# Patient Record
Sex: Female | Born: 1935 | Race: White | Hispanic: No | Smoking: Never smoker
Health system: Southern US, Community
[De-identification: ages and names within clinical notes are randomized; demographics above are authoritative.]

## PROBLEM LIST (undated history)

## (undated) DIAGNOSIS — Z9889 Other specified postprocedural states: Secondary | ICD-10-CM

## (undated) DIAGNOSIS — I447 Left bundle-branch block, unspecified: Secondary | ICD-10-CM

## (undated) DIAGNOSIS — R0789 Other chest pain: Secondary | ICD-10-CM

## (undated) DIAGNOSIS — N390 Urinary tract infection, site not specified: Secondary | ICD-10-CM

## (undated) DIAGNOSIS — K5730 Diverticulosis of large intestine without perforation or abscess without bleeding: Secondary | ICD-10-CM

## (undated) DIAGNOSIS — I10 Essential (primary) hypertension: Secondary | ICD-10-CM

## (undated) DIAGNOSIS — R112 Nausea with vomiting, unspecified: Secondary | ICD-10-CM

## (undated) DIAGNOSIS — I739 Peripheral vascular disease, unspecified: Secondary | ICD-10-CM

## (undated) DIAGNOSIS — IMO0002 Reserved for concepts with insufficient information to code with codable children: Secondary | ICD-10-CM

## (undated) DIAGNOSIS — I472 Ventricular tachycardia: Secondary | ICD-10-CM

## (undated) DIAGNOSIS — T8859XA Other complications of anesthesia, initial encounter: Secondary | ICD-10-CM

## (undated) DIAGNOSIS — E785 Hyperlipidemia, unspecified: Secondary | ICD-10-CM

## (undated) DIAGNOSIS — M1990 Unspecified osteoarthritis, unspecified site: Secondary | ICD-10-CM

## (undated) DIAGNOSIS — I2699 Other pulmonary embolism without acute cor pulmonale: Secondary | ICD-10-CM

## (undated) DIAGNOSIS — K529 Noninfective gastroenteritis and colitis, unspecified: Secondary | ICD-10-CM

## (undated) DIAGNOSIS — R000 Tachycardia, unspecified: Secondary | ICD-10-CM

## (undated) DIAGNOSIS — K219 Gastro-esophageal reflux disease without esophagitis: Secondary | ICD-10-CM

## (undated) DIAGNOSIS — E876 Hypokalemia: Secondary | ICD-10-CM

## (undated) DIAGNOSIS — T4145XA Adverse effect of unspecified anesthetic, initial encounter: Secondary | ICD-10-CM

## (undated) DIAGNOSIS — R9430 Abnormal result of cardiovascular function study, unspecified: Secondary | ICD-10-CM

## (undated) DIAGNOSIS — R0602 Shortness of breath: Secondary | ICD-10-CM

## (undated) DIAGNOSIS — M353 Polymyalgia rheumatica: Secondary | ICD-10-CM

## (undated) HISTORY — DX: Ventricular tachycardia: I47.2

## (undated) HISTORY — DX: Tachycardia, unspecified: R00.0

## (undated) HISTORY — PX: BACK SURGERY: SHX140

## (undated) HISTORY — PX: TOTAL ABDOMINAL HYSTERECTOMY: SHX209

## (undated) HISTORY — PX: TONSILLECTOMY: SUR1361

## (undated) HISTORY — DX: Diverticulosis of large intestine without perforation or abscess without bleeding: K57.30

## (undated) HISTORY — DX: Left bundle-branch block, unspecified: I44.7

## (undated) HISTORY — DX: Gastro-esophageal reflux disease without esophagitis: K21.9

## (undated) HISTORY — DX: Essential (primary) hypertension: I10

## (undated) HISTORY — DX: Hypokalemia: E87.6

## (undated) HISTORY — PX: VEIN LIGATION AND STRIPPING: SHX2653

## (undated) HISTORY — PX: CHOLECYSTECTOMY: SHX55

## (undated) HISTORY — PX: OTHER SURGICAL HISTORY: SHX169

## (undated) HISTORY — DX: Other chest pain: R07.89

## (undated) HISTORY — DX: Unspecified osteoarthritis, unspecified site: M19.90

## (undated) HISTORY — DX: Urinary tract infection, site not specified: N39.0

## (undated) HISTORY — DX: Polymyalgia rheumatica: M35.3

## (undated) HISTORY — DX: Reserved for concepts with insufficient information to code with codable children: IMO0002

## (undated) HISTORY — DX: Abnormal result of cardiovascular function study, unspecified: R94.30

## (undated) HISTORY — DX: Hyperlipidemia, unspecified: E78.5

## (undated) HISTORY — DX: Peripheral vascular disease, unspecified: I73.9

---

## 1991-12-20 HISTORY — PX: APPENDECTOMY: SHX54

## 1991-12-20 HISTORY — PX: OTHER SURGICAL HISTORY: SHX169

## 1991-12-20 HISTORY — PX: COLECTOMY: SHX59

## 1998-09-21 ENCOUNTER — Encounter: Payer: Self-pay | Admitting: Emergency Medicine

## 1998-09-21 ENCOUNTER — Inpatient Hospital Stay (HOSPITAL_COMMUNITY): Admission: EM | Admit: 1998-09-21 | Discharge: 1998-09-25 | Payer: Self-pay | Admitting: Emergency Medicine

## 1999-01-04 ENCOUNTER — Emergency Department (HOSPITAL_COMMUNITY): Admission: EM | Admit: 1999-01-04 | Discharge: 1999-01-05 | Payer: Self-pay | Admitting: Emergency Medicine

## 1999-01-05 ENCOUNTER — Encounter: Payer: Self-pay | Admitting: Emergency Medicine

## 1999-05-05 ENCOUNTER — Ambulatory Visit (HOSPITAL_COMMUNITY): Admission: RE | Admit: 1999-05-05 | Discharge: 1999-05-05 | Payer: Self-pay | Admitting: Gastroenterology

## 2000-05-19 LAB — FECAL OCCULT BLOOD, GUAIAC: Fecal Occult Blood: NEGATIVE

## 2000-11-22 ENCOUNTER — Ambulatory Visit (HOSPITAL_COMMUNITY): Admission: RE | Admit: 2000-11-22 | Discharge: 2000-11-22 | Payer: Self-pay | Admitting: Gastroenterology

## 2000-11-22 ENCOUNTER — Encounter (INDEPENDENT_AMBULATORY_CARE_PROVIDER_SITE_OTHER): Payer: Self-pay | Admitting: *Deleted

## 2002-09-26 ENCOUNTER — Encounter: Admission: RE | Admit: 2002-09-26 | Discharge: 2002-09-26 | Payer: Self-pay | Admitting: Gastroenterology

## 2002-09-26 ENCOUNTER — Encounter: Payer: Self-pay | Admitting: Gastroenterology

## 2002-12-04 ENCOUNTER — Ambulatory Visit (HOSPITAL_COMMUNITY): Admission: RE | Admit: 2002-12-04 | Discharge: 2002-12-04 | Payer: Self-pay | Admitting: Gastroenterology

## 2002-12-04 ENCOUNTER — Encounter (INDEPENDENT_AMBULATORY_CARE_PROVIDER_SITE_OTHER): Payer: Self-pay | Admitting: *Deleted

## 2003-12-20 HISTORY — PX: CARDIAC CATHETERIZATION: SHX172

## 2004-05-07 ENCOUNTER — Encounter: Admission: RE | Admit: 2004-05-07 | Discharge: 2004-05-07 | Payer: Self-pay | Admitting: Gastroenterology

## 2004-05-19 HISTORY — PX: ESOPHAGOGASTRODUODENOSCOPY: SHX1529

## 2004-07-06 ENCOUNTER — Ambulatory Visit (HOSPITAL_COMMUNITY): Admission: RE | Admit: 2004-07-06 | Discharge: 2004-07-06 | Payer: Self-pay | Admitting: Gastroenterology

## 2004-07-13 ENCOUNTER — Ambulatory Visit (HOSPITAL_COMMUNITY): Admission: RE | Admit: 2004-07-13 | Discharge: 2004-07-13 | Payer: Self-pay | Admitting: Gastroenterology

## 2004-09-23 ENCOUNTER — Observation Stay (HOSPITAL_COMMUNITY): Admission: EM | Admit: 2004-09-23 | Discharge: 2004-09-24 | Payer: Self-pay | Admitting: Emergency Medicine

## 2004-09-24 ENCOUNTER — Encounter: Payer: Self-pay | Admitting: Cardiology

## 2004-10-08 ENCOUNTER — Ambulatory Visit (HOSPITAL_COMMUNITY): Admission: RE | Admit: 2004-10-08 | Discharge: 2004-10-08 | Payer: Self-pay | Admitting: Cardiology

## 2004-10-18 ENCOUNTER — Encounter: Admission: RE | Admit: 2004-10-18 | Discharge: 2004-10-18 | Payer: Self-pay | Admitting: Surgery

## 2004-10-18 ENCOUNTER — Ambulatory Visit: Admission: RE | Admit: 2004-10-18 | Discharge: 2004-10-18 | Payer: Self-pay | Admitting: Cardiology

## 2004-10-18 LAB — HM MAMMOGRAPHY: HM Mammogram: NORMAL

## 2004-10-28 ENCOUNTER — Ambulatory Visit: Payer: Self-pay | Admitting: Cardiology

## 2004-11-23 ENCOUNTER — Ambulatory Visit: Payer: Self-pay | Admitting: Family Medicine

## 2004-12-27 ENCOUNTER — Ambulatory Visit: Payer: Self-pay | Admitting: Family Medicine

## 2004-12-29 ENCOUNTER — Ambulatory Visit: Payer: Self-pay | Admitting: Family Medicine

## 2005-01-17 ENCOUNTER — Ambulatory Visit (HOSPITAL_COMMUNITY): Admission: RE | Admit: 2005-01-17 | Discharge: 2005-01-17 | Payer: Self-pay | Admitting: Oncology

## 2005-01-25 ENCOUNTER — Ambulatory Visit: Payer: Self-pay | Admitting: Family Medicine

## 2005-04-18 ENCOUNTER — Ambulatory Visit: Payer: Self-pay | Admitting: Oncology

## 2005-04-19 ENCOUNTER — Ambulatory Visit (HOSPITAL_COMMUNITY): Admission: RE | Admit: 2005-04-19 | Discharge: 2005-04-19 | Payer: Self-pay | Admitting: Oncology

## 2005-06-14 ENCOUNTER — Ambulatory Visit: Payer: Self-pay | Admitting: Family Medicine

## 2005-06-16 ENCOUNTER — Ambulatory Visit: Payer: Self-pay | Admitting: Family Medicine

## 2005-07-21 ENCOUNTER — Ambulatory Visit: Payer: Self-pay | Admitting: Oncology

## 2005-08-04 ENCOUNTER — Ambulatory Visit (HOSPITAL_COMMUNITY): Admission: RE | Admit: 2005-08-04 | Discharge: 2005-08-04 | Payer: Self-pay | Admitting: Oncology

## 2005-10-05 ENCOUNTER — Ambulatory Visit: Payer: Self-pay | Admitting: Family Medicine

## 2005-12-26 ENCOUNTER — Ambulatory Visit: Payer: Self-pay | Admitting: Family Medicine

## 2005-12-29 ENCOUNTER — Ambulatory Visit: Payer: Self-pay | Admitting: Family Medicine

## 2006-03-09 ENCOUNTER — Ambulatory Visit: Payer: Self-pay | Admitting: Family Medicine

## 2006-04-04 ENCOUNTER — Ambulatory Visit: Payer: Self-pay | Admitting: Family Medicine

## 2006-10-03 ENCOUNTER — Ambulatory Visit: Payer: Self-pay | Admitting: Family Medicine

## 2006-10-10 ENCOUNTER — Ambulatory Visit: Payer: Self-pay | Admitting: Family Medicine

## 2007-01-08 ENCOUNTER — Ambulatory Visit: Payer: Self-pay | Admitting: Family Medicine

## 2007-01-08 LAB — CONVERTED CEMR LAB
Hgb A1c MFr Bld: 6.4 %
Hgb A1c MFr Bld: 6.4 % — ABNORMAL HIGH (ref 4.6–6.0)
Total CK: 44 units/L (ref 7–177)

## 2007-01-30 ENCOUNTER — Ambulatory Visit: Payer: Self-pay | Admitting: Family Medicine

## 2007-01-30 LAB — CONVERTED CEMR LAB
Anti Nuclear Antibody(ANA): NEGATIVE
Basophils Absolute: 0.1 10*3/uL (ref 0.0–0.1)
Basophils Relative: 1.3 % — ABNORMAL HIGH (ref 0.0–1.0)
Eosinophils Absolute: 0.2 10*3/uL (ref 0.0–0.6)
Eosinophils Relative: 1.6 % (ref 0.0–5.0)
HCT: 32.3 % — ABNORMAL LOW (ref 36.0–46.0)
Hemoglobin: 11 g/dL — ABNORMAL LOW (ref 12.0–15.0)
Lymphocytes Relative: 11.4 % — ABNORMAL LOW (ref 12.0–46.0)
MCHC: 34 g/dL (ref 30.0–36.0)
MCV: 87.2 fL (ref 78.0–100.0)
Monocytes Absolute: 0.4 10*3/uL (ref 0.2–0.7)
Monocytes Relative: 3.7 % (ref 3.0–11.0)
Neutro Abs: 7.7 10*3/uL (ref 1.4–7.7)
Neutrophils Relative %: 82 % — ABNORMAL HIGH (ref 43.0–77.0)
Platelets: 581 10*3/uL — ABNORMAL HIGH (ref 150–400)
RBC: 3.71 M/uL — ABNORMAL LOW (ref 3.87–5.11)
RDW: 12.9 % (ref 11.5–14.6)
Rheumatoid fact SerPl-aCnc: <20 intl units/mL — ABNORMAL LOW (ref 0.0–20.0)
Sed Rate: 114 mm/hr — ABNORMAL HIGH (ref 0–25)
WBC: 9.5 10*3/uL (ref 4.5–10.5)

## 2007-02-07 ENCOUNTER — Ambulatory Visit: Payer: Self-pay | Admitting: Family Medicine

## 2007-02-23 ENCOUNTER — Ambulatory Visit: Payer: Self-pay | Admitting: Family Medicine

## 2007-03-06 ENCOUNTER — Ambulatory Visit: Payer: Self-pay | Admitting: Family Medicine

## 2007-04-24 ENCOUNTER — Encounter: Payer: Self-pay | Admitting: Family Medicine

## 2007-04-25 ENCOUNTER — Encounter: Payer: Self-pay | Admitting: Family Medicine

## 2007-04-25 DIAGNOSIS — I10 Essential (primary) hypertension: Secondary | ICD-10-CM | POA: Insufficient documentation

## 2007-04-25 DIAGNOSIS — I739 Peripheral vascular disease, unspecified: Secondary | ICD-10-CM

## 2007-04-25 DIAGNOSIS — M353 Polymyalgia rheumatica: Secondary | ICD-10-CM | POA: Insufficient documentation

## 2007-04-25 DIAGNOSIS — M1990 Unspecified osteoarthritis, unspecified site: Secondary | ICD-10-CM

## 2007-04-25 DIAGNOSIS — K5730 Diverticulosis of large intestine without perforation or abscess without bleeding: Secondary | ICD-10-CM | POA: Insufficient documentation

## 2007-04-25 DIAGNOSIS — K589 Irritable bowel syndrome without diarrhea: Secondary | ICD-10-CM

## 2007-04-25 DIAGNOSIS — D126 Benign neoplasm of colon, unspecified: Secondary | ICD-10-CM

## 2007-04-25 DIAGNOSIS — E785 Hyperlipidemia, unspecified: Secondary | ICD-10-CM

## 2007-05-07 ENCOUNTER — Ambulatory Visit: Payer: Self-pay | Admitting: Family Medicine

## 2007-05-08 LAB — CONVERTED CEMR LAB
Basophils Absolute: 0.1 10*3/uL (ref 0.0–0.1)
Basophils Relative: 0.5 % (ref 0.0–1.0)
Eosinophils Absolute: 0.1 10*3/uL (ref 0.0–0.6)
Eosinophils Relative: 0.4 % (ref 0.0–5.0)
HCT: 37 % (ref 36.0–46.0)
Hemoglobin: 12.6 g/dL (ref 12.0–15.0)
Hgb A1c MFr Bld: 6.4 % — ABNORMAL HIGH (ref 4.6–6.0)
Lymphocytes Relative: 7.1 % — ABNORMAL LOW (ref 12.0–46.0)
MCHC: 34.1 g/dL (ref 30.0–36.0)
MCV: 87.3 fL (ref 78.0–100.0)
Monocytes Absolute: 0.4 10*3/uL (ref 0.2–0.7)
Monocytes Relative: 2 % — ABNORMAL LOW (ref 3.0–11.0)
Neutro Abs: 15.9 10*3/uL — ABNORMAL HIGH (ref 1.4–7.7)
Neutrophils Relative %: 90 % — ABNORMAL HIGH (ref 43.0–77.0)
Platelets: 384 10*3/uL (ref 150–400)
RBC: 4.24 M/uL (ref 3.87–5.11)
RDW: 15.6 % — ABNORMAL HIGH (ref 11.5–14.6)
WBC: 17.8 10*3/uL — ABNORMAL HIGH (ref 4.5–10.5)

## 2007-05-16 ENCOUNTER — Encounter: Admission: RE | Admit: 2007-05-16 | Discharge: 2007-05-16 | Payer: Self-pay | Admitting: Rheumatology

## 2007-05-16 ENCOUNTER — Encounter: Payer: Self-pay | Admitting: Family Medicine

## 2007-06-05 ENCOUNTER — Ambulatory Visit: Payer: Self-pay | Admitting: Family Medicine

## 2007-06-05 LAB — CONVERTED CEMR LAB
Casts: 0 /lpf
Yeast, UA: 0

## 2007-06-06 ENCOUNTER — Encounter: Payer: Self-pay | Admitting: Family Medicine

## 2007-06-12 ENCOUNTER — Encounter: Payer: Self-pay | Admitting: Family Medicine

## 2007-06-18 ENCOUNTER — Encounter: Payer: Self-pay | Admitting: Family Medicine

## 2007-07-03 ENCOUNTER — Encounter: Payer: Self-pay | Admitting: Family Medicine

## 2007-07-05 ENCOUNTER — Encounter: Payer: Self-pay | Admitting: Family Medicine

## 2007-07-06 ENCOUNTER — Encounter: Admission: RE | Admit: 2007-07-06 | Discharge: 2007-07-06 | Payer: Self-pay | Admitting: Physician Assistant

## 2007-07-25 ENCOUNTER — Ambulatory Visit: Payer: Self-pay | Admitting: Family Medicine

## 2007-07-25 LAB — CONVERTED CEMR LAB
Bilirubin Urine: NEGATIVE
Blood in Urine, dipstick: NEGATIVE
Glucose, Urine, Semiquant: NEGATIVE
Ketones, urine, test strip: NEGATIVE
Nitrite: NEGATIVE
Protein, U semiquant: NEGATIVE
Specific Gravity, Urine: 1.01
Urobilinogen, UA: 0.2
WBC Urine, dipstick: NEGATIVE
pH: 6

## 2007-08-02 ENCOUNTER — Encounter: Payer: Self-pay | Admitting: Family Medicine

## 2007-08-16 ENCOUNTER — Encounter: Payer: Self-pay | Admitting: Family Medicine

## 2007-08-22 ENCOUNTER — Ambulatory Visit: Payer: Self-pay | Admitting: Family Medicine

## 2007-08-22 ENCOUNTER — Telehealth: Payer: Self-pay | Admitting: Family Medicine

## 2007-08-22 LAB — CONVERTED CEMR LAB
Bilirubin Urine: NEGATIVE
Glucose, Urine, Semiquant: NEGATIVE
Ketones, urine, test strip: NEGATIVE
Nitrite: NEGATIVE
Protein, U semiquant: 100
Specific Gravity, Urine: >=1.03
Urobilinogen, UA: 0.2
pH: 6

## 2007-08-23 ENCOUNTER — Encounter: Payer: Self-pay | Admitting: Family Medicine

## 2007-09-18 ENCOUNTER — Encounter: Payer: Self-pay | Admitting: Family Medicine

## 2007-09-21 ENCOUNTER — Ambulatory Visit: Payer: Self-pay | Admitting: Family Medicine

## 2007-10-25 ENCOUNTER — Encounter: Payer: Self-pay | Admitting: Family Medicine

## 2007-12-24 ENCOUNTER — Ambulatory Visit: Payer: Self-pay | Admitting: Family Medicine

## 2008-01-04 ENCOUNTER — Encounter: Payer: Self-pay | Admitting: Family Medicine

## 2008-01-07 ENCOUNTER — Encounter: Payer: Self-pay | Admitting: Family Medicine

## 2008-02-21 ENCOUNTER — Encounter: Payer: Self-pay | Admitting: Family Medicine

## 2008-04-10 ENCOUNTER — Encounter: Payer: Self-pay | Admitting: Family Medicine

## 2008-06-23 ENCOUNTER — Ambulatory Visit: Payer: Self-pay | Admitting: Family Medicine

## 2008-06-23 DIAGNOSIS — R5381 Other malaise: Secondary | ICD-10-CM

## 2008-06-23 DIAGNOSIS — R5383 Other fatigue: Secondary | ICD-10-CM

## 2008-06-24 LAB — CONVERTED CEMR LAB
ALT: 15 units/L (ref 0–35)
AST: 17 units/L (ref 0–37)
Albumin: 3.6 g/dL (ref 3.5–5.2)
Alkaline Phosphatase: 55 units/L (ref 39–117)
BUN: 18 mg/dL (ref 6–23)
Basophils Absolute: 0 10*3/uL (ref 0.0–0.1)
Basophils Relative: 0 % (ref 0.0–1.0)
Bilirubin, Direct: 0.1 mg/dL (ref 0.0–0.3)
CO2: 29 meq/L (ref 19–32)
Calcium: 9 mg/dL (ref 8.4–10.5)
Chloride: 104 meq/L (ref 96–112)
Cholesterol: 209 mg/dL (ref 0–200)
Creatinine, Ser: 0.9 mg/dL (ref 0.4–1.2)
Direct LDL: 127.1 mg/dL
Eosinophils Absolute: 0.1 10*3/uL (ref 0.0–0.7)
Eosinophils Relative: 1.3 % (ref 0.0–5.0)
Folate: 13 ng/mL
GFR calc Af Amer: 77 mL/min
GFR calc non Af Amer: 63 mL/min
Glucose, Bld: 89 mg/dL (ref 70–99)
HCT: 35 % — ABNORMAL LOW (ref 36.0–46.0)
HDL: 60.1 mg/dL (ref >39.0)
Hemoglobin: 12 g/dL (ref 12.0–15.0)
Hgb A1c MFr Bld: 6.2 % — ABNORMAL HIGH (ref 4.6–6.0)
Lymphocytes Relative: 18.5 % (ref 12.0–46.0)
MCHC: 34.3 g/dL (ref 30.0–36.0)
MCV: 93.8 fL (ref 78.0–100.0)
Monocytes Absolute: 0.6 10*3/uL (ref 0.1–1.0)
Monocytes Relative: 7.8 % (ref 3.0–12.0)
Neutro Abs: 5.9 10*3/uL (ref 1.4–7.7)
Neutrophils Relative %: 72.4 % (ref 43.0–77.0)
Phosphorus: 3.5 mg/dL (ref 2.3–4.6)
Platelets: 310 10*3/uL (ref 150–400)
Potassium: 3.9 meq/L (ref 3.5–5.1)
RBC: 3.73 M/uL — ABNORMAL LOW (ref 3.87–5.11)
RDW: 13.6 % (ref 11.5–14.6)
Sed Rate: 47 mm/hr — ABNORMAL HIGH (ref 0–22)
Sodium: 142 meq/L (ref 135–145)
TSH: 0.92 microintl units/mL (ref 0.35–5.50)
Total Bilirubin: 0.7 mg/dL (ref 0.3–1.2)
Total CHOL/HDL Ratio: 3.5
Total Protein: 6.7 g/dL (ref 6.0–8.3)
Triglycerides: 96 mg/dL (ref 0–149)
VLDL: 19 mg/dL (ref 0–40)
Vitamin B-12: 383 pg/mL (ref 211–911)
WBC: 8.1 10*3/uL (ref 4.5–10.5)

## 2008-06-25 ENCOUNTER — Encounter: Payer: Self-pay | Admitting: Family Medicine

## 2008-06-25 LAB — CONVERTED CEMR LAB: Vit D, 1,25-Dihydroxy: 23 — ABNORMAL LOW (ref 30–89)

## 2008-07-03 ENCOUNTER — Ambulatory Visit: Payer: Self-pay | Admitting: Family Medicine

## 2008-07-03 DIAGNOSIS — B009 Herpesviral infection, unspecified: Secondary | ICD-10-CM | POA: Insufficient documentation

## 2008-07-03 LAB — CONVERTED CEMR LAB
Bilirubin Urine: NEGATIVE
Glucose, Urine, Semiquant: NEGATIVE
Ketones, urine, test strip: NEGATIVE
Nitrite: POSITIVE
Protein, U semiquant: 30
Specific Gravity, Urine: 1.02
Urobilinogen, UA: 0.2
pH: 5

## 2008-07-14 ENCOUNTER — Ambulatory Visit: Payer: Self-pay | Admitting: Family Medicine

## 2008-07-14 ENCOUNTER — Encounter (INDEPENDENT_AMBULATORY_CARE_PROVIDER_SITE_OTHER): Payer: Self-pay | Admitting: Internal Medicine

## 2008-09-10 ENCOUNTER — Ambulatory Visit: Payer: Self-pay | Admitting: Family Medicine

## 2008-09-10 DIAGNOSIS — E559 Vitamin D deficiency, unspecified: Secondary | ICD-10-CM

## 2008-09-11 ENCOUNTER — Encounter: Payer: Self-pay | Admitting: Family Medicine

## 2008-09-12 LAB — CONVERTED CEMR LAB
Albumin: 3.7 g/dL (ref 3.5–5.2)
BUN: 22 mg/dL (ref 6–23)
CO2: 27 meq/L (ref 19–32)
Calcium: 9.3 mg/dL (ref 8.4–10.5)
Chloride: 110 meq/L (ref 96–112)
Creatinine, Ser: 0.8 mg/dL (ref 0.4–1.2)
GFR calc Af Amer: 88 mL/min
GFR calc non Af Amer: 72 mL/min
Glucose, Bld: 97 mg/dL (ref 70–99)
Phosphorus: 4 mg/dL (ref 2.3–4.6)
Potassium: 4.1 meq/L (ref 3.5–5.1)
Sodium: 142 meq/L (ref 135–145)
Vit D, 1,25-Dihydroxy: 31 (ref 30–89)

## 2008-09-22 ENCOUNTER — Ambulatory Visit: Payer: Self-pay | Admitting: Family Medicine

## 2008-09-22 DIAGNOSIS — N390 Urinary tract infection, site not specified: Secondary | ICD-10-CM

## 2008-09-22 LAB — CONVERTED CEMR LAB
Bilirubin Urine: NEGATIVE
Blood in Urine, dipstick: NEGATIVE
Casts: 0 /lpf
Glucose, Urine, Semiquant: NEGATIVE
Ketones, urine, test strip: NEGATIVE
Nitrite: NEGATIVE
Protein, U semiquant: NEGATIVE
RBC / HPF: 0
Specific Gravity, Urine: 1.015
Urine crystals, microscopic: 0 /hpf
Urobilinogen, UA: 0.2
WBC, UA: 0 cells/hpf
Yeast, UA: 0
pH: 5

## 2008-09-23 ENCOUNTER — Encounter: Payer: Self-pay | Admitting: Family Medicine

## 2008-11-10 ENCOUNTER — Telehealth (INDEPENDENT_AMBULATORY_CARE_PROVIDER_SITE_OTHER): Payer: Self-pay | Admitting: *Deleted

## 2008-12-23 ENCOUNTER — Encounter: Payer: Self-pay | Admitting: Family Medicine

## 2008-12-31 ENCOUNTER — Ambulatory Visit: Payer: Self-pay | Admitting: Family Medicine

## 2008-12-31 DIAGNOSIS — I872 Venous insufficiency (chronic) (peripheral): Secondary | ICD-10-CM | POA: Insufficient documentation

## 2009-01-28 ENCOUNTER — Telehealth: Payer: Self-pay | Admitting: Family Medicine

## 2009-03-31 ENCOUNTER — Ambulatory Visit: Payer: Self-pay | Admitting: Family Medicine

## 2009-04-01 LAB — CONVERTED CEMR LAB
Albumin: 3.6 g/dL (ref 3.5–5.2)
BUN: 24 mg/dL — ABNORMAL HIGH (ref 6–23)
CO2: 27 meq/L (ref 19–32)
Calcium: 9.2 mg/dL (ref 8.4–10.5)
Chloride: 102 meq/L (ref 96–112)
Cholesterol: 217 mg/dL (ref 0–200)
Creatinine, Ser: 0.9 mg/dL (ref 0.4–1.2)
Direct LDL: 138.8 mg/dL
GFR calc Af Amer: 77 mL/min
GFR calc non Af Amer: 63 mL/min
Glucose, Bld: 95 mg/dL (ref 70–99)
HDL: 58.6 mg/dL (ref >39.0)
Hgb A1c MFr Bld: 6.1 % — ABNORMAL HIGH (ref 4.6–6.0)
Phosphorus: 3.6 mg/dL (ref 2.3–4.6)
Potassium: 3.9 meq/L (ref 3.5–5.1)
Sodium: 137 meq/L (ref 135–145)
Total CHOL/HDL Ratio: 3.7
Triglycerides: 104 mg/dL (ref 0–149)
VLDL: 21 mg/dL (ref 0–40)

## 2009-04-02 LAB — CONVERTED CEMR LAB
ALT: 14 units/L (ref 0–35)
Albumin: 3.7 g/dL (ref 3.5–5.2)
BUN: 29 mg/dL — ABNORMAL HIGH (ref 6–23)
CO2: 29 meq/L (ref 19–32)
Calcium: 9.1 mg/dL (ref 8.4–10.5)
Chloride: 108 meq/L (ref 96–112)
Cholesterol: 227 mg/dL — ABNORMAL HIGH (ref 0–200)
Creatinine, Ser: 0.9 mg/dL (ref 0.4–1.2)
Direct LDL: 135.3 mg/dL
Glucose, Bld: 98 mg/dL (ref 70–99)
HDL: 63.8 mg/dL (ref >39.00)
Hgb A1c MFr Bld: 6.1 % (ref 4.6–6.5)
Phosphorus: 4.1 mg/dL (ref 2.3–4.6)
Potassium: 3.9 meq/L (ref 3.5–5.1)
Sodium: 143 meq/L (ref 135–145)
Total CHOL/HDL Ratio: 4
Triglycerides: 71 mg/dL (ref 0.0–149.0)
VLDL: 14.2 mg/dL (ref 0.0–40.0)

## 2009-04-22 ENCOUNTER — Encounter: Payer: Self-pay | Admitting: Family Medicine

## 2009-07-01 ENCOUNTER — Ambulatory Visit: Payer: Self-pay | Admitting: Family Medicine

## 2009-08-13 ENCOUNTER — Ambulatory Visit: Payer: Self-pay | Admitting: Family Medicine

## 2009-08-13 LAB — CONVERTED CEMR LAB
Bilirubin Urine: NEGATIVE
Blood in Urine, dipstick: NEGATIVE
Casts: 0 /lpf
Glucose, Urine, Semiquant: NEGATIVE
Ketones, urine, test strip: NEGATIVE
Nitrite: NEGATIVE
Protein, U semiquant: NEGATIVE
Specific Gravity, Urine: 1.015
Urine crystals, microscopic: 0 /hpf
Urobilinogen, UA: 0.2
WBC Urine, dipstick: NEGATIVE
Yeast, UA: 0
pH: 6

## 2009-08-14 ENCOUNTER — Encounter: Payer: Self-pay | Admitting: Family Medicine

## 2009-08-14 LAB — CONVERTED CEMR LAB
ALT: 15 units/L (ref 0–35)
AST: 17 units/L (ref 0–37)
Albumin: 3.9 g/dL (ref 3.5–5.2)
BUN: 24 mg/dL — ABNORMAL HIGH (ref 6–23)
CO2: 29 meq/L (ref 19–32)
Calcium: 9.3 mg/dL (ref 8.4–10.5)
Chloride: 109 meq/L (ref 96–112)
Cholesterol: 171 mg/dL (ref 0–200)
Creatinine, Ser: 0.9 mg/dL (ref 0.4–1.2)
Glucose, Bld: 102 mg/dL — ABNORMAL HIGH (ref 70–99)
HDL: 67.1 mg/dL (ref >39.00)
Hgb A1c MFr Bld: 5.9 % (ref 4.6–6.5)
LDL Cholesterol: 89 mg/dL (ref 0–99)
Phosphorus: 4.2 mg/dL (ref 2.3–4.6)
Potassium: 4.3 meq/L (ref 3.5–5.1)
Sed Rate: 36 mm/hr — ABNORMAL HIGH (ref 0–22)
Sodium: 143 meq/L (ref 135–145)
Total CHOL/HDL Ratio: 3
Triglycerides: 74 mg/dL (ref 0.0–149.0)
VLDL: 14.8 mg/dL (ref 0.0–40.0)

## 2009-09-01 ENCOUNTER — Encounter: Payer: Self-pay | Admitting: Family Medicine

## 2009-09-29 ENCOUNTER — Ambulatory Visit: Payer: Self-pay | Admitting: Family Medicine

## 2009-10-11 ENCOUNTER — Inpatient Hospital Stay (HOSPITAL_COMMUNITY): Admission: EM | Admit: 2009-10-11 | Discharge: 2009-10-16 | Payer: Self-pay | Admitting: Emergency Medicine

## 2009-10-11 ENCOUNTER — Ambulatory Visit: Payer: Self-pay | Admitting: Cardiology

## 2009-10-15 ENCOUNTER — Encounter: Payer: Self-pay | Admitting: Family Medicine

## 2009-10-15 ENCOUNTER — Encounter (INDEPENDENT_AMBULATORY_CARE_PROVIDER_SITE_OTHER): Payer: Self-pay | Admitting: Orthopaedic Surgery

## 2009-10-27 ENCOUNTER — Ambulatory Visit: Payer: Self-pay | Admitting: Family Medicine

## 2009-10-27 DIAGNOSIS — N289 Disorder of kidney and ureter, unspecified: Secondary | ICD-10-CM | POA: Insufficient documentation

## 2009-10-28 ENCOUNTER — Encounter: Payer: Self-pay | Admitting: Family Medicine

## 2009-10-29 LAB — CONVERTED CEMR LAB
Albumin: 3.5 g/dL (ref 3.5–5.2)
BUN: 16 mg/dL (ref 6–23)
Basophils Absolute: 0.1 10*3/uL (ref 0.0–0.1)
Basophils Relative: 0.6 % (ref 0.0–3.0)
CO2: 25 meq/L (ref 19–32)
Calcium: 9.7 mg/dL (ref 8.4–10.5)
Chloride: 102 meq/L (ref 96–112)
Creatinine, Ser: 1.1 mg/dL (ref 0.4–1.2)
Eosinophils Absolute: 0.2 10*3/uL (ref 0.0–0.7)
Eosinophils Relative: 2.3 % (ref 0.0–5.0)
GFR calc non Af Amer: 49.94 mL/min (ref >60)
Glucose, Bld: 83 mg/dL (ref 70–99)
HCT: 32.8 % — ABNORMAL LOW (ref 36.0–46.0)
Hemoglobin: 11.3 g/dL — ABNORMAL LOW (ref 12.0–15.0)
Lymphocytes Relative: 19 % (ref 12.0–46.0)
Lymphs Abs: 1.8 10*3/uL (ref 0.7–4.0)
MCHC: 34.3 g/dL (ref 30.0–36.0)
MCV: 95.9 fL (ref 78.0–100.0)
Monocytes Absolute: 0.5 10*3/uL (ref 0.1–1.0)
Monocytes Relative: 5 % (ref 3.0–12.0)
Neutro Abs: 7.1 10*3/uL (ref 1.4–7.7)
Neutrophils Relative %: 73.1 % (ref 43.0–77.0)
Phosphorus: 4.7 mg/dL — ABNORMAL HIGH (ref 2.3–4.6)
Platelets: 536 10*3/uL — ABNORMAL HIGH (ref 150.0–400.0)
Potassium: 4.1 meq/L (ref 3.5–5.1)
RBC: 3.43 M/uL — ABNORMAL LOW (ref 3.87–5.11)
RDW: 14 % (ref 11.5–14.6)
Sodium: 139 meq/L (ref 135–145)
WBC: 9.7 10*3/uL (ref 4.5–10.5)

## 2009-10-30 ENCOUNTER — Encounter: Payer: Self-pay | Admitting: *Deleted

## 2009-11-10 ENCOUNTER — Ambulatory Visit: Payer: Self-pay | Admitting: Family Medicine

## 2009-11-11 ENCOUNTER — Telehealth: Payer: Self-pay | Admitting: Family Medicine

## 2009-11-17 ENCOUNTER — Encounter: Payer: Self-pay | Admitting: Family Medicine

## 2009-12-01 ENCOUNTER — Ambulatory Visit: Payer: Self-pay | Admitting: Urology

## 2009-12-01 ENCOUNTER — Encounter: Payer: Self-pay | Admitting: Family Medicine

## 2010-01-19 LAB — HM DIABETES EYE EXAM: HM Diabetic Eye Exam: NORMAL

## 2010-01-22 ENCOUNTER — Telehealth: Payer: Self-pay | Admitting: Family Medicine

## 2010-02-08 ENCOUNTER — Ambulatory Visit: Payer: Self-pay | Admitting: Family Medicine

## 2010-02-08 DIAGNOSIS — D649 Anemia, unspecified: Secondary | ICD-10-CM

## 2010-02-09 LAB — CONVERTED CEMR LAB
Albumin: 3.7 g/dL (ref 3.5–5.2)
BUN: 19 mg/dL (ref 6–23)
Basophils Absolute: 0 10*3/uL (ref 0.0–0.1)
Chloride: 109 meq/L (ref 96–112)
Cholesterol: 223 mg/dL — ABNORMAL HIGH (ref 0–200)
Creatinine, Ser: 0.9 mg/dL (ref 0.4–1.2)
Direct LDL: 147.7 mg/dL
GFR calc non Af Amer: 62.92 mL/min (ref >60)
Glucose, Bld: 97 mg/dL (ref 70–99)
HCT: 35.3 % — ABNORMAL LOW (ref 36.0–46.0)
HDL: 67 mg/dL (ref >39.00)
Hemoglobin: 12 g/dL (ref 12.0–15.0)
Hgb A1c MFr Bld: 6.1 % (ref 4.6–6.5)
Lymphs Abs: 1.8 10*3/uL (ref 0.7–4.0)
MCHC: 34.1 g/dL (ref 30.0–36.0)
MCV: 92.9 fL (ref 78.0–100.0)
Monocytes Absolute: 0.7 10*3/uL (ref 0.1–1.0)
Monocytes Relative: 10.8 % (ref 3.0–12.0)
Neutro Abs: 3.9 10*3/uL (ref 1.4–7.7)
Phosphorus: 4.2 mg/dL (ref 2.3–4.6)
Platelets: 275 10*3/uL (ref 150.0–400.0)
RDW: 13.2 % (ref 11.5–14.6)
Total CHOL/HDL Ratio: 3
Triglycerides: 122 mg/dL (ref 0.0–149.0)
VLDL: 24.4 mg/dL (ref 0.0–40.0)

## 2010-02-11 ENCOUNTER — Ambulatory Visit: Payer: Self-pay | Admitting: Family Medicine

## 2010-02-11 LAB — HM DIABETES FOOT EXAM

## 2010-03-25 ENCOUNTER — Ambulatory Visit: Payer: Self-pay | Admitting: Family Medicine

## 2010-03-25 ENCOUNTER — Telehealth: Payer: Self-pay | Admitting: Family Medicine

## 2010-03-25 LAB — CONVERTED CEMR LAB
ALT: 14 units/L (ref 0–35)
AST: 20 units/L (ref 0–37)
Direct LDL: 130 mg/dL
HDL: 65.8 mg/dL (ref >39.00)

## 2010-04-05 ENCOUNTER — Telehealth: Payer: Self-pay | Admitting: Family Medicine

## 2010-04-07 ENCOUNTER — Telehealth: Payer: Self-pay | Admitting: Family Medicine

## 2010-04-26 ENCOUNTER — Ambulatory Visit: Payer: Self-pay | Admitting: Family Medicine

## 2010-04-26 LAB — CONVERTED CEMR LAB
Bilirubin Urine: NEGATIVE
Epithelial cells, urine: 1 /lpf
Nitrite: POSITIVE
Specific Gravity, Urine: 1.015
Urobilinogen, UA: 0.2
Yeast, UA: 0

## 2010-04-28 ENCOUNTER — Encounter: Payer: Self-pay | Admitting: Family Medicine

## 2010-05-05 ENCOUNTER — Encounter: Payer: Self-pay | Admitting: Family Medicine

## 2010-05-10 ENCOUNTER — Encounter: Payer: Self-pay | Admitting: Family Medicine

## 2010-05-11 ENCOUNTER — Ambulatory Visit: Payer: Self-pay | Admitting: Family Medicine

## 2010-05-12 ENCOUNTER — Telehealth: Payer: Self-pay | Admitting: Family Medicine

## 2010-06-07 ENCOUNTER — Telehealth: Payer: Self-pay | Admitting: Family Medicine

## 2010-06-25 ENCOUNTER — Ambulatory Visit: Payer: Self-pay | Admitting: Family Medicine

## 2010-06-25 LAB — CONVERTED CEMR LAB
Bilirubin Urine: NEGATIVE
Casts: 0 /lpf
Glucose, Urine, Semiquant: NEGATIVE
Specific Gravity, Urine: 1.01
Yeast, UA: 0
pH: 6

## 2010-07-02 ENCOUNTER — Ambulatory Visit: Payer: Self-pay | Admitting: Family Medicine

## 2010-08-16 ENCOUNTER — Ambulatory Visit: Payer: Self-pay | Admitting: Family Medicine

## 2010-08-16 ENCOUNTER — Telehealth: Payer: Self-pay | Admitting: Family Medicine

## 2010-08-16 LAB — CONVERTED CEMR LAB
Bilirubin Urine: NEGATIVE
Glucose, Urine, Semiquant: NEGATIVE
Ketones, urine, test strip: NEGATIVE
Nitrite: POSITIVE
Specific Gravity, Urine: 1.015
Urine crystals, microscopic: 0 /hpf
pH: 6

## 2010-09-03 ENCOUNTER — Ambulatory Visit: Payer: Self-pay | Admitting: Family Medicine

## 2010-09-03 LAB — CONVERTED CEMR LAB
Bilirubin Urine: NEGATIVE
Glucose, Urine, Semiquant: NEGATIVE
Yeast, UA: 0
pH: 5

## 2010-09-20 ENCOUNTER — Ambulatory Visit: Payer: Self-pay | Admitting: Internal Medicine

## 2010-09-20 LAB — CONVERTED CEMR LAB
Glucose, Urine, Semiquant: NEGATIVE
Ketones, urine, test strip: NEGATIVE
Nitrite: POSITIVE
Specific Gravity, Urine: 1.02
pH: 5

## 2010-09-21 ENCOUNTER — Encounter: Payer: Self-pay | Admitting: Family Medicine

## 2010-09-27 ENCOUNTER — Ambulatory Visit: Payer: Self-pay | Admitting: Internal Medicine

## 2010-10-03 ENCOUNTER — Emergency Department (HOSPITAL_COMMUNITY): Admission: EM | Admit: 2010-10-03 | Discharge: 2010-10-03 | Payer: Self-pay | Admitting: Emergency Medicine

## 2010-10-05 ENCOUNTER — Encounter: Payer: Self-pay | Admitting: Family Medicine

## 2010-11-17 ENCOUNTER — Ambulatory Visit: Payer: Self-pay | Admitting: Family Medicine

## 2010-11-17 ENCOUNTER — Encounter: Payer: Self-pay | Admitting: Family Medicine

## 2010-11-17 DIAGNOSIS — R739 Hyperglycemia, unspecified: Secondary | ICD-10-CM

## 2010-11-17 DIAGNOSIS — R079 Chest pain, unspecified: Secondary | ICD-10-CM | POA: Insufficient documentation

## 2010-11-18 ENCOUNTER — Encounter: Payer: Self-pay | Admitting: Cardiology

## 2010-11-19 ENCOUNTER — Ambulatory Visit: Payer: Self-pay | Admitting: Cardiology

## 2010-12-01 ENCOUNTER — Encounter: Payer: Self-pay | Admitting: Family Medicine

## 2010-12-01 ENCOUNTER — Telehealth: Payer: Self-pay | Admitting: Family Medicine

## 2010-12-09 ENCOUNTER — Telehealth: Payer: Self-pay | Admitting: Family Medicine

## 2010-12-09 ENCOUNTER — Encounter: Payer: Self-pay | Admitting: Family Medicine

## 2010-12-10 ENCOUNTER — Encounter: Payer: Self-pay | Admitting: Family Medicine

## 2010-12-30 ENCOUNTER — Encounter: Payer: Self-pay | Admitting: Family Medicine

## 2011-01-18 NOTE — Assessment & Plan Note (Signed)
Summary: ?UTI/CLE   Vital Signs:  Patient profile:   77 year old female Height:      62 inches Weight:      154.75 pounds BMI:     28.41 Temp:     97.5 degrees F oral Pulse rate:   64 / minute Pulse rhythm:   regular BP sitting:   140 / 74  (left arm) Cuff size:   regular  Vitals Entered By: Lewanda Rife LPN (June 25, 1609 12:07 PM) CC: ?UTI, low back pain and frequency of urine on and off.   History of Present Illness: started getting symptoms of uti worse since the 15th of june  had a lot going on  took some old sulfa drug even though she was allergic to it -- itching - but it made her feel better   now symptoms are back  low back really sore  a little burning after she urinates  bladder is hurting too  no blood in urine   was chilled last night -- thought she may have had a fever  some nausea , no vomiting   Allergies: 1)  ! Metformin Hcl 2)  ! Zocor 3)  ! Septra Ds (Sulfamethoxazole-Trimethoprim) 4)  Erythromycin 5)  Tetracycline 6)  Flagyl 7)  Vioxx 8)  Nsaids 9)  * Robitussin 10)  Macrodantin 11)  Flagyl  Past History:  Past Medical History: Last updated: 03/25/2010 Diabetes mellitus, type II Diverticulosis, colon GERD Hyperlipidemia- pt declines tx due to age Hypertension Osteoarthritis Peripheral vascular disease PMR frequent utis  urol -- Dr Aldean Ast card- Hochrein (consulted in hosp)   Past Surgical History: Last updated: 10/27/2009 Appendectomy(1993) Cholecystectomy Colectomy- partial (1993) Hysterectomy- fibroids (1960's)- total Vein stripping Bladder tack up Back surgery Colonoscopy (04/1999) EGD- Barretts esophagus, stomach polyps (11/2002) EGD / Colonoscopy (05/2004) Cardiac cath- minimal CAD (09/2004) CT chest- nodes (09/2004) hosp bilat pyelonephritis, chest pain, hypokalemia 10/10 2D echo septal and apical akinesis   Family History: Last updated: 04/25/2007 mother CAD, HTN, CVA b- heart probs b-DM s-pancreatic  ca  Social History: Last updated: 06/23/2008 non smoker no alcohol no regular exercise   Review of Systems General:  Complains of chills; denies fever, loss of appetite, and malaise. Eyes:  Denies blurring and eye irritation. CV:  Denies chest pain or discomfort, lightheadness, and palpitations. Resp:  Denies cough, shortness of breath, and wheezing. GI:  Complains of nausea; denies abdominal pain, bloody stools, diarrhea, and vomiting. GU:  Complains of dysuria and urinary frequency; denies hematuria. MS:  Complains of low back pain. Derm:  Denies itching, lesion(s), poor wound healing, and rash.  Physical Exam  General:  Well-developed,well-nourished,in no acute distress; alert,appropriate and cooperative throughout examination Head:  normocephalic, atraumatic, and no abnormalities observed.   Mouth:  pharynx pink and moist.   Neck:  supple with full rom and no masses or thyromegally, no JVD or carotid bruit  Lungs:  Normal respiratory effort, chest expands symmetrically. Lungs are clear to auscultation, no crackles or wheezes. Heart:  Normal rate and regular rhythm. S1 and S2 normal without gallop, murmur, click, rub or other extra sounds. Abdomen:  mild suprapubic tenderness no rebound or gaurding   Msk:  no CVA tenderness points to R lower back as area of pain Skin:  Intact without suspicious lesions or rashes Inguinal Nodes:  No significant adenopathy Psych:  normal affect, talkative and pleasant    Impression & Recommendations:  Problem # 1:  UTI'S, RECURRENT (ICD-599.0) Assessment Deteriorated urologist left his  practice - had a neg w/u disc hygiene in terms of fecal incontinence  wil try cranberry tabs otc tx this infx with cipro and update ucx pend  red flags reviewed to call back - worse back pain or fever The following medications were removed from the medication list:    Cipro 250 Mg Tabs (Ciprofloxacin hcl) .Marland Kitchen... 1 by mouth two times a day for 7 days     Cipro 250 Mg Tabs (Ciprofloxacin hcl) .Marland Kitchen... Take one tablet by mouth two times a day at onset of uti for 3 days. Her updated medication list for this problem includes:    Cipro 250 Mg Tabs (Ciprofloxacin hcl) .Marland Kitchen... 1 by mouth two times a day for 7 days  Orders: T-Culture, Urine ([id number]) Prescription Created Electronically [phone]) UA Dipstick W/ Micro (manual) (21308)  Complete Medication List: 1)  Diovan 80 Mg Tabs (Valsartan) .... Take one by mouth daily 2)  Protonix 40 Mg Tbec (Pantoprazole sodium) .... Take 1 by mouth two times a day 3)  Icaps Lutein-zeaxanthin Tbcr (Specialty vitamins products) .... Take two by mouth daily 4)  Vitamin D 1000 Unit Tabs (Cholecalciferol) .... One by mouth daily 5)  4 Prone Cane For Assistance With Gait  .... To use for oa knee 715.90 use as directed for walking 6)  Align Caps (Misc intestinal flora regulat) .... Daily as directed 7)  Aspirin 81 Mg Tbec (Aspirin) .... Take one by mouth daily 8)  Metoprolol Tartrate 25 Mg Tabs (Metoprolol tartrate) .... Take one by mouth two times a day 9)  Nitrostat 0.4 Mg Subl (Nitroglycerin) .... Take sl as directed as needed 10)  Fish Oil 1000 Mg Caps (Omega-3 fatty acids) .... Take one by mouth daily 11)  Hydrochlorothiazide 25 Mg Tabs (Hydrochlorothiazide) .Marland Kitchen.. 1 by mouth once daily 12)  Fosamax 70 Mg Tabs (Alendronate sodium) .... Take one tablet weekly 13)  Ultram 50 Mg Tabs (Tramadol hcl) .... Take one tablet two times a day as needed 14)  Cipro 250 Mg Tabs (Ciprofloxacin hcl) .Marland Kitchen.. 1 by mouth two times a day for 7 days  Patient Instructions: 1)  continue drinking lots of water 2)  call or seek care is symptoms don't improve in 2-3 days or if you develop back pain, nausea, or vomiting 3)  take the cipro as directed  4)  we will let you know when culture returns  5)  you can try cranberry tablets as directed over the counter  Prescriptions: CIPRO 250 MG TABS (CIPROFLOXACIN HCL) 1 by mouth two times a day  for 7 days  #14 x 0   Entered and Authorized by:   Judith Part MD   Signed by:   Judith Part MD on 06/25/2010   Method used:   Electronically to        [facility]* (retail)       [address]       [place], [place]  [address]       Ph: [phone]       Fax: [phone]   RxID:   [id number]   Current Allergies (reviewed today): ! METFORMIN HCL ! ZOCOR ! SEPTRA DS (SULFAMETHOXAZOLE-TRIMETHOPRIM) ERYTHROMYCIN TETRACYCLINE FLAGYL VIOXX NSAIDS * ROBITUSSIN MACRODANTIN FLAGYL  Laboratory Results   Urine Tests  Date/Time Received: June 25, 2010 12:08 PM  Date/Time Reported: June 25, 2010 12:09 PM   Routine Urinalysis   Color: yellow Appearance: Cloudy Glucose: negative   (Normal Range: Negative) Bilirubin: negative   (Normal Range:  Negative) Ketone: negative   (Normal Range: Negative) Spec. Gravity: 1.010   (Normal Range: 1.003-1.035) Blood: trace-intact   (Normal Range: Negative) pH: 6.0   (Normal Range: 5.0-8.0) Protein: trace   (Normal Range: Negative) Urobilinogen: 0.2   (Normal Range: 0-1) Nitrite: positive   (Normal Range: Negative) Leukocyte Esterace: moderate   (Normal Range: Negative)  Urine Microscopic WBC/HPF: 4-5 RBC/HPF: 2-3 Bacteria/HPF: many Mucous/HPF: few Epithelial/HPF: 1-2 Crystals/HPF: few Casts/LPF: 0 Yeast/HPF: 0 Other: 0        Laboratory Results   Urine Tests    Routine Urinalysis   Color: yellow Appearance: Cloudy Glucose: negative   (Normal Range: Negative) Bilirubin: negative   (Normal Range: Negative) Ketone: negative   (Normal Range: Negative) Spec. Gravity: 1.010   (Normal Range: 1.003-1.035) Blood: trace-intact   (Normal Range: Negative) pH: 6.0   (Normal Range: 5.0-8.0) Protein: trace   (Normal Range: Negative) Urobilinogen: 0.2   (Normal Range: 0-1) Nitrite: positive   (Normal Range: Negative) Leukocyte Esterace: moderate   (Normal Range: Negative)  Urine Microscopic WBC/hpf: 4-5 RBC/hpf:  2-3 Bacteria: many Mucous: few Epithelial: 1-2 Crystals/LPF: few Casts/LPF: 0 Yeast/HPF: 0 Other: 0

## 2011-01-18 NOTE — Progress Notes (Signed)
Summary: uti  Phone Note Call from Patient Call back at [phone]   Caller: Daughter- [name] Call For: Ellen Part MD Summary of Call: Patient has been having reacurring UITs. Daughter came in saying that mother feels like she is getting another one. She is burning when urinating and feels alot of pressure. She wants to know if she can have rx called in to [facility].  Initial call taken by: Melody Comas,  May 12, 2010 9:43 AM  Follow-up for Phone Call        bring in sample please first - thanks  can pick up a cup or boil a container to sterilize it before collecting  Follow-up by: Ellen Part MD,  May 12, 2010 9:47 AM  Additional Follow-up for Phone Call Additional follow up Details #1::        Called [name] to advise her to bring in sample. No answer. LMOM for her to call us back.  Melody Comas  May 12, 2010 11:11 AM  Advised pt's daughter.         [name] CMA  May 12, 2010 12:55 PM

## 2011-01-18 NOTE — Miscellaneous (Signed)
Summary: Cipro 250mg  rx med list update  Medications Added CIPRO 250 MG TABS (CIPROFLOXACIN HCL) take one tablet by mouth two times a day at onset of uti for 3 days.       Clinical Lists Changes  Medications: Added new medication of CIPRO 250 MG TABS (CIPROFLOXACIN HCL) take one tablet by mouth two times a day at onset of uti for 3 days.     Current Allergies: ! METFORMIN HCL ! ZOCOR ! SEPTRA DS (SULFAMETHOXAZOLE-TRIMETHOPRIM) ERYTHROMYCIN TETRACYCLINE FLAGYL VIOXX NSAIDS * ROBITUSSIN MACRODANTIN FLAGYL

## 2011-01-18 NOTE — Assessment & Plan Note (Signed)
Summary: URINE FOR CULTURE PER DR TOWER/RI  pt's daughter noted that she finished abx and symptoms are better but not totally gone  I inst her to bring urine for cx in sterile cup  will cx and update with result Nurse Visit  Patient notified as instructed by telephone. Pt's daughter will wait to hear from urine culture.Lewanda Rife LPN  July 02, 2010 5:02 PM    Allergies: 1)  ! Metformin Hcl 2)  ! Zocor 3)  ! Septra Ds (Sulfamethoxazole-Trimethoprim) 4)  Erythromycin 5)  Tetracycline 6)  Flagyl 7)  Vioxx 8)  Nsaids 9)  * Robitussin 10)  Macrodantin 11)  Flagyl  Orders Added: 1)  T-Culture, Urine [[id number]] 2)  Specimen Handling [99000]

## 2011-01-18 NOTE — Assessment & Plan Note (Signed)
Summary: HAND PAIN/SOB WHEN WALKING/RBH   Vital Signs:  Patient profile:   77 year old female Height:      62 inches Weight:      155.75 pounds BMI:     28.59 Temp:     97.6 degrees F oral Pulse rate:   68 / minute Pulse rhythm:   regular BP sitting:   136 / 80  (left arm) Cuff size:   large  Vitals Entered By: Lewanda Rife LPN (November 17, 2010 4:18 PM) CC: both hands hurt, chest pain and tightness and both arms hurt on and off. SOB on and off when walking. Feels weak and tired. Pt said not hurting right now in chest.   History of Present Illness: symptoms started off and on for over a year   has resisted heart work up in the past - but now is agreeable to it   some chest pressure -- and tightness -- over whole chest  arms and hands hurt  had this once in hospital - saw cardiologist 1 year ago and given beta blocker and nitro  sometimes symptoms are exertional and at other times not in general feeling weak -- like her legs get tired easily -- since sunday  had been a bit nauseated in ams  no sweating -- but has had hot flashes at times   had cardiac cath in 05 -- fairly normal   now on enablex and also cephalexin from urologist  overall is doing well from that  no new stress mood has been fine     Allergies: 1)  ! Metformin Hcl 2)  ! Zocor 3)  ! Septra Ds (Sulfamethoxazole-Trimethoprim) 4)  Erythromycin 5)  Tetracycline 6)  Flagyl 7)  Vioxx 8)  Nsaids 9)  * Robitussin 10)  Macrodantin 11)  Flagyl  Past History:  Past Medical History: Last updated: 09/27/2010 Diabetes mellitus, type II Diverticulosis, colon GERD Hyperlipidemia- pt declines tx due to age Hypertension Osteoarthritis Peripheral vascular disease PMR frequent utis  urol -- Dr Kimbrough/Humphries card- Hochrein (consulted in hosp)   Past Surgical History: Last updated: 10/27/2009 Appendectomy(1993) Cholecystectomy Colectomy- partial (1993) Hysterectomy- fibroids (1960's)-  total Vein stripping Bladder tack up Back surgery Colonoscopy (04/1999) EGD- Barretts esophagus, stomach polyps (11/2002) EGD / Colonoscopy (05/2004) Cardiac cath- minimal CAD (09/2004) CT chest- nodes (09/2004) hosp bilat pyelonephritis, chest pain, hypokalemia 10/10 2D echo septal and apical akinesis   Family History: Last updated: 04/25/2007 mother CAD, HTN, CVA b- heart probs b-DM s-pancreatic ca  Social History: Last updated: 06/23/2008 non smoker no alcohol no regular exercise   Review of Systems General:  Complains of fatigue; denies malaise and sweats. Eyes:  Denies blurring and eye irritation. CV:  Complains of chest pain or discomfort; denies lightheadness, palpitations, shortness of breath with exertion, and swelling of feet. Resp:  Denies cough, shortness of breath, and wheezing. GI:  Complains of nausea; denies abdominal pain, bloody stools, change in bowel habits, indigestion, and vomiting. GU:  Denies dysuria and urinary frequency. MS:  Complains of stiffness; denies muscle aches, cramps, and muscle weakness. Derm:  Denies itching, lesion(s), poor wound healing, and rash. Neuro:  Denies headaches, numbness, and tingling. Psych:  mood has been generally ok . Endo:  Denies cold intolerance, excessive thirst, excessive urination, and heat intolerance. Heme:  Denies abnormal bruising and bleeding.  Physical Exam  General:  elderly female - well but generally fatigued appearing Head:  normocephalic, atraumatic, and no abnormalities observed.   Eyes:  vision grossly  intact, pupils equal, pupils round, and pupils reactive to light.  no conjunctival pallor, injection or icterus  Mouth:  pharynx pink and moist.   Neck:  supple with full rom and no masses or thyromegally, no JVD or carotid bruit  Chest Wall:  some mild ant cw tenderness without skin change or crepitice  Lungs:  Normal respiratory effort, chest expands symmetrically. Lungs are clear to auscultation,  no crackles or wheezes. Heart:  Normal rate and regular rhythm. S1 and S2 normal without gallop, murmur, click, rub or other extra sounds. Abdomen:  Bowel sounds positive,abdomen soft and non-tender without masses, organomegaly or hernias noted. no renal bruits  Msk:  no cva tenderness Pulses:  2+ rad Extremities:  no edema Neurologic:  sensation intact to light touch, gait normal, and DTRs symmetrical and normal.   Skin:  Intact without suspicious lesions or rashes no jaundice or pallor Cervical Nodes:  No lymphadenopathy noted Psych:  normal affect-- acting her usual self   Impression & Recommendations:  Problem # 1:  CHEST PAIN (ICD-786.50) Assessment New at times exertional and at times atypical some fatigue  asymptomatic today  EKG shows baseline LBBB last cath was 05- fairly clear  disc poss of angina will ref to cardiol-- pt is finally willing to go for eval and family is thankful aware if symptoms return to go to er (pt and family voiced understanding) Orders: Cardiology Referral (Cardiology) EKG w/ Interpretation (93000)  Complete Medication List: 1)  Diovan 80 Mg Tabs (Valsartan) .... Take one by mouth daily 2)  Protonix 40 Mg Tbec (Pantoprazole sodium) .... Take 1 by mouth two times a day 3)  Icaps Lutein-zeaxanthin Tbcr (Specialty vitamins products) .... Take two by mouth daily 4)  Vitamin D 1000 Unit Tabs (Cholecalciferol) .... One by mouth daily 5)  4 Prone Cane For Assistance With Gait  .... To use for oa knee 715.90 use as directed for walking 6)  Aspirin 81 Mg Tbec (Aspirin) .... Take one by mouth daily 7)  Metoprolol Tartrate 25 Mg Tabs (Metoprolol tartrate) .... Take one by mouth two times a day 8)  Nitrostat 0.4 Mg Subl (Nitroglycerin) .... Take sl as directed as needed 9)  Fish Oil 1000 Mg Caps (Omega-3 fatty acids) .... Take one by mouth daily 10)  Hydrochlorothiazide 25 Mg Tabs (Hydrochlorothiazide) .Marland Kitchen.. 1 by mouth once daily 11)  Ultram 50 Mg Tabs  (Tramadol hcl) .... Take one tablet two times a day as needed 12)  Salsalate 500 Mg Tabs (Salsalate) .... One tablet by mouth two times a day. 13)  Keflex 250 Mg Caps (Cephalexin) .... Take 1 capsule by mouth once a day 14)  Enablex 7.5 Mg Xr24h-tab (Darifenacin hydrobromide) .... Take 1 tablet by mouth once a day  Patient Instructions: 1)  stop the fosamax if it makes you sick 2)  stop align if it does not help  3)  if symptoms worsen in chest or short of breath- go to the emergency room  4)  we will do cardiology consult at check out    Orders Added: 1)  Cardiology Referral [Cardiology] 2)  Est. Patient Level IV [16109] 3)  EKG w/ Interpretation [93000]    Current Allergies (reviewed today): ! METFORMIN HCL ! ZOCOR ! SEPTRA DS (SULFAMETHOXAZOLE-TRIMETHOPRIM) ERYTHROMYCIN TETRACYCLINE FLAGYL VIOXX NSAIDS * ROBITUSSIN MACRODANTIN FLAGYL  EKG  Procedure date:  11/17/2010  Findings:      LBBB --unchanged from previous

## 2011-01-18 NOTE — Assessment & Plan Note (Signed)
Summary: uti/alc per dr. Milinda Antis   Nurse Visit   Allergies: 1)  ! Metformin Hcl 2)  ! Zocor 3)  ! Septra Ds (Sulfamethoxazole-Trimethoprim) 4)  Erythromycin 5)  Tetracycline 6)  Flagyl 7)  Vioxx 8)  Nsaids 9)  * Robitussin 10)  Macrodantin 11)  Flagyl Laboratory Results   Urine Tests  Date/Time Received: August 16, 2010 10:11 AM  Date/Time Reported: August 16, 2010 10:11 AM   Routine Urinalysis   Color: yellow Appearance: Cloudy Glucose: negative   (Normal Range: Negative) Bilirubin: negative   (Normal Range: Negative) Ketone: negative   (Normal Range: Negative) Spec. Gravity: 1.015   (Normal Range: 1.003-1.035) Blood: small   (Normal Range: Negative) pH: 6.0   (Normal Range: 5.0-8.0) Protein: trace   (Normal Range: Negative) Urobilinogen: 0.2   (Normal Range: 0-1) Nitrite: positive   (Normal Range: Negative) Leukocyte Esterace: small   (Normal Range: Negative)  Urine Microscopic WBC/HPF: many RBC/HPF: 2-3 Bacteria/HPF: many Mucous/HPF: few Epithelial/HPF: 1-3 Crystals/HPF: 0 Casts/LPF: 0 Yeast/HPF: 0 Other: 0    Comments: pt complains of burning when urinating and frequency of urine.  Pt can be reached at [phone] home or pt's daughter cell [phone]. Pt uses [employer].Please advise. Lewanda Rife LPN  August 16, 2010 10:13 AM  please call in cipro and send for cx update me if not improved     Orders Added: 1)  T-Culture, Urine [[id number]] 2)  Est. Patient Level I [84132] 3)  UA Dipstick W/ Micro (manual) [81000] Prescriptions: CIPRO 250 MG TABS (CIPROFLOXACIN HCL) 1 by mouth two times a day for 7 days  #14 x 0   Entered and Authorized by:   Judith Part MD   Signed by:   Lewanda Rife LPN on 44/12/270   Method used:   Telephoned to ...       [facility]* (retail)       [address]       [place], [place]  [address]       Ph: [phone]       Fax: [phone]   RxID:   [id number]  Patient notified as instructed by telephone.  Medication phoned to [facility] pharmacy as instructed. Lewanda Rife LPN  August 16, 2010 11:17 AM   Appended Document: uti/alc per dr. Milinda Antis urine sent for culture also.  Appended Document: uti/alc per dr. Milinda Antis

## 2011-01-18 NOTE — Progress Notes (Signed)
Summary: stopped zocor  Phone Note Call from Patient   Caller: Patient Call For: Judith Part MD Summary of Call: Pt wanted you to know that she stopped her zocor because it was causing muscle and joint pain.  She states that at her age she doesnt think she needs to be taking cholesterol medicine. Initial call taken by: [name] CMA,  March 25, 2010 8:29 AM  Follow-up for Phone Call        that is ok - I wil note on all list Follow-up by: Judith Part MD,  March 25, 2010 8:39 AM  Additional Follow-up for Phone Call Additional follow up Details #1::        Patient Advised.  Additional Follow-up by: Delilah Shan CMA Duncan Dull),  March 25, 2010 10:47 AM   New Allergies: ! ZOCOR New Allergies: ! ZOCOR  Past Medical History:    Diabetes mellitus, type II    Diverticulosis, colon    GERD    Hyperlipidemia- pt declines tx due to age    Hypertension    Osteoarthritis    Peripheral vascular disease    PMR    frequent utis        urol -- Dr Aldean Ast    card- Hochrein (consulted in hosp)

## 2011-01-18 NOTE — Miscellaneous (Signed)
  Clinical Lists Changes  Problems: Added new problem of LBBB (ICD-426.3) Added new problem of * EF  40% Observations: Added new observation of PAST MED HX: Diabetes mellitus, type II Diverticulosis, colon GERD Hyperlipidemia- pt declines tx due to age Hypertension Osteoarthritis Peripheral vascular disease PMR frequent utis Chest pain   cath..2005...normal coronaries. EF 40-50% 2005 /  EF 40%...echo...09/2009 LBBB Wide complex tachycardia   SVT in [place]  urol -- Dr Kimbrough/Humphries card- Hochrein (consulted in hosp)   (11/18/2010 19:53) Added new observation of PRIMARY MD: Judith Part MD (11/18/2010 19:53)       Past History:  Past Medical History: Diabetes mellitus, type II Diverticulosis, colon GERD Hyperlipidemia- pt declines tx due to age Hypertension Osteoarthritis Peripheral vascular disease PMR frequent utis Chest pain   cath..2005...normal coronaries. EF 40-50% 2005 /  EF 40%...echo...09/2009 LBBB Wide complex tachycardia   SVT in [place]  urol -- Dr Kimbrough/Humphries card- Hochrein (consulted in hosp)

## 2011-01-18 NOTE — Progress Notes (Signed)
Summary: pt has another UTI  Phone Note Call from Patient   Caller: Patient Summary of Call: Pt's daughter states pt has another UTI.  Is it ok if she brings in a sample since there are no appts available? Initial call taken by: [name] CMA,  June 07, 2010 8:44 AM  Follow-up for Phone Call        yes that is ok. Ruthe Mannan MD  June 07, 2010 8:52 AM  I spoke with pt, she says she is ok and doesnt need anything done right now.  She will call back if she needs to. Follow-up by: [name] CMA,  June 07, 2010 10:10 AM

## 2011-01-18 NOTE — Progress Notes (Signed)
Summary: prior Berkley Harvey is needed for protonix  Phone Note From Pharmacy   Caller: [facility]* Summary of Call: Prior auth is needed for protonix, last one has expired.  Form is on your desk. Initial call taken by: [name] CMA,  April 05, 2010 4:01 PM  Follow-up for Phone Call        I looked for prior meds she tried before protonix, not in emr.  Can you call pt to find out what she took before protonix so I can fill out prior auth? thanks Ruthe Mannan MD  April 05, 2010 4:07 PM   Additional Follow-up for Phone Call Additional follow up Details #1::        Pt has been on protonix for quite a while.  She tried tagamet and prevacid  before then.  [name] CMA  April 05, 2010 4:16 PM     Additional Follow-up for Phone Call Additional follow up Details #2::    on my desk. Follow-up by: Ruthe Mannan MD,  April 05, 2010 4:17 PM  Additional Follow-up for Phone Call Additional follow up Details #3:: Details for Additional Follow-up Action Taken: Form faxed.            [name] CMA  April 05, 2010 4:27 PM

## 2011-01-18 NOTE — Assessment & Plan Note (Signed)
Summary: ? UTI   Vital Signs:  Patient profile:   77 year old female Height:      62 inches Weight:      157 pounds BMI:     28.82 Temp:     97.5 degrees F oral Pulse rate:   64 / minute Pulse rhythm:   regular BP sitting:   150 / 80  (right arm) Cuff size:   regular  Vitals Entered By: Lewanda Rife LPN (September 03, 2010 3:19 PM) CC: ?UTI pain upon urination and pt not voiding alot.   History of Present Illness: here for another poss uti had cipro for ecoli uti at end of august   ua is pos today  used her strip last night and it did light up   is burning to urinate and had to strain to go  frequency  nausea  no vomiting  no headache  some chills - but no fever  is not having bladder pain  no new back pain   did totally feel better from last uti   symptoms of this started yesterday   in past even on proph abx did not help    Allergies: 1)  ! Metformin Hcl 2)  ! Zocor 3)  ! Septra Ds (Sulfamethoxazole-Trimethoprim) 4)  Erythromycin 5)  Tetracycline 6)  Flagyl 7)  Vioxx 8)  Nsaids 9)  * Robitussin 10)  Macrodantin 11)  Flagyl  Past History:  Past Medical History: Last updated: 03/25/2010 Diabetes mellitus, type II Diverticulosis, colon GERD Hyperlipidemia- pt declines tx due to age Hypertension Osteoarthritis Peripheral vascular disease PMR frequent utis  urol -- Dr Aldean Ast card- [facility] (consulted in hosp)   Past Surgical History: Last updated: 10/27/2009 Appendectomy(1993) Cholecystectomy Colectomy- partial (1993) Hysterectomy- fibroids (1960's)- total Vein stripping Bladder tack up Back surgery Colonoscopy (04/1999) EGD- Barretts esophagus, stomach polyps (11/2002) EGD / Colonoscopy (05/2004) Cardiac cath- minimal CAD (09/2004) CT chest- nodes (09/2004) hosp bilat pyelonephritis, chest pain, hypokalemia 10/10 2D echo septal and apical akinesis   Family History: Last updated: 04/25/2007 mother CAD, HTN, CVA b- heart  probs b-DM s-pancreatic ca  Social History: Last updated: 06/23/2008 non smoker no alcohol no regular exercise   Review of Systems General:  Complains of chills and fatigue; denies fever and loss of appetite. CV:  Denies chest pain or discomfort. Resp:  Denies cough. GI:  Denies abdominal pain, nausea, and vomiting. GU:  Complains of dysuria, incontinence, and urinary frequency; denies hematuria. MS:  Complains of mid back pain; no more than usual. Derm:  Denies rash.  Physical Exam  Abdomen:  suprapubic tenderness without rebound or gaurding   Impression & Recommendations:  Problem # 1:  UTI'S, RECURRENT (ICD-599.0) Assessment Deteriorated another uti  pt has failed prophylaxis and had numerous urol w/u and is intol to many abx tx with cipro urine cx rev red flags for pyelo  will update The following medications were removed from the medication list:    Cipro 250 Mg Tabs (Ciprofloxacin hcl) .Marland Kitchen... 1 by mouth two times a day for 7 days Her updated medication list for this problem includes:    Cipro 250 Mg Tabs (Ciprofloxacin hcl) .Marland Kitchen... 1 by mouth two times a day for 7 days  Orders: T-Culture, Urine ([address]) Specimen Handling (09811) UA Dipstick W/ Micro (manual) (81000)  Complete Medication List: 1)  Diovan 80 Mg Tabs (Valsartan) .... Take one by mouth daily 2)  Protonix 40 Mg Tbec (Pantoprazole sodium) .... Take 1 by mouth two times a  day 3)  Icaps Lutein-zeaxanthin Tbcr (Specialty vitamins products) .... Take two by mouth daily 4)  Vitamin D 1000 Unit Tabs (Cholecalciferol) .... One by mouth daily 5)  4 Prone Cane For Assistance With Gait  .... To use for oa knee 715.90 use as directed for walking 6)  Align Caps (Misc intestinal flora regulat) .... Daily as directed 7)  Aspirin 81 Mg Tbec (Aspirin) .... Take one by mouth daily 8)  Metoprolol Tartrate 25 Mg Tabs (Metoprolol tartrate) .... Take one by mouth two times a day 9)  Nitrostat 0.4 Mg Subl  (Nitroglycerin) .... Take sl as directed as needed 10)  Fish Oil 1000 Mg Caps (Omega-3 fatty acids) .... Take one by mouth daily 11)  Hydrochlorothiazide 25 Mg Tabs (Hydrochlorothiazide) .Marland Kitchen.. 1 by mouth once daily 12)  Fosamax 70 Mg Tabs (Alendronate sodium) .... Take one tablet weekly 13)  Ultram 50 Mg Tabs (Tramadol hcl) .... Take one tablet two times a day as needed 14)  Salsalate 500 Mg Tabs (Salsalate) .... One tablet by mouth two times a day. 15)  Cipro 250 Mg Tabs (Ciprofloxacin hcl) .Marland Kitchen.. 1 by mouth two times a day for 7 days  Other Orders: Flu Vaccine 2yrs + MEDICARE PATIENTS (E4540) Administration Flu vaccine - MCR (J8119)  Patient Instructions: 1)  continue drinking lots of water 2)  call or seek care is symptoms don't improve in 2-3 days or if you develop back pain, nausea, or vomiting 3)  take cipro as directed  4)  I will update you with culture result  Prescriptions: CIPRO 250 MG TABS (CIPROFLOXACIN HCL) 1 by mouth two times a day for 7 days  #14 x 0   Entered and Authorized by:   Judith Part MD   Signed by:   Judith Part MD on 09/03/2010   Method used:   Electronically to        [facility]* (retail)       [address]       [place], [place]  [address]       Ph: [phone]       Fax: [phone]   RxID:   [id number]   Current Allergies (reviewed today): ! METFORMIN HCL ! ZOCOR ! SEPTRA DS (SULFAMETHOXAZOLE-TRIMETHOPRIM) ERYTHROMYCIN TETRACYCLINE FLAGYL VIOXX NSAIDS * ROBITUSSIN MACRODANTIN FLAGYL       Laboratory Results   Urine Tests  Date/Time Received: September 03, 2010 3:21 PM  Date/Time Reported: September 03, 2010 3:21 PM   Routine Urinalysis   Color: yellow Appearance: Hazy Glucose: negative   (Normal Range: Negative) Bilirubin: negative   (Normal Range: Negative) Ketone: negative   (Normal Range: Negative) Spec. Gravity: 1.025   (Normal Range: 1.003-1.035) Blood: trace-lysed   (Normal Range: Negative) pH: 5.0    (Normal Range: 5.0-8.0) Protein: trace   (Normal Range: Negative) Urobilinogen: 0.2   (Normal Range: 0-1) Nitrite: negative   (Normal Range: Negative) Leukocyte Esterace: trace   (Normal Range: Negative)  Urine Microscopic WBC/HPF: tmtc RBC/HPF: tmtc  Bacteria/HPF: many Mucous/HPF: few Epithelial/HPF: 1-3 Crystals/HPF: 0 Casts/LPF: 0 Yeast/HPF: 0 Other: 0        Flu Vaccine Consent Questions     Do you have a history of severe allergic reactions to this vaccine? no    Any prior history of allergic reactions to egg and/or gelatin? no    Do you have a sensitivity to the preservative Thimersol? no    Do you have a past history of Guillan-Barre Syndrome? no  Do you currently have an acute febrile illness? no    Have you ever had a severe reaction to latex? no    Vaccine information given and explained to patient? yes    Are you currently pregnant? no    Lot Number:[id number]   Exp Date:06/18/2011   Site Given  Left Deltoid IMedflu  Lewanda Rife LPN  September 03, 2010 5:05 PM

## 2011-01-18 NOTE — Letter (Signed)
Summary: [facility]  [facility]   Imported By: Lanelle Bal 10/13/2010 11:27:02  _____________________________________________________________________  External Attachment:    Type:   Image     Comment:   External Document

## 2011-01-18 NOTE — Assessment & Plan Note (Signed)
Summary: F/U AFTER LABS / LFW   Vital Signs:  Patient profile:   77 year old female Height:      62 inches Weight:      156.25 pounds Temp:     97.4 degrees F oral Pulse rate:   60 / minute Pulse rhythm:   regular BP sitting:   130 / 68  (left arm) Cuff size:   regular  Vitals Entered By: Lewanda Rife LPN (February 11, 2010 10:34 AM)  History of Present Illness: here for f/u of DM an HT and lipids and renal insuff   wt is up 2 lb  bp is fairly controlled at 130/68- no problems  DM is stable with AIC 6.1 sugars are very good usually in the 90s  is eating a healthy diet  opthy-- is having cataracts off in march , and macular degeneration is being watched no DM retinop  at [facility] the 1st of the month - all ok   cr/ renal insuff ok - with cr .9  uti status is overall better - had to take one course of cipro   chol is up LDL is 147 from 89- on a statin diet  quit taking her cholesterol med -- it made her sick  felt nauseated -- ? if that was really the cause     Allergies: 1)  ! Metformin Hcl 2)  Erythromycin 3)  Tetracycline 4)  Flagyl 5)  Vioxx 6)  Nsaids 7)  * Robitussin 8)  Macrodantin 9)  Flagyl  Past History:  Past Medical History: Last updated: 10/27/2009 Diabetes mellitus, type II Diverticulosis, colon GERD Hyperlipidemia Hypertension Osteoarthritis Peripheral vascular disease PMR frequent utis  [facility]   Past Surgical History: Last updated: 10/27/2009 Appendectomy(1993) Cholecystectomy Colectomy- partial (1993) Hysterectomy- fibroids (1960's)- total Vein stripping Bladder tack up Back surgery Colonoscopy (04/1999) EGD- Barretts esophagus, stomach polyps (11/2002) EGD / Colonoscopy (05/2004) Cardiac cath- minimal CAD (09/2004) CT chest- nodes (09/2004) hosp bilat pyelonephritis, chest pain, hypokalemia 10/10 2D echo septal and apical akinesis   Family History: Last  updated: 04/25/2007 mother CAD, HTN, CVA b- heart probs b-DM s-pancreatic ca  Social History: Last updated: 06/23/2008 non smoker no alcohol no regular exercise   Review of Systems General:  Denies fatigue, fever, loss of appetite, and malaise. Eyes:  Denies blurring and eye pain. CV:  Denies chest pain or discomfort, palpitations, and shortness of breath with exertion. Resp:  Denies cough and wheezing. GI:  Denies abdominal pain, bloody stools, change in bowel habits, and indigestion. MS:  Denies joint pain and muscle aches. Derm:  Denies itching, lesion(s), poor wound healing, and rash. Neuro:  Denies numbness and tingling. Psych:  mood has been stable. Endo:  Denies cold intolerance, excessive thirst, excessive urination, and heat intolerance. Heme:  Denies abnormal bruising and bleeding.  Physical Exam  General:  Well-developed,well-nourished,in no acute distress; alert,appropriate and cooperative throughout examination Head:  normocephalic, atraumatic, and no abnormalities observed.   Eyes:  vision grossly intact, pupils equal, pupils round, and pupils reactive to light.  no conjunctival pallor, injection or icterus  Ears:  R ear normal and L ear normal.   Neck:  supple with full rom and no masses or thyromegally, no JVD or carotid bruit  Lungs:  Normal respiratory effort, chest expands symmetrically. Lungs are clear to auscultation, no crackles or wheezes. Heart:  Normal rate and regular rhythm. S1 and S2 normal without gallop, murmur, click, rub  or other extra sounds. Abdomen:  Bowel sounds positive,abdomen soft and non-tender without masses, organomegaly or hernias noted. no renal bruits  Msk:  No deformity or scoliosis noted of thoracic or lumbar spine.  no acute joint changes Pulses:  R and L carotid,radial,femoral,dorsalis pedis and posterior tibial pulses are full and equal bilaterally Extremities:  No clubbing, cyanosis, edema, or deformity noted with normal full  range of motion of all joints.   Neurologic:  sensation intact to light touch, gait normal, and DTRs symmetrical and normal.   Skin:  Intact without suspicious lesions or rashes Cervical Nodes:  No lymphadenopathy noted Psych:  normal affect, talkative and pleasant   Diabetes Management Exam:    Foot Exam (with socks and/or shoes not present):       Sensory-Pinprick/Light touch:          Left medial foot (L-4): normal          Left dorsal foot (L-5): normal          Left lateral foot (S-1): normal          Right medial foot (L-4): normal          Right dorsal foot (L-5): normal          Right lateral foot (S-1): normal       Sensory-Monofilament:          Left foot: normal          Right foot: normal       Inspection:          Left foot: normal          Right foot: normal       Nails:          Left foot: normal          Right foot: normal    Eye Exam:       Eye Exam done elsewhere          Date: 01/19/2010          Results: normal          Done by: [facility]    Impression & Recommendations:  Problem # 1:  RENAL INSUFFICIENCY (ICD-588.9) Assessment Improved better with resolution of utis and off hctz will continue to monitor  enc good fluid intake  Problem # 2:  HYPERTENSION (ICD-401.9) Assessment: Unchanged  good control with current meds lab ref f/u 6 mo   Her updated medication list for this problem includes:    Diovan 80 Mg Tabs (Valsartan) .Marland Kitchen... Take one by mouth daily    Metoprolol Tartrate 25 Mg Tabs (Metoprolol tartrate) .Marland Kitchen... Take one by mouth two times a day    Hydrochlorothiazide 25 Mg Tabs (Hydrochlorothiazide) .Marland Kitchen... 1 by mouth once daily  BP today: 130/68 Prior BP: 140/80 (11/10/2009)  Labs Reviewed: K+: 4.3 (02/08/2010) Creat: : 0.9 (02/08/2010)   Chol: 223 (02/08/2010)   HDL: 67.00 (02/08/2010)   LDL: 89 (08/13/2009)   TG: 122.0 (02/08/2010)  Problem # 3:  HYPERLIPIDEMIA (ICD-272.4) Assessment: Deteriorated up off statin- pt wants to try  again- ? if it was causing nausea will try in eve with supper - update  lab 6 wk disc goals for LDL and other numbers and reason for tx  rev low sat fat diet f/u 6 mo  Her updated medication list for this problem includes:    Zocor 20 Mg Tabs (Simvastatin) .Marland Kitchen... 1 by mouth once daily in evening with a meal  Orders: Prescription Created  Electronically [phone])  Problem # 4:  DIABETES MELLITUS, TYPE II (ICD-250.00)  Her updated medication list for this problem includes:    Diovan 80 Mg Tabs (Valsartan) .Marland Kitchen... Take one by mouth daily    Aspirin 81 Mg Tbec (Aspirin) .Marland Kitchen... Take one by mouth daily  overall sugars are well controlled and stable check AIC 3 mo and then f/u  up to date opthy foot care is good Her updated medication list for this problem includes:    Diovan 80 Mg Tabs (Valsartan) .Marland Kitchen... Take one by mouth daily    Aspirin 81 Mg Tbec (Aspirin) .Marland Kitchen... Take one by mouth daily  Labs Reviewed: Creat: 1.1 (10/27/2009)     Last Eye Exam: normal (09/18/2008) Reviewed HgBA1c results: 5.9 (08/13/2009)  6.1 (03/31/2009)  Labs Reviewed: Creat: 0.9 (02/08/2010)     Last Eye Exam: normal (01/19/2010) Reviewed HgBA1c results: 6.1 (02/08/2010)  5.9 (08/13/2009)  Complete Medication List: 1)  Diovan 80 Mg Tabs (Valsartan) .... Take one by mouth daily 2)  Protonix 40 Mg Tbec (Pantoprazole sodium) .... Take 1 by mouth two times a day 3)  Actonel 150 Mg Tabs (Risedronate sodium) .... One by mouth once monthly 4)  Icaps Lutein-zeaxanthin Tbcr (Specialty vitamins products) .... Take two by mouth daily 5)  Vitamin D 1000 Unit Tabs (Cholecalciferol) .... One by mouth daily 6)  Methenamine Hippurate 1 Gm Tabs (Methenamine hippurate) .... 1/2 by mouth two times a day as directed 7)  4 Prone Cane For Assistance With Gait  .... To use for oa knee 715.90 use as directed for walking 8)  Align Caps (Misc intestinal flora regulat) .... Daily as directed 9)  Zocor 20 Mg Tabs (Simvastatin) .Marland Kitchen.. 1 by  mouth once daily in evening with a meal 10)  Aspirin 81 Mg Tbec (Aspirin) .... Take one by mouth daily 11)  Metoprolol Tartrate 25 Mg Tabs (Metoprolol tartrate) .... Take one by mouth two times a day 12)  Nitrostat 0.4 Mg Subl (Nitroglycerin) .... Take sl as directed as needed 13)  Fish Oil 1000 Mg Caps (Omega-3 fatty acids) .... Take one by mouth daily 14)  Hydrochlorothiazide 25 Mg Tabs (Hydrochlorothiazide) .Marland Kitchen.. 1 by mouth once daily 15)  Ultram ?mg  .... Take as directed  Patient Instructions: 1)  starting zocor for cholesterol is worth one more try- please take it at night with a meal and see if that helps  2)  no other medicine changes  3)  schedule fasting labs for lipid/ast/alt 272 in 6 weeks  4)  follow up with me in 6 months  5)  keep working on healthy diet  6)  stay as active as you can  Prescriptions: ZOCOR 20 MG TABS (SIMVASTATIN) 1 by mouth once daily in evening with a meal  #30 x 11   Entered and Authorized by:   Judith Part MD   Signed by:   Judith Part MD on 02/11/2010   Method used:   Electronically to        [facility]* (retail)       [address]       [place], [place]  [address]       Ph: [phone]       Fax: [phone]   RxID:   [id number] ZOCOR 20 MG TABS (SIMVASTATIN) 1 by mouth once daily in evening with a meal  #30 x 11   Entered and Authorized by:   Judith Part MD   Signed by:   [employer]  Rose Fillers MD on 02/11/2010   Method used:   Print then Give to Patient   RxID:   [id number]   Current Allergies (reviewed today): ! METFORMIN HCL ERYTHROMYCIN TETRACYCLINE FLAGYL VIOXX NSAIDS * ROBITUSSIN MACRODANTIN FLAGYL   Pneumovax Immunization History:    Pneumovax # 1:  Pneumovax (12/20/1999)

## 2011-01-18 NOTE — Progress Notes (Signed)
Summary: refill request for hctz  Phone Note Refill Request Message from:  Fax from Pharmacy  Refills Requested: Medication #1:  HCTZ 25 mg   Last Refilled: 12/22/2009 Faxed request from [facility], this is no longer on pt's med list.  Initial call taken by: [name] CMA,  January 22, 2010 4:22 PM  Follow-up for Phone Call        px written on EMR for call in  Follow-up by: Judith Part MD,  January 22, 2010 4:57 PM    New/Updated Medications: HYDROCHLOROTHIAZIDE 25 MG TABS (HYDROCHLOROTHIAZIDE) 1 by mouth once daily Prescriptions: HYDROCHLOROTHIAZIDE 25 MG TABS (HYDROCHLOROTHIAZIDE) 1 by mouth once daily  #30 x 11   Entered by:   [name] CMA   Authorized by:   Judith Part MD   Signed by:   [name] CMA on 01/22/2010   Method used:   Electronically to        [facility]* (retail)       [address]       [place], [place]  [address]       Ph: [phone]       Fax: [phone]   RxID:   [id number] HYDROCHLOROTHIAZIDE 25 MG TABS (HYDROCHLOROTHIAZIDE) 1 by mouth once daily  #30 x 11   Entered and Authorized by:   Judith Part MD   Signed by:   Judith Part MD on 01/22/2010   Method used:   Telephoned to ...       [facility]* (retail)       [address]       [place], [place]  [address]       Ph: [phone]       Fax: [phone]   RxID:   [id number]

## 2011-01-18 NOTE — Assessment & Plan Note (Signed)
Summary: ?UTI/CLE   Vital Signs:  Patient profile:   77 year old female Height:      62 inches Weight:      157.75 pounds BMI:     28.96 Temp:     97.5 degrees F oral Pulse rate:   64 / minute Pulse rhythm:   regular BP sitting:   136 / 78  (left arm) Cuff size:   regular  Vitals Entered By: Lewanda Rife LPN (Apr 26, 2840 3:32 PM) CC: ?UTI, burning and pain when urinates and feeling of pressure   History of Present Illness: here for uti  discomfort over bladder area and also some low back pain also burning to urinate  ran a strip at home - came up pos for uti  felt a little nauseated sat night  no blood in urine but it smells bad   has fecal incontinence which causes utis  rev her urol record   ua is pos   did see Dr Wanda Plump for a while - gave sulfa to have on hand (now on med list as causing itching)  Allergies: 1)  ! Metformin Hcl 2)  ! Zocor 3)  ! Septra Ds (Sulfamethoxazole-Trimethoprim) 4)  Erythromycin 5)  Tetracycline 6)  Flagyl 7)  Vioxx 8)  Nsaids 9)  * Robitussin 10)  Macrodantin 11)  Flagyl  Past History:  Past Medical History: Last updated: 03/25/2010 Diabetes mellitus, type II Diverticulosis, colon GERD Hyperlipidemia- pt declines tx due to age Hypertension Osteoarthritis Peripheral vascular disease PMR frequent utis  urol -- Dr Aldean Ast card- Hochrein (consulted in hosp)   Past Surgical History: Last updated: 10/27/2009 Appendectomy(1993) Cholecystectomy Colectomy- partial (1993) Hysterectomy- fibroids (1960's)- total Vein stripping Bladder tack up Back surgery Colonoscopy (04/1999) EGD- Barretts esophagus, stomach polyps (11/2002) EGD / Colonoscopy (05/2004) Cardiac cath- minimal CAD (09/2004) CT chest- nodes (09/2004) hosp bilat pyelonephritis, chest pain, hypokalemia 10/10 2D echo septal and apical akinesis   Family History: Last updated: 04/25/2007 mother CAD, HTN, CVA b- heart probs b-DM s-pancreatic  ca  Social History: Last updated: 06/23/2008 non smoker no alcohol no regular exercise   Review of Systems General:  Complains of chills and fatigue; denies fever and loss of appetite. CV:  Denies chest pain or discomfort, lightheadness, and palpitations. Resp:  Denies cough, shortness of breath, and wheezing. GI:  Complains of abdominal pain; denies bloody stools and change in bowel habits. GU:  Complains of dysuria, nocturia, and urinary frequency; denies hematuria. MS:  Complains of low back pain. Derm:  Denies itching and rash.  Physical Exam  General:  Well-developed,well-nourished,in no acute distress; alert,appropriate and cooperative throughout examination Head:  normocephalic, atraumatic, and no abnormalities observed.   Eyes:  vision grossly intact, pupils equal, pupils round, and pupils reactive to light.  no conjunctival pallor, injection or icterus  Mouth:  MMM Neck:  supple with full rom and no masses or thyromegally, no JVD or carotid bruit  Lungs:  Normal respiratory effort, chest expands symmetrically. Lungs are clear to auscultation, no crackles or wheezes. Heart:  Normal rate and regular rhythm. S1 and S2 normal without gallop, murmur, click, rub or other extra sounds. Abdomen:  suprapubic tenderness without rebound or gaurding   Msk:  no CVA tenderness  Extremities:  No clubbing, cyanosis, edema, or deformity noted with normal full range of motion of all joints.   Skin:  Intact without suspicious lesions or rashes Inguinal Nodes:  No significant adenopathy Psych:  normal affect, talkative and pleasant  Impression & Recommendations:  Problem # 1:  UTI'S, RECURRENT (ICD-599.0) Assessment Deteriorated suspect 2nd to repeated/ untreatable fecal incontinence  pt does best she can with hygiene  allergic to sulfa will tx this uti with cipro and update  urine cx ordered  will likely then ( if sensitie)-- give another px with some refills to use at home rev  urol w/u with Dr Wanda Plump- [facility] us/ cysto  adv to update if worse or fever/ etc The following medications were removed from the medication list:    Methenamine Hippurate 1 Gm Tabs (Methenamine hippurate) .Marland Kitchen... 1/2 by mouth two times a day as directed Her updated medication list for this problem includes:    Cipro 250 Mg Tabs (Ciprofloxacin hcl) .Marland Kitchen... 1 by mouth two times a day for 7 days  Orders: T-Culture, Urine ([id number]) Specimen Handling (54270) UA Dipstick w/Micro (automated) (81001)  Complete Medication List: 1)  Diovan 80 Mg Tabs (Valsartan) .... Take one by mouth daily 2)  Protonix 40 Mg Tbec (Pantoprazole sodium) .... Take 1 by mouth two times a day 3)  Icaps Lutein-zeaxanthin Tbcr (Specialty vitamins products) .... Take two by mouth daily 4)  Vitamin D 1000 Unit Tabs (Cholecalciferol) .... One by mouth daily 5)  4 Prone Cane For Assistance With Gait  .... To use for oa knee 715.90 use as directed for walking 6)  Align Caps (Misc intestinal flora regulat) .... Daily as directed 7)  Aspirin 81 Mg Tbec (Aspirin) .... Take one by mouth daily 8)  Metoprolol Tartrate 25 Mg Tabs (Metoprolol tartrate) .... Take one by mouth two times a day 9)  Nitrostat 0.4 Mg Subl (Nitroglycerin) .... Take sl as directed as needed 10)  Fish Oil 1000 Mg Caps (Omega-3 fatty acids) .... Take one by mouth daily 11)  Hydrochlorothiazide 25 Mg Tabs (Hydrochlorothiazide) .Marland Kitchen.. 1 by mouth once daily 12)  Fosamax 70 Mg Tabs (Alendronate sodium) .... Take one tablet weekly 13)  Ultram 50 Mg Tabs (Tramadol hcl) .... Take one tablet two times a day as needed 14)  Cipro 250 Mg Tabs (Ciprofloxacin hcl) .Marland Kitchen.. 1 by mouth two times a day for 7 days  Patient Instructions: 1)  continue drinking lots of water 2)  call or seek care is symptoms don't improve in 2-3 days or if you develop back pain, nausea, or vomiting  3)  take cipro as directed  4)  I will update you when culture returns  5)  then will make  further plan  Prescriptions: CIPRO 250 MG TABS (CIPROFLOXACIN HCL) 1 by mouth two times a day for 7 days  #14 x 0   Entered and Authorized by:   Judith Part MD   Signed by:   Judith Part MD on 04/26/2010   Method used:   Electronically to        [facility]* (retail)       [address]       [place], [place]  [address]       Ph: [phone]       Fax: [phone]   RxID:   [id number]   Current Allergies (reviewed today): ! METFORMIN HCL ! ZOCOR ! SEPTRA DS (SULFAMETHOXAZOLE-TRIMETHOPRIM) ERYTHROMYCIN TETRACYCLINE FLAGYL VIOXX NSAIDS * ROBITUSSIN MACRODANTIN FLAGYL  Laboratory Results   Urine Tests  Date/Time Received: Apr 26, 2010 3:42 PM  Date/Time Reported: Apr 26, 2010 3:42 PM   Routine Urinalysis   Color: yellow Appearance: Hazy Glucose: negative   (Normal Range: Negative) Bilirubin:  negative   (Normal Range: Negative) Ketone: negative   (Normal Range: Negative) Spec. Gravity: 1.015   (Normal Range: 1.003-1.035) Blood: trace-lysed   (Normal Range: Negative) pH: 5.0   (Normal Range: 5.0-8.0) Protein: trace   (Normal Range: Negative) Urobilinogen: 0.2   (Normal Range: 0-1) Nitrite: positive   (Normal Range: Negative) Leukocyte Esterace: small   (Normal Range: Negative)  Urine Microscopic WBC/HPF: 2-3 RBC/HPF: 2-3 Bacteria/HPF: many Mucous/HPF: few Epithelial/HPF: 1 Crystals/HPF: few Casts/LPF: 0 Yeast/HPF: 0 Other: 0

## 2011-01-18 NOTE — Progress Notes (Signed)
Summary: uti  Phone Note Call from Patient Call back at Home Phone [phone]   Caller: Daughter- Bonita Quin Call For: Judith Part MD Summary of Call: Daugther says that her mother is having symptoms of a UTI. Daughter is asking if her mom could just bring in a urine sample and get cipro called in to [facility]. Please advise.  Initial call taken by: Melody Comas,  August 16, 2010 9:07 AM  Follow-up for Phone Call        bring in urine for sample please- then will adv further  Follow-up by: Judith Part MD,  August 16, 2010 9:14 AM  Additional Follow-up for Phone Call Additional follow up Details #1::        Spoke w/ patient and she will bring in urine sample.  Melody Comas  August 16, 2010 9:17 AM  Additional Follow-up by: Melody Comas,  August 16, 2010 9:17 AM

## 2011-01-18 NOTE — Miscellaneous (Signed)
Summary: Controlled Substance Agreement  Controlled Substance Agreement   Imported By: Lanelle Bal 07/01/2010 10:24:51  _____________________________________________________________________  External Attachment:    Type:   Image     Comment:   External Document

## 2011-01-18 NOTE — Assessment & Plan Note (Signed)
Summary: ONE WEEK FOLLOW UP / LFW   Vital Signs:  Patient profile:   77 year old female Weight:      155.50 pounds Temp:     97.8 degrees F oral Pulse rate:   68 / minute Pulse rhythm:   regular BP sitting:   144 / 70  (left arm) Cuff size:   large  Vitals Entered By: Selena Batten Dance CMA (AAMA) (September 27, 2010 2:30 PM) CC: 1 week follow up   History of Present Illness: CC: f/u UTI  Feeling tired, but abd pain better, no dysuria, frequency, urgency.  Still with accidents.  No fever/chill, back pain, no more nausea.  Seems to be having UTI every 2 wks.  had course cipro for pansensitive Ecoli 06/2010 had course cipro for pansensitive ecoli at end of august 2011 had another course of cipro for klebsiella middle of september 2011 completing course of keflex for Klebsiella UTI 09/2010  in past even on proph abx (macrobid) did not help  previously saw Dr. Aldean Ast for recurrent UTIs despite proph abx.  would not like to return to him.  Also has seen Dr. Wanda Plump with [facility] but he has changed practices.  Allergies: 1)  ! Metformin Hcl 2)  ! Zocor 3)  ! Septra Ds (Sulfamethoxazole-Trimethoprim) 4)  Erythromycin 5)  Tetracycline 6)  Flagyl 7)  Vioxx 8)  Nsaids 9)  * Robitussin 10)  Macrodantin 11)  Flagyl  Past History:  Past Medical History: Diabetes mellitus, type II Diverticulosis, colon GERD Hyperlipidemia- pt declines tx due to age Hypertension Osteoarthritis Peripheral vascular disease PMR frequent utis  urol -- Dr Kimbrough/[facility] card- Hochrein (consulted in hosp)   Review of Systems       per HPI  Physical Exam  General:  NAD, nontoxic Psych:  normal affect, talkative and pleasant    Impression & Recommendations:  Problem # 1:  UTI'S, RECURRENT (ICD-599.0) finishing keflex and feeling better.  complicated patient, recurrent UTIs even while on ppx abx.  has failed ppx macrobid.  likely re refer to uro.  discussed with patient where she would  like to go - prefers to return to [facility] for now, will see new provider there.  last note from [facility] thought UTIs due to bowel incontinence, rec bactrim at start of UTI sxs, however now bactrim on allergy list.  f/u with PCP after uro.  Her updated medication list for this problem includes:    Keflex 500 Mg Caps (Cephalexin) ..... One by mouth two times a day x 10 days  Complete Medication List: 1)  Diovan 80 Mg Tabs (Valsartan) .... Take one by mouth daily 2)  Protonix 40 Mg Tbec (Pantoprazole sodium) .... Take 1 by mouth two times a day 3)  Icaps Lutein-zeaxanthin Tbcr (Specialty vitamins products) .... Take two by mouth daily 4)  Vitamin D 1000 Unit Tabs (Cholecalciferol) .... One by mouth daily 5)  4 Prone Cane For Assistance With Gait  .... To use for oa knee 715.90 use as directed for walking 6)  Align Caps (Misc intestinal flora regulat) .... Daily as directed 7)  Aspirin 81 Mg Tbec (Aspirin) .... Take one by mouth daily 8)  Metoprolol Tartrate 25 Mg Tabs (Metoprolol tartrate) .... Take one by mouth two times a day 9)  Nitrostat 0.4 Mg Subl (Nitroglycerin) .... Take sl as directed as needed 10)  Fish Oil 1000 Mg Caps (Omega-3 fatty acids) .... Take one by mouth daily 11)  Hydrochlorothiazide 25 Mg Tabs (Hydrochlorothiazide) .Marland KitchenMarland KitchenMarland Kitchen 1  by mouth once daily 12)  Fosamax 70 Mg Tabs (Alendronate sodium) .... Take one tablet weekly 13)  Ultram 50 Mg Tabs (Tramadol hcl) .... Take one tablet two times a day as needed 14)  Salsalate 500 Mg Tabs (Salsalate) .... One tablet by mouth two times a day. 15)  Keflex 500 Mg Caps (Cephalexin) .... One by mouth two times a day x 10 days  Patient Instructions: 1)  Return after you see urologist to f/u with Dr. Milinda Antis 2)  Call [facility] for f/u appointment with urologist. 3)  We will fax records from our office to them. 4)  Good to see you today, call clinic with questions.  Current Allergies (reviewed today): ! METFORMIN HCL ! ZOCOR ! SEPTRA DS  (SULFAMETHOXAZOLE-TRIMETHOPRIM) ERYTHROMYCIN TETRACYCLINE FLAGYL VIOXX NSAIDS * ROBITUSSIN MACRODANTIN FLAGYL  Appended Document: ONE WEEK FOLLOW UP / LFW can we fax note with last few urine cultures to [facility]?  thanks.  Appended Document: ONE WEEK FOLLOW UP / LFW Notes and cultures faxed as requested

## 2011-01-18 NOTE — Letter (Signed)
Summary: Handicapped Placard/[facility]  Handicapped Placard/[facility]   Imported By: Lanelle Bal 05/14/2010 10:00:02  _____________________________________________________________________  External Attachment:    Type:   Image     Comment:   External Document

## 2011-01-18 NOTE — Assessment & Plan Note (Signed)
Summary: ?UTI/CLE   Vital Signs:  Patient profile:   77 year old female Weight:      158.50 pounds Temp:     97.8 degrees F oral Pulse rate:   64 / minute Pulse rhythm:   regular BP sitting:   150 / 80  (left arm) Cuff size:   large  Vitals Entered By: Selena Batten Dance CMA Duncan Dull) (September 20, 2010 2:25 PM) CC: ? UTI   History of Present Illness: CC: again UTI  Hurting all over, lower abdomen.  + dysuria, + urgency, frequency.  + accidents.  No fevers.  + chills.  + R flank pain > L flank.  No nausea.  Seen 2 wks ago, took cipro.  Day after completing course, checked Azo strip and found positive last Monday (7days ago).  Didn't come in because had just finished abx course and wasn't feeling too poorly.  Seems to be having UTI every 2 wks.    had course cipro for ecoli uti at end of august  had another course of cipro for klebsiella middle of september  in past even on proph abx did not help  previously saw Dr. Aldean Ast for recurrent UTIs despite proph abx.  would not like to return to him.  Allergies: 1)  ! Metformin Hcl 2)  ! Zocor 3)  ! Septra Ds (Sulfamethoxazole-Trimethoprim) 4)  Erythromycin 5)  Tetracycline 6)  Flagyl 7)  Vioxx 8)  Nsaids 9)  * Robitussin 10)  Macrodantin 11)  Flagyl  Past History:  Past Medical History: Last updated: 03/25/2010 Diabetes mellitus, type II Diverticulosis, colon GERD Hyperlipidemia- pt declines tx due to age Hypertension Osteoarthritis Peripheral vascular disease PMR frequent utis  urol -- Dr Aldean Ast card- Hochrein (consulted in hosp)   Past Surgical History: Last updated: 10/27/2009 Appendectomy(1993) Cholecystectomy Colectomy- partial (1993) Hysterectomy- fibroids (1960's)- total Vein stripping Bladder tack up Back surgery Colonoscopy (04/1999) EGD- Barretts esophagus, stomach polyps (11/2002) EGD / Colonoscopy (05/2004) Cardiac cath- minimal CAD (09/2004) CT chest- nodes (09/2004) hosp bilat pyelonephritis,  chest pain, hypokalemia 10/10 2D echo septal and apical akinesis   Social History: Last updated: 06/23/2008 non smoker no alcohol no regular exercise   Review of Systems       per HPI  Physical Exam  General:  NAD, nontoxic Abdomen:  suprapubic tenderness without rebound or gaurding.  + R CVA tenderness. no L CVA tenderness.  NABS, soft, ND. Pulses:  2+ rad Extremities:  no edema   Impression & Recommendations:  Problem # 1:  UTI'S, RECURRENT (ICD-599.0) rpt UTI.  treat as complicated given h/o abx use in past with course of cephalosporin (not on allergy list.)  RTC 1 wk for follow up, sooner for red flags.  has failed ppx macrobid.  likely re refer to uro.  Her updated medication list for this problem includes:    Keflex 500 Mg Caps (Cephalexin) ..... One by mouth two times a day x 10 days  Orders: T-Culture, Urine ([id number]) UA Dipstick W/ Micro (manual) ([address]11)  Complete Medication List: 1)  Diovan 80 Mg Tabs (Valsartan) .... Take one by mouth daily 2)  Protonix 40 Mg Tbec (Pantoprazole sodium) .... Take 1 by mouth two times a day 3)  Icaps Lutein-zeaxanthin Tbcr (Specialty vitamins products) .... Take two by mouth daily 4)  Vitamin D 1000 Unit Tabs (Cholecalciferol) .... One by mouth daily 5)  4 Prone Cane For Assistance With Gait  .... To use for oa knee 715.90 use as directed for walking  6)  Align Caps (Misc intestinal flora regulat) .... Daily as directed 7)  Aspirin 81 Mg Tbec (Aspirin) .... Take one by mouth daily 8)  Metoprolol Tartrate 25 Mg Tabs (Metoprolol tartrate) .... Take one by mouth two times a day 9)  Nitrostat 0.4 Mg Subl (Nitroglycerin) .... Take sl as directed as needed 10)  Fish Oil 1000 Mg Caps (Omega-3 fatty acids) .... Take one by mouth daily 11)  Hydrochlorothiazide 25 Mg Tabs (Hydrochlorothiazide) .Marland Kitchen.. 1 by mouth once daily 12)  Fosamax 70 Mg Tabs (Alendronate sodium) .... Take one tablet weekly 13)  Ultram 50 Mg Tabs (Tramadol hcl) ....  Take one tablet two times a day as needed 14)  Salsalate 500 Mg Tabs (Salsalate) .... One tablet by mouth two times a day. 15)  Keflex 500 Mg Caps (Cephalexin) .... One by mouth two times a day x 10 days  Patient Instructions: 1)  Return next week for follow up. 2)  Looks like another UTI.   3)  Treat with keflex twice daily x 10 days. 4)  Remember, push fluids, cranberry juice, rest. 5)  I hope you feel better. 6)  Return sooner if you start having fevers, nausea, worsening flank or abdominal pain, or other concerns. Prescriptions: KEFLEX 500 MG CAPS (CEPHALEXIN) one by mouth two times a day x 10 days  #20 x 0   Entered and Authorized by:   Eustaquio Boyden  MD   Signed by:   Eustaquio Boyden  MD on 09/20/2010   Method used:   Electronically to        [facility]* (retail)       [address]       [place], [place]  [address]       Ph: [phone]       Fax: [phone]   RxID:   [id number]   Current Allergies (reviewed today): ! METFORMIN HCL ! ZOCOR ! SEPTRA DS (SULFAMETHOXAZOLE-TRIMETHOPRIM) ERYTHROMYCIN TETRACYCLINE FLAGYL VIOXX NSAIDS * ROBITUSSIN MACRODANTIN FLAGYL  Laboratory Results   Urine Tests  Date/Time Received: September 20, 2010 2:26 PM  Date/Time Reported: September 20, 2010 2:26 PM   Routine Urinalysis   Color: yellow Appearance: Clear Glucose: negative   (Normal Range: Negative) Bilirubin: negative   (Normal Range: Negative) Ketone: negative   (Normal Range: Negative) Spec. Gravity: 1.020   (Normal Range: 1.003-1.035) Blood: moderate   (Normal Range: Negative) pH: 5.0   (Normal Range: 5.0-8.0) Protein: trace   (Normal Range: Negative) Urobilinogen: 0.2   (Normal Range: 0-1) Nitrite: positive   (Normal Range: Negative) Leukocyte Esterace: large   (Normal Range: Negative)  Urine Microscopic WBC/HPF: TNTC RBC/HPF: 5-10 Bacteria/HPF: 3+ rods Mucous/HPF: no Epithelial/HPF: rare Crystals/HPF: no Casts/LPF: no Yeast/HPF: no      Comments: read by  .....................Eustaquio Boyden  MD  September 20, 2010 3:04 PM  UCx sent.

## 2011-01-18 NOTE — Progress Notes (Signed)
Summary: regarding generic protonix  Phone Note From Pharmacy   Caller: [facility] Summary of Call: [facility] wants to change pt to generic protonix instead of brand name.  Prior auth form was completed and sent in but they cancelled that and said prior auth is not required for generic.  If ok please send to [facility]. Initial call taken by: [name] CMA,  April 07, 2010 9:56 AM    Prescriptions: PROTONIX 40 MG TBEC (PANTOPRAZOLE SODIUM) Take 1 by mouth two times a day  #180 x 3   Entered and Authorized by:   Ruthe Mannan MD   Signed by:   Ruthe Mannan MD on 04/07/2010   Method used:   Electronically to        [facility]* (retail)       [address]       [place], [place]  [address]       Ph: [phone]       Fax: [phone]   RxID:   [id number]

## 2011-01-18 NOTE — Letter (Signed)
Summary: Stacey Drain MD Rheumatology  Stacey Drain MD Rheumatology   Imported By: Lanelle Bal 05/25/2010 10:35:27  _____________________________________________________________________  External Attachment:    Type:   Image     Comment:   External Document

## 2011-01-18 NOTE — Assessment & Plan Note (Signed)
Summary: ec6/abn ekg/ chronic LBBB/chest tightness arm pain/jml  Medications Added ISOSORBIDE MONONITRATE CR 30 MG XR24H-TAB (ISOSORBIDE MONONITRATE) Take one tablet by mouth daily      Allergies Added:   Visit Type:  reestablish with cardiology Primary Provider:  Judith Part MD  CC:  chest pain.  History of Present Illness: The patient is seen for the further evaluation of chest discomfort.  I have been involved in her care in 2005.  At that time echo showed some left ventricular dysfunction.  She did have some chest pain.  Catheterization was done and her coronaries were normal.  She was in the hospital in October, 2010.  At that time he admission was not cardiac in origin.  She was seen by Dr. Antoine Poche of our group.  He felt that further cardiac evaluation was not needed.  She does have a chronic left bundle branch block.  Echocardiogram had been done at that time showing her ejection fraction was 40%.  Patient is fatigued.  She does have some shortness of breath with exercise.  She does have some chest discomfort.  The patient herself is adamant about the fact that she does not want any significant testing done.  Current Medications (verified): 1)  Diovan 80 Mg Tabs (Valsartan) .... Take One By Mouth Daily 2)  Protonix 40 Mg Tbec (Pantoprazole Sodium) .... Take 1 By Mouth Two Times A Day 3)  Icaps Lutein-Zeaxanthin   Tbcr (Specialty Vitamins Products) .... Take Two By Mouth Daily 4)  Vitamin D 1000 Unit Tabs (Cholecalciferol) .... One By Mouth Daily 5)  Aspirin 81 Mg Tbec (Aspirin) .... Take One By Mouth Daily 6)  Metoprolol Tartrate 25 Mg Tabs (Metoprolol Tartrate) .... Take One By Mouth Two Times A Day 7)  Nitrostat 0.4 Mg Subl (Nitroglycerin) .... Take Sl As Directed As Needed 8)  Fish Oil 1000 Mg Caps (Omega-3 Fatty Acids) .... Take One By Mouth Daily 9)  Hydrochlorothiazide 25 Mg Tabs (Hydrochlorothiazide) .Marland Kitchen.. 1 By Mouth Once Daily 10)  Ultram 50 Mg Tabs (Tramadol Hcl) ....  Take One Tablet Two Times A Day As Needed 11)  Salsalate 500 Mg Tabs (Salsalate) .... One Tablet By Mouth Two Times A Day. 12)  Keflex 250 Mg Caps (Cephalexin) .... Take 1 Capsule By Mouth Once A Day 13)  Enablex 7.5 Mg Xr24h-Tab (Darifenacin Hydrobromide) .... Take 1 Tablet By Mouth Once A Day  Allergies (verified): 1)  ! Metformin Hcl 2)  ! Zocor 3)  ! Septra Ds (Sulfamethoxazole-Trimethoprim) 4)  Erythromycin 5)  Tetracycline 6)  Flagyl 7)  Vioxx 8)  Nsaids 9)  * Robitussin 10)  Macrodantin 11)  Flagyl  Past History:  Past Medical History: Diabetes mellitus, type II Diverticulosis, colon GERD Hyperlipidemia- pt declines tx due to age Hypertension Osteoarthritis Peripheral vascular disease PMR frequent utis Chest pain   cath..2005...normal coronaries. EF 40-50% 2005 /  EF 40%...echo...09/2009.Marland Kitchen LBBB Wide complex tachycardia   SVT in [facility]  urol -- Dr Kimbrough/Humphries card- Hochrein (consulted in hosp)   Review of Systems       Patient denies fever, chills, headache, sweats, rash, change in vision, change in hearing, cough, nausea vomiting, urinary symptoms.  All of the systems are reviewed and are negative.  Vital Signs:  Patient profile:   77 year old female Height:      62 inches Weight:      156 pounds BMI:     28.64 Pulse rate:   70 / minute BP sitting:  142 / 74  (left arm) Cuff size:   regular  Vitals Entered By: Hardin Negus, RMA (November 19, 2010 11:16 AM)  Physical Exam  General:  patient is stable today. Head:  head is atraumatic. Eyes:  no xanthelasma. Neck:  no jugular venous distention. Chest Wall:  no chest wall tenderness. Lungs:  lungs are clear.  Respiratory effort is nonlabored Heart:  cardiac exam reveals S1-S2.  Soft systolic murmur. Abdomen:  abdomen is soft. Msk:  there is kyphosis the spine. Extremities:  no peripheral edema. Skin:  no skin rashes. Psych:  patient is oriented to person time and place.  She is here with  2 daughters.  She wanted to finish the examination as rapidly as possible and go home.   Impression & Recommendations:  Problem # 1:  * EF  40% We know that her ejection fraction was 40% in October, 2010.  She does not have any signs of congestive heart failure.  No further testing is needed.  Problem # 2:  LBBB (ICD-426.3)  Her updated medication list for this problem includes:    Aspirin 81 Mg Tbec (Aspirin) .Marland Kitchen... Take one by mouth daily    Metoprolol Tartrate 25 Mg Tabs (Metoprolol tartrate) .Marland Kitchen... Take one by mouth two times a day    Nitrostat 0.4 Mg Subl (Nitroglycerin) .Marland Kitchen... Take sl as directed as needed    Salsalate 500 Mg Tabs (Salsalate) ..... One tablet by mouth two times a day.    Isosorbide Mononitrate Cr 30 Mg Xr24h-tab (Isosorbide mononitrate) .Marland Kitchen... Take one tablet by mouth daily Patient has a left bundle branch block.  No further workup needed.  Problem # 3:  UTI'S, RECURRENT (ICD-599.0)  Her updated medication list for this problem includes:    Keflex 250 Mg Caps (Cephalexin) .Marland Kitchen... Take 1 capsule by mouth once a day    Enablex 7.5 Mg Xr24h-tab (Darifenacin hydrobromide) .Marland Kitchen... Take 1 tablet by mouth once a day She has recurrent urinary tract infections.  She is on chronic Keflex therapy.  Problem # 4:  FATIGUE (ICD-780.79) The patient has overall significant fatigue.  At this time I am not convinced that this is related to left ventricular dysfunction.  I am not convinced that this is an anginal equivalent.  Problem # 5:  HYPERTENSION (ICD-401.9)  Her updated medication list for this problem includes:    Diovan 80 Mg Tabs (Valsartan) .Marland Kitchen... Take one by mouth daily    Aspirin 81 Mg Tbec (Aspirin) .Marland Kitchen... Take one by mouth daily    Metoprolol Tartrate 25 Mg Tabs (Metoprolol tartrate) .Marland Kitchen... Take one by mouth two times a day    Hydrochlorothiazide 25 Mg Tabs (Hydrochlorothiazide) .Marland Kitchen... 1 by mouth once daily    Salsalate 500 Mg Tabs (Salsalate) ..... One tablet by mouth two  times a day. Blood pressure is stable today.  No change in therapy.  Problem # 6:  CHEST PAIN (ICD-786.50)  Her updated medication list for this problem includes:    Aspirin 81 Mg Tbec (Aspirin) .Marland Kitchen... Take one by mouth daily    Metoprolol Tartrate 25 Mg Tabs (Metoprolol tartrate) .Marland Kitchen... Take one by mouth two times a day    Nitrostat 0.4 Mg Subl (Nitroglycerin) .Marland Kitchen... Take sl as directed as needed    Salsalate 500 Mg Tabs (Salsalate) ..... One tablet by mouth two times a day.    Isosorbide Mononitrate Cr 30 Mg Xr24h-tab (Isosorbide mononitrate) .Marland Kitchen... Take one tablet by mouth daily The assessment of the patient's chest pain  is difficult.  We know that she has normal coronaries as of 2005.  There is historically some left ventricular dysfunction.  With left bundle branch block we cannot assess whether there have been any EKG changes.  I cannot rule out the possibility that some of her symptoms or from angina.  However I have no definite proof of this.  With no signs of CHF, I would not change her medicines significantly.  With her systolic blood pressure stable we could consider the addition of a nitrate to see if this helps with her symptoms.  Other than this I would not recommend any further workup.  The patient is clear that she does not want significant testing.  If she were to develop unrelenting angina I would recommend more aggressive evaluation.  I believe that that is not the case at this time.  I will rediscuss with the patient and her daughters the possibility of a trial of low-dose Imdur. After careful discussion we have decided to try 30 mg of Imdur.  This will be stopped if she has any significant headaches.  Followup will be with Dr.Tower.  Patient Instructions: 1)  Start Imdur 30mg  daily Prescriptions: ISOSORBIDE MONONITRATE CR 30 MG XR24H-TAB (ISOSORBIDE MONONITRATE) Take one tablet by mouth daily  #30 x 6   Entered by:   Meredith Staggers, RN   Authorized by:   Talitha Givens, MD,  [facility]   Signed by:   Meredith Staggers, RN on 11/19/2010   Method used:   Electronically to        [facility]* (retail)       [address]       [place], [place]  [address]       Ph: [phone]       Fax: [phone]   RxID:   [id number]

## 2011-01-20 NOTE — Progress Notes (Signed)
Summary: form for diabetic supplies  Phone Note From Pharmacy   Caller: [facility]  Summary of Call: Form for diabetic supplies is on your shelf.  I checked with the pt and she does want these supplies. Initial call taken by: [name] CMA, AAMA,  December 01, 2010 9:50 AM  Follow-up for Phone Call        form done and in nurse in box  Follow-up by: Judith Part MD,  December 01, 2010 1:40 PM  Additional Follow-up for Phone Call Additional follow up Details #1::        completed form faxed to [phone] as instructed. Form sent for scanning.Lewanda Rife LPN  December 01, 2010 2:37 PM

## 2011-01-20 NOTE — Letter (Signed)
Summary: [facility]  [facility]   Imported By: Maryln Gottron 01/07/2011 15:06:57  _____________________________________________________________________  External Attachment:    Type:   Image     Comment:   External Document

## 2011-01-20 NOTE — Medication Information (Signed)
Summary: [facility] Supplies/[facility]  [facility]   Imported By: Lanelle Bal 12/08/2010 08:33:52  _____________________________________________________________________  External Attachment:    Type:   Image     Comment:   External Document

## 2011-01-20 NOTE — Letter (Signed)
Summary: [facility]  [facility]   Imported By: Lanelle Bal 12/29/2010 12:01:11  _____________________________________________________________________  External Attachment:    Type:   Image     Comment:   External Document

## 2011-01-20 NOTE — Progress Notes (Signed)
Summary: form for diabetic shoes  Phone Note From Pharmacy   Caller: [facility] Summary of Call: Form for diabetic shoes is on your shelf. Initial call taken by: [name] CMA, AAMA,  December 09, 2010 2:44 PM  Follow-up for Phone Call        form done and in nurse in box  Follow-up by: Judith Part MD,  December 10, 2010 8:00 AM  Additional Follow-up for Phone Call Additional follow up Details #1::        completed form faxed to [phone]. sent to be scanned.Lewanda Rife LPN  December 10, 2010 9:39 AM

## 2011-01-20 NOTE — Medication Information (Signed)
Summary: [facility]  [facility]   Imported By: Lester [place] 12/21/2010 10:44:45  _____________________________________________________________________  External Attachment:    Type:   Image     Comment:   External Document

## 2011-02-07 ENCOUNTER — Ambulatory Visit: Payer: Self-pay | Admitting: Family Medicine

## 2011-02-11 ENCOUNTER — Other Ambulatory Visit: Payer: Self-pay | Admitting: Family Medicine

## 2011-02-11 ENCOUNTER — Ambulatory Visit (INDEPENDENT_AMBULATORY_CARE_PROVIDER_SITE_OTHER): Payer: Medicare Other | Admitting: Family Medicine

## 2011-02-11 ENCOUNTER — Encounter: Payer: Self-pay | Admitting: Family Medicine

## 2011-02-11 DIAGNOSIS — M353 Polymyalgia rheumatica: Secondary | ICD-10-CM

## 2011-02-11 DIAGNOSIS — M1990 Unspecified osteoarthritis, unspecified site: Secondary | ICD-10-CM

## 2011-02-11 LAB — SEDIMENTATION RATE: Sed Rate: 44 mm/hr — ABNORMAL HIGH (ref 0–22)

## 2011-02-24 NOTE — Assessment & Plan Note (Signed)
Summary: 30 MIN DISCUSS SEVERAL ISSUES/CLE   Vital Signs:  Patient profile:   77 year old female Height:      62 inches Weight:      158.75 pounds BMI:     29.14 Temp:     97.7 degrees F oral Pulse rate:   68 / minute Pulse rhythm:   regular BP sitting:   130 / 64  (left arm) Cuff size:   regular  Vitals Entered By: Lewanda Rife LPN (February 11, 2011 12:26 PM) CC: discuss can pt take Protonix more often than twice a day. Also pt wants to stop going to dr Kellie Simmering if Dr Milinda Antis can start to give pain med and prednisone injections in joint.s   History of Present Illness: Dr Kellie Simmering sees her for arthritis - osteoarthritis  he occ does a joint injection  (joint injections don't help) does have tendonitis  chronically elevated ESR - but not on prednisone --  PMR   takes tramadol 2-3 times per day gets pain in epigastric area  often takes it with her protonix  ? if better with food- has not really tried it   she just wants to take med and no further work up     has sugar log with her 90-110 in ams usually pp is one teens to 140s usually with a few higher or lower - overall pretty good     saw cardiol for chest pain  decided against aggressive w/u put on imdur -- feels ok chest pain has calmed down  Allergies: 1)  ! Metformin Hcl 2)  ! Zocor 3)  ! Septra Ds (Sulfamethoxazole-Trimethoprim) 4)  Erythromycin 5)  Tetracycline 6)  Flagyl 7)  Vioxx 8)  Nsaids 9)  * Robitussin 10)  Macrodantin 11)  Flagyl  Past History:  Past Medical History: Last updated: 11/19/2010 Diabetes mellitus, type II Diverticulosis, colon GERD Hyperlipidemia- pt declines tx due to age Hypertension Osteoarthritis Peripheral vascular disease PMR frequent utis Chest pain   cath..2005...normal coronaries. EF 40-50% 2005 /  EF 40%...echo...09/2009.Marland Kitchen LBBB Wide complex tachycardia   SVT in [facility]  urol -- Dr Kimbrough/Humphries card- Hochrein (consulted in hosp)   Past Surgical  History: Last updated: 10/27/2009 Appendectomy(1993) Cholecystectomy Colectomy- partial (1993) Hysterectomy- fibroids (1960's)- total Vein stripping Bladder tack up Back surgery Colonoscopy (04/1999) EGD- Barretts esophagus, stomach polyps (11/2002) EGD / Colonoscopy (05/2004) Cardiac cath- minimal CAD (09/2004) CT chest- nodes (09/2004) hosp bilat pyelonephritis, chest pain, hypokalemia 10/10 2D echo septal and apical akinesis   Family History: Last updated: 04/25/2007 mother CAD, HTN, CVA b- heart probs b-DM s-pancreatic ca  Social History: Last updated: 06/23/2008 non smoker no alcohol no regular exercise   Review of Systems General:  Complains of fatigue; denies chills, fever, loss of appetite, and malaise. Eyes:  Denies blurring and eye irritation. CV:  Denies chest pain or discomfort, lightheadness, and palpitations. Resp:  Denies cough, shortness of breath, and wheezing. GI:  Denies abdominal pain, change in bowel habits, indigestion, and nausea. GU:  Denies dysuria and urinary frequency. MS:  Complains of joint pain, low back pain, muscle aches, and stiffness; denies joint redness, joint swelling, and muscle weakness. Derm:  Denies itching, lesion(s), poor wound healing, and rash. Neuro:  Denies numbness and tingling. Psych:  mood is fair . Endo:  Denies cold intolerance, excessive thirst, excessive urination, and heat intolerance. Heme:  Denies abnormal bruising and bleeding.  Physical Exam  General:  elderly female - well but generally fatigued appearing Head:  normocephalic, atraumatic, and no abnormalities observed.   Eyes:  vision grossly intact, pupils equal, pupils round, and pupils reactive to light.   Mouth:  pharynx pink and moist.   Neck:  supple with full rom and no masses or thyromegally, no JVD or carotid bruit  Lungs:  Normal respiratory effort, chest expands symmetrically. Lungs are clear to auscultation, no crackles or wheezes. Heart:   Normal rate and regular rhythm. S1 and S2 normal without gallop, murmur, click, rub or other extra sounds. Msk:  No deformity or scoliosis noted of thoracic or lumbar spine.  no new joint changes  tender trigger points in shoulder girdle area  difficulty abducting shoulders past 90 deg due to pain Extremities:  no edema Neurologic:  sensation intact to light touch, gait normal, and DTRs symmetrical and normal.   Skin:  Intact without suspicious lesions or rashes Cervical Nodes:  No lymphadenopathy noted Psych:  pt seems mildly down -- but talkative   Impression & Recommendations:  Problem # 1:  OSTEOARTHRITIS (ICD-715.90) Assessment Unchanged pt wants to stop seeing rheumatology for convenience and have the tramadol come from me take 2-3 per day refilled today disc imp of not having this px from different doctors disc safety with potential for dizziness and falls The following medications were removed from the medication list:    Salsalate 500 Mg Tabs (Salsalate) ..... One tablet by mouth two times a day. Her updated medication list for this problem includes:    Aspirin 81 Mg Tbec (Aspirin) .Marland Kitchen... Take one by mouth daily    Ultram 50 Mg Tabs (Tramadol hcl) .Marland Kitchen... Take one tablet up to three times a day as needed pain  Problem # 2:  Hx of POLYMYALGIA RHEUMATICA (ICD-725) Assessment: Unchanged will continue to monitor chronically high sed rate get last rheum note and compare with previous chronic shoulder girdle pain unchanged  The following medications were removed from the medication list:    Salsalate 500 Mg Tabs (Salsalate) ..... One tablet by mouth two times a day. Her updated medication list for this problem includes:    Aspirin 81 Mg Tbec (Aspirin) .Marland Kitchen... Take one by mouth daily  Orders: Venipuncture ([address]) TLB-Sedimentation Rate (ESR) (85652-ESR)  Complete Medication List: 1)  Diovan 80 Mg Tabs (Valsartan) .... Take one by mouth daily 2)  Protonix 40 Mg Tbec  (Pantoprazole sodium) .... Take 1 by mouth two times a day 3)  Icaps Lutein-zeaxanthin Tbcr (Specialty vitamins products) .... Take two by mouth daily 4)  Vitamin D 1000 Unit Tabs (Cholecalciferol) .... One by mouth daily 5)  Aspirin 81 Mg Tbec (Aspirin) .... Take one by mouth daily 6)  Metoprolol Tartrate 25 Mg Tabs (Metoprolol tartrate) .... Take one by mouth two times a day 7)  Nitrostat 0.4 Mg Subl (Nitroglycerin) .... Take sl as directed as needed 8)  Fish Oil 1000 Mg Caps (Omega-3 fatty acids) .... Take one by mouth daily 9)  Hydrochlorothiazide 25 Mg Tabs (Hydrochlorothiazide) .Marland Kitchen.. 1 by mouth once daily 10)  Ultram 50 Mg Tabs (Tramadol hcl) .... Take one tablet up to three times a day as needed pain 11)  Keflex 250 Mg Caps (Cephalexin) .... Take 1 capsule by mouth once a day 12)  Enablex 7.5 Mg Xr24h-tab (Darifenacin hydrobromide) .... Take 1 tablet by mouth once a day 13)  Isosorbide Mononitrate Cr 30 Mg Xr24h-tab (Isosorbide mononitrate) .... Take one tablet by mouth daily 14)  Trimethoprim 100 Mg Tabs (Trimethoprim) .... Take 1 tablet by mouth once a  day  Patient Instructions: 1)  please send for last note and labs from Dr Kellie Simmering 2)  I will take over px your tramadol -- use caution as this is a strong always take tramadol with food  3)  pain medicine and can cause dizziness and falls 4)  check sed rate today  5)  stop the salsalate if you are on it  Prescriptions: ULTRAM 50 MG TABS (TRAMADOL HCL) take one tablet up to three times a day as needed pain  #90 x 3   Entered and Authorized by:   Judith Part MD   Signed by:   Judith Part MD on 02/11/2011   Method used:   Print then Give to Patient   RxID:   [id number]    Orders Added: 1)  Venipuncture [413[address]] 2)  TLB-Sedimentation Rate (ESR) [85652-ESR] 3)  Est. Patient Level IV [40102]    Current Allergies (reviewed today): ! METFORMIN HCL ! ZOCOR ! SEPTRA DS  (SULFAMETHOXAZOLE-TRIMETHOPRIM) ERYTHROMYCIN TETRACYCLINE FLAGYL VIOXX NSAIDS * ROBITUSSIN MACRODANTIN FLAGYL

## 2011-03-02 LAB — URINE MICROSCOPIC-ADD ON

## 2011-03-02 LAB — URINALYSIS, ROUTINE W REFLEX MICROSCOPIC
Bilirubin Urine: NEGATIVE
Ketones, ur: NEGATIVE mg/dL
Nitrite: NEGATIVE
Protein, ur: 30 mg/dL — AB
Urobilinogen, UA: 0.2 mg/dL (ref 0.0–1.0)

## 2011-03-02 LAB — URINE CULTURE: Colony Count: >=100000

## 2011-03-24 LAB — BASIC METABOLIC PANEL
BUN: 12 mg/dL (ref 6–23)
BUN: 22 mg/dL (ref 6–23)
BUN: 9 mg/dL (ref 6–23)
CO2: 20 mEq/L (ref 19–32)
Calcium: 7 mg/dL — ABNORMAL LOW (ref 8.4–10.5)
Calcium: 7.6 mg/dL — ABNORMAL LOW (ref 8.4–10.5)
Chloride: 109 mEq/L (ref 96–112)
Chloride: 109 mEq/L (ref 96–112)
Creatinine, Ser: 0.98 mg/dL (ref 0.4–1.2)
GFR calc Af Amer: >60 mL/min (ref >60)
GFR calc Af Amer: >60 mL/min (ref >60)
GFR calc non Af Amer: 40 mL/min — ABNORMAL LOW (ref >60)
GFR calc non Af Amer: 54 mL/min — ABNORMAL LOW (ref >60)
GFR calc non Af Amer: 59 mL/min — ABNORMAL LOW (ref >60)
GFR calc non Af Amer: >60 mL/min (ref >60)
GFR calc non Af Amer: >60 mL/min (ref >60)
Glucose, Bld: 137 mg/dL — ABNORMAL HIGH (ref 70–99)
Glucose, Bld: 153 mg/dL — ABNORMAL HIGH (ref 70–99)
Glucose, Bld: 95 mg/dL (ref 70–99)
Potassium: 3.5 mEq/L (ref 3.5–5.1)
Potassium: 3.7 mEq/L (ref 3.5–5.1)
Potassium: 3.8 mEq/L (ref 3.5–5.1)
Potassium: 3.9 mEq/L (ref 3.5–5.1)
Sodium: 135 mEq/L (ref 135–145)

## 2011-03-24 LAB — CBC
HCT: 27.3 % — ABNORMAL LOW (ref 36.0–46.0)
HCT: 29.9 % — ABNORMAL LOW (ref 36.0–46.0)
HCT: 30.5 % — ABNORMAL LOW (ref 36.0–46.0)
HCT: 35.8 % — ABNORMAL LOW (ref 36.0–46.0)
Hemoglobin: 10 g/dL — ABNORMAL LOW (ref 12.0–15.0)
Hemoglobin: 10.2 g/dL — ABNORMAL LOW (ref 12.0–15.0)
Hemoglobin: 9.3 g/dL — ABNORMAL LOW (ref 12.0–15.0)
MCHC: 34 g/dL (ref 30.0–36.0)
MCHC: 34.2 g/dL (ref 30.0–36.0)
MCHC: 34.3 g/dL (ref 30.0–36.0)
MCV: 93.8 fL (ref 78.0–100.0)
MCV: 94.1 fL (ref 78.0–100.0)
Platelets: 250 10*3/uL (ref 150–400)
Platelets: 271 10*3/uL (ref 150–400)
RBC: 2.99 MIL/uL — ABNORMAL LOW (ref 3.87–5.11)
RBC: 3.12 MIL/uL — ABNORMAL LOW (ref 3.87–5.11)
RBC: 3.25 MIL/uL — ABNORMAL LOW (ref 3.87–5.11)
RDW: 13.6 % (ref 11.5–15.5)
RDW: 13.9 % (ref 11.5–15.5)
RDW: 14.1 % (ref 11.5–15.5)
RDW: 14.1 % (ref 11.5–15.5)
WBC: 10.7 10*3/uL — ABNORMAL HIGH (ref 4.0–10.5)
WBC: 11.3 10*3/uL — ABNORMAL HIGH (ref 4.0–10.5)
WBC: 11.5 10*3/uL — ABNORMAL HIGH (ref 4.0–10.5)
WBC: 25 10*3/uL — ABNORMAL HIGH (ref 4.0–10.5)

## 2011-03-24 LAB — CARDIAC PANEL(CRET KIN+CKTOT+MB+TROPI)
CK, MB: 1.4 ng/mL (ref 0.3–4.0)
CK, MB: 1.6 ng/mL (ref 0.3–4.0)
CK, MB: 1.8 ng/mL (ref 0.3–4.0)
CK, MB: 2 ng/mL (ref 0.3–4.0)
Relative Index: INVALID (ref 0.0–2.5)
Relative Index: INVALID (ref 0.0–2.5)
Relative Index: INVALID (ref 0.0–2.5)
Relative Index: INVALID (ref 0.0–2.5)
Total CK: 31 U/L (ref 7–177)
Total CK: 45 U/L (ref 7–177)
Total CK: 45 U/L (ref 7–177)
Troponin I: 0.13 ng/mL — ABNORMAL HIGH (ref 0.00–0.06)
Troponin I: 0.41 ng/mL — ABNORMAL HIGH (ref 0.00–0.06)

## 2011-03-24 LAB — GLUCOSE, CAPILLARY
Glucose-Capillary: 109 mg/dL — ABNORMAL HIGH (ref 70–99)
Glucose-Capillary: 114 mg/dL — ABNORMAL HIGH (ref 70–99)
Glucose-Capillary: 115 mg/dL — ABNORMAL HIGH (ref 70–99)
Glucose-Capillary: 118 mg/dL — ABNORMAL HIGH (ref 70–99)
Glucose-Capillary: 124 mg/dL — ABNORMAL HIGH (ref 70–99)
Glucose-Capillary: 129 mg/dL — ABNORMAL HIGH (ref 70–99)
Glucose-Capillary: 134 mg/dL — ABNORMAL HIGH (ref 70–99)
Glucose-Capillary: 79 mg/dL (ref 70–99)
Glucose-Capillary: 90 mg/dL (ref 70–99)
Glucose-Capillary: 94 mg/dL (ref 70–99)
Glucose-Capillary: 99 mg/dL (ref 70–99)

## 2011-03-24 LAB — COMPREHENSIVE METABOLIC PANEL
ALT: 20 U/L (ref 0–35)
AST: 24 U/L (ref 0–37)
Albumin: 3.3 g/dL — ABNORMAL LOW (ref 3.5–5.2)
Alkaline Phosphatase: 65 U/L (ref 39–117)
BUN: 20 mg/dL (ref 6–23)
CO2: 23 mEq/L (ref 19–32)
Calcium: 7.3 mg/dL — ABNORMAL LOW (ref 8.4–10.5)
Calcium: 8.6 mg/dL (ref 8.4–10.5)
Chloride: 102 mEq/L (ref 96–112)
Creatinine, Ser: 0.81 mg/dL (ref 0.4–1.2)
GFR calc Af Amer: 37 mL/min — ABNORMAL LOW (ref >60)
GFR calc non Af Amer: 30 mL/min — ABNORMAL LOW (ref >60)
Glucose, Bld: 120 mg/dL — ABNORMAL HIGH (ref 70–99)
Potassium: 3.3 mEq/L — ABNORMAL LOW (ref 3.5–5.1)
Sodium: 134 mEq/L — ABNORMAL LOW (ref 135–145)
Total Bilirubin: 0.8 mg/dL (ref 0.3–1.2)
Total Protein: 7.2 g/dL (ref 6.0–8.3)

## 2011-03-24 LAB — LIPASE, BLOOD
Lipase: 20 U/L (ref 11–59)
Lipase: 28 U/L (ref 11–59)

## 2011-03-24 LAB — DIFFERENTIAL
Basophils Absolute: 0 10*3/uL (ref 0.0–0.1)
Basophils Relative: 1 % (ref 0–1)
Eosinophils Relative: 0 % (ref 0–5)
Eosinophils Relative: 1 % (ref 0–5)
Eosinophils Relative: 2 % (ref 0–5)
Lymphocytes Relative: 10 % — ABNORMAL LOW (ref 12–46)
Lymphocytes Relative: 13 % (ref 12–46)
Lymphocytes Relative: 3 % — ABNORMAL LOW (ref 12–46)
Lymphs Abs: 1.2 10*3/uL (ref 0.7–4.0)
Lymphs Abs: 1.5 10*3/uL (ref 0.7–4.0)
Monocytes Absolute: 1.1 10*3/uL — ABNORMAL HIGH (ref 0.1–1.0)
Monocytes Relative: 10 % (ref 3–12)
Monocytes Relative: 4 % (ref 3–12)
Neutro Abs: 19.6 10*3/uL — ABNORMAL HIGH (ref 1.7–7.7)
Neutro Abs: 8.3 10*3/uL — ABNORMAL HIGH (ref 1.7–7.7)
Neutrophils Relative %: 73 % (ref 43–77)
Neutrophils Relative %: 92 % — ABNORMAL HIGH (ref 43–77)

## 2011-03-24 LAB — URINE MICROSCOPIC-ADD ON

## 2011-03-24 LAB — URINALYSIS, ROUTINE W REFLEX MICROSCOPIC
Ketones, ur: 15 mg/dL — AB
Nitrite: POSITIVE — AB
Specific Gravity, Urine: 1.014 (ref 1.005–1.030)
Urobilinogen, UA: 1 mg/dL (ref 0.0–1.0)
pH: 5.5 (ref 5.0–8.0)

## 2011-03-24 LAB — LIPID PANEL
Cholesterol: 147 mg/dL (ref 0–200)
HDL: 40 mg/dL (ref >39)
LDL Cholesterol: 79 mg/dL (ref 0–99)
Triglycerides: 139 mg/dL (ref <150)

## 2011-03-24 LAB — MAGNESIUM
Magnesium: 2 mg/dL (ref 1.5–2.5)
Magnesium: 2.4 mg/dL (ref 1.5–2.5)

## 2011-03-24 LAB — PHOSPHORUS
Phosphorus: 1.2 mg/dL — ABNORMAL LOW (ref 2.3–4.6)
Phosphorus: 2.2 mg/dL — ABNORMAL LOW (ref 2.3–4.6)

## 2011-03-24 LAB — CK TOTAL AND CKMB (NOT AT ARMC)
Relative Index: INVALID (ref 0.0–2.5)
Total CK: 48 U/L (ref 7–177)

## 2011-03-24 LAB — URINE CULTURE: Colony Count: >=100000

## 2011-03-24 LAB — CULTURE, BLOOD (ROUTINE X 2): Culture: NO GROWTH

## 2011-03-24 LAB — FOLATE: Folate: 12 ng/mL

## 2011-03-24 LAB — CALCIUM: Calcium: 7.8 mg/dL — ABNORMAL LOW (ref 8.4–10.5)

## 2011-04-12 ENCOUNTER — Other Ambulatory Visit: Payer: Self-pay | Admitting: *Deleted

## 2011-04-12 MED ORDER — PANTOPRAZOLE SODIUM 40 MG PO TBEC: 40.0000 mg | DELAYED_RELEASE_TABLET | Freq: Two times a day (BID) | ORAL | Status: DC

## 2011-04-13 ENCOUNTER — Other Ambulatory Visit: Payer: Self-pay | Admitting: *Deleted

## 2011-04-13 MED ORDER — VALSARTAN 80 MG PO TABS: 80.0000 mg | ORAL_TABLET | Freq: Every day | ORAL | Status: DC

## 2011-05-06 NOTE — Op Note (Signed)
Ellen Peterson, Ellen Peterson                            ACCOUNT NO.:  [id number]   MEDICAL RECORD NO.:  [id number]                   PATIENT TYPE:  AMB   LOCATION:  ENDO                                 FACILITY:  [facility]   PHYSICIAN:  Anselmo Rod, M.D.               DATE OF BIRTH:  06/28/1920   DATE OF PROCEDURE:  12/04/2002  DATE OF DISCHARGE:                                 OPERATIVE REPORT   PROCEDURE PERFORMED:  Esophagogastroduodenoscopy with biopsies.   ENDOSCOPIST:  Anselmo Rod, M.D.   INSTRUMENT USED:  Olympus video pan endoscope.   INDICATIONS FOR PROCEDURE:  The patient is an 77 year old with a history of  Barrett's mucosa and epigastric pain, rule out peptic ulcer disease. The  patient's has also had one surgery done in the remote past for ulcers. She  has had some epigastric pain and started the use of proton pump inhibitors.  A repeat EGD is being done for surveillance with regards to Barrett's mucosa  as well.   PREPROCEDURE PREPARATION:  Informed consent was obtained from the patient.  The patient was  in a fasting state for eight hours prior to  the procedure.   PHYSICAL EXAMINATION:  The preprocedure physical, the patient had stable  vital signs, neck was supple, chest clear to auscultation, S1, S2.  the  lungs were clear and the abdomen was soft with epigastric tenderness on  palpation, no rebound or guarding or rigidity, no hepatosplenomegaly.   DESCRIPTION OF PROCEDURE:  The patient was placed in the left lateral  decubitus position and sedated with 30 mg of Demerol and 3 mg of Versed  intravenously. Once the patient was adequately sedated and  maintained on  low  flow oxygen and continuous cardiac monitoring, the Olympus video  panendoscope was advanced through the mouthpiece, over the tongue, into the  esophagus, and on direct visualization, there was no evidence of Barrett's  mucosa at the Z-line. The entire esophagus appeared healthy  and was widely  patent.   The scope was then advanced into the stomach and three small  polyps were  seen in the proximal half of the stomach. These were biopsied for pathology.  There was evidence of Billroth 1 surgery. The proximal small bowel appeared  normal and benign. The patient exhibited no evidence of outlet obstruction.  No ulcers, erosions, masses or polyps were seen in the stomach.   IMPRESSION:  1. No evidence of Barrett's mucosa.  2. Three small polyps in the stomach, biopsied for pathology.  3. Evidence of distal antrectomy consistent with Billroth 1 surgery.  4. Normal proximal small bowel.   RECOMMENDATIONS:  1. Await pathology results.  2. Outpatient  followup in the next two weeks for further recommendations.  3.     Continue proton pump inhibitors for now.  4. Avoid all nonsteroidals including aspirin.  Anselmo Rod, M.D.    JNM/MEDQ  D:  12/04/2002  T:  12/04/2002  Job:  [id number]   cc:   Marne A. Tower, M.D. [facility]  [address], [place]  [place] [address]  Fax: 1

## 2011-05-06 NOTE — Discharge Summary (Signed)
NAMEIRETTA, MANGRUM                  ACCOUNT NO.:  [id number]   MEDICAL RECORD NO.:  [id number]          PATIENT TYPE:  INP   LOCATION:  3709                         FACILITY:  [facility]   PHYSICIAN:  Willa Rough, M.D.     DATE OF BIRTH:  Apr 25, 1920   DATE OF ADMISSION:  09/23/2004  DATE OF DISCHARGE:  09/24/2004                                 DISCHARGE SUMMARY   DISCHARGE DIAGNOSIS:  Atypical chest pain status post cardiac  catheterization.   PAST MEDICAL HISTORY:  1.  Appendectomy.  2.  Cholecystectomy.  3.  Other abdominal surgery.  4.  Peptic ulcer disease.  5.  Gastroesophageal reflux disease.  6.  Barrett's esophagus.  7.  Billroth I surgery (abdominal surgery).  8.  Arthritis.  9.  Urinary tract infections.  10. Left bundle branch block.   PROCEDURE:  1.  Chest x-ray:  Lungs clear. No acute distress on the 6th.  2.  Cardiac catheterization September 24, 2004 performed by Dr. Randa Evens.   HISTORY OF PRESENT ILLNESS:  The patient seen in [facility]  emergency room on September 23, 2004 with complaints of decreased energy and  feeling more tired than usual, extended to chest tightness and mid sternal  chest pain around 8:00 p.m. the night before admission. The patient states  that some relief with aspirin and Tylenol products. Dr. Jerral Bonito saw the  patient initially in the emergency room and decided to admit patient for  cardiac catheterization. At that time, she had a blood pressure of 152/81,  pulse of 86. Electrocardiogram, sinus rhythm with a PR of 178, QRS of 122,  QTC 484 with left bundle branch block.   LABORATORY DATA:  Initial lab work:  Hemoglobin and hematocrit of 12.6 and  36.9. Potassium 4.1, BUN 17, creatinine 0.3. Point of care enzymes negative.   HOSPITAL COURSE:  Plan was to admit the patient to telemetry. Cycle cardiac  enzymes including C-met, CBC, PT and PTT. Chest x-ray, which is done. Check  a urine on her. She was also started on a heparin  drip per pharmacy with a  low dose bolus. We continued her medications from home including her  Prevacid, Diovan, Lovastatin, HCTZ, Triamterene, aspirin, Lopressor, and  Colace, along with the standing cardiac p.r.n. orders. The patient was taken  to the cardiac catheterization lab by Dr. Samule Ohm on the 6th. Ejection  fraction greater than 65%. Minimal coronary artery disease. Suspect chest  pain etiology, not cardiac. Instructed the patient to followup with Dr.  Milinda Antis. The patient tolerated the catheterization without any complications.  Vital signs are stable.   DISPOSITION:  The patient is being discharged home on the same medications  prior to this admission with instructions as stated. Pain management, for  general discomfort.   ACTIVITY:  No driving or strenuous activity x2 days. No lifting over 10  pounds x1 week.   DIET:  As previously followed.   WOUND CARE:  Gently clean catheterization site with soap and water. No tub  bathing or swimming x1 week.   SPECIAL INSTRUCTIONS:  She is to call our office for any fever, pain or  bleeding from catheterization site.   FOLLOW UP:  She has a followup appointment with Dr. Myrtis Ser on October 07, 2004  at 2:00 p.m. and Dr. Milinda Antis as previously instructed prior to this admission.       MB/MEDQ  D:  09/24/2004  T:  09/25/2004  Job:  [id number]   cc:   Marne A. Milinda Antis, M.D. [facility]   Willa Rough, M.D.

## 2011-05-06 NOTE — Procedures (Signed)
[place]  Patient:    Ellen Peterson, Ellen Peterson                 MRN: [id number] Proc. Date: 11/22/00 Adm. Date:  [id number] Attending:  Charna Elizabeth                           Procedure Report  REFERRING PHYSICIAN:  Roxy Manns, M.D.  PROCEDURE PERFORMED:  Esophagogastroduodenoscopy with biopsy.  ENDOSCOPIST:  Anselmo Rod, M.D.  INSTRUMENT USED:  Olympus video panendoscope.  INDICATION FOR PROCEDURE:  Significant epigastric pain in a 77 year old white white female.  She is status post ______ for ulcerative disease in the remote past, rule out recurrent ulcers.  PREPROCEDURE PREPARATION:  Informed consent was procured from the patient. The patient was fasted for eight hours prior to the procedure.  PREPROCEDURE PHYSICAL:  VITAL SIGNS:  The patient has stable vital signs.  NECK:  Supple.  LUNGS:  Chest was clear to auscultation.  No rhonchi or wheezing.  CARDIAC:  S1, S2 is regular.  No murmurs, rubs, or gallops.  ABDOMEN:  Abdomen soft, nondistended.  There is a well-healed surgical scar in the midline above the umbilicus.  Epigastric tenderness on palpation, with guarding; no rebound or rigidity.  Ho hepatosplenomegaly. DESCRIPTION OF PROCEDURE:  The patient was placed in the left lateral decubitus position, and sedated with 40 mcg of fentanyl and 4 mg of Versed intravenously.  Once the patient was adequately sedated, she was maintained on low-flow oxygen and continuous cardiac monitoring.  The Olympus video panendoscope was advanced through the mouthpiece over the tongue and into the esophagus under direct vision.  The proximal and the mid esophagus appeared normal.  There was some irregularity of the Z-line; it was biopsied to rule out Barretts.  No stricture, erosions, ulcerations or masses were seen in the esophagus.  The scope was then advanced into the stomach.  The gastric mucosa was hyperemic, consistent with B1 surgery in  the past.  No frank ulcers were seen. No erosions were present.  There was a small hiatal hernia seen on retroflexion.  A small suture noted at the anastomosis, which appeared healthy and without lesions.  The proximal small bowel also appeared normal, up to 60 cm.  The patient tolerated the procedure well without complications.  IMPRESSION: 1. Questioned Barretts mucosa in distal esophagus.  Biopsies done, results    pending.  Some irregularity to the Z-line. 2. Small hiatal hernia. 3. Hyperemic gastric mucosa, consistent with B1 surgery. 4. Proximal small bowel is normal up to 60 cm.  RECOMMENDATIONS: 1. Continue ______ for now. 2. Avoid nonsteroidals. 3. Await pathology results. 4. Outpatient follow up in the next two weeks.  The scope was then advanced into the stomach.  The entire ______ ______ appeared healthy.  No erosions, ulcerations, masses or polyps were present. The duodenal bulb appeared healthy.  There was erythematous changes in the small bowel, distal to the bowel, that was biopsied for pathology to rule out inflammatory bowel disease.  The patient tolerated the procedure well without complications.  IMPRESSION:  Erythematous changes in the porous small bowel area.  Biopsied for to rule out inflammatory bowel disease.  RECOMMENDATIONS: 1. Proceed with colonoscopy at this time. 2. Await pathology results. 3. Outpatient follow-up in the next two weeks.DD:  11/22/00 TD:  11/22/00 Job: [id number]

## 2011-05-06 NOTE — H&P (Signed)
Ellen Peterson, Ellen Peterson                  ACCOUNT NO.:  [id number]   MEDICAL RECORD NO.:  [id number]          PATIENT TYPE:  INP   LOCATION:  1823                         FACILITY:  [facility]   PHYSICIAN:  Willa Rough, M.D.     DATE OF BIRTH:  04/09/1920   DATE OF ADMISSION:  09/23/2004  DATE OF DISCHARGE:                                HISTORY & PHYSICAL   PRIMARY CARDIOLOGIST:  Dr. Jerral Bonito.   PRIMARY CARE PHYSICIAN:  Dr. Roxy Manns at [facility].   CHIEF COMPLAINT:  Chest pain.   HISTORY OF PRESENT ILLNESS:  The patient states that all day yesterday she  had decreased energy level, felt more tired than usual.  Around 8 p.m. last  night, while watching TV, the patient experienced chest tightness with  midsternal chest pain.  The patient states she took 81 mg of aspirin and  rested, and the pain was relieved.  She was awakened around midnight  complaining of jaw pain and neck discomfort.  The patient took two Extra  Strength Tylenol and went back to sleep.  She awoke this morning with chest  tightness, complaining of a squeezing sensation, almost a tugging/pulling  sensation at times that was constant.  She denied shortness of breath or  nausea with this.  She did complain of generalized weakness.  She states it  was hard to take a deep breath related to the chest discomfort.  Her legs  felt very weak.  She called her daughter, who came over and brought the  patient to the emergency room at [facility].  The patient does complain of  increased shortness of breath with exertion over the last few weeks.   PAST MEDICAL HISTORY:  This patient was just seen for initial visit with Dr.  Jerral Bonito at our [facility] office on October 3 for consult due to  questionable EKG changes noted by Dr. Alphonsus Sias and Dr. Milinda Antis at [facility]  office.  At that time, Dr. Myrtis Ser had planned to do an echocardiogram on the  patient to assess for LV function, and then possibly depending on the  results, do a  Cardiolite scan follow-up.  However, the patient arrived here  today with new complaints of chest discomfort.   Her past medical history includes a GI workup for Barrett's esophagus, some  GI ulcers, and a Billroth I type surgery.  She has had an appendectomy and a  cholecystectomy.  She has had peptic ulcer disease.  Also, a history of  GERD, generalized arthritis, UTIs, and left bundle-branch on EKG.  It sounds  as if the left bundle-branch block is new.   ALLERGIES:  TETRACYCLINE, SULFA, FLAGYL, MACRODANTIN.   MEDICATIONS:  1.  Prevacid 30 mg p.o. b.i.d.  2.  Diovan 80 mg p.o. daily.  3.  Hydrochlorothiazide 25/triamterene 37.5 mg one p.o. daily.  4.  Lovastatin 20 mg p.o. daily.   The patient has no prior cardiac catheterization history.  Left ventricular  ejection fraction unknown.   SOCIAL HISTORY:  The patient lives in [place].  She lives alone, states  she has a grandson that lives with her occasionally.  She is a widow with  four adult children who are alive and well.  Denies tobacco use.  Denies  exercise other than housework.  Denies ETOH use.  No diet restrictions.  She  denies drug or herbal medication use.   FAMILY HISTORY:  Her mother is deceased at age 29, possibly from  complications of MI and a CVA.  Her father is deceased at age 66, unknown  cause of death.  Siblings:  She has six siblings, three of whom are deceased  - a sister who died from cancer; a brother who died from an MI, CVA,  diabetes complication; and a brother who died from being old, possibly GI  bleed.   REVIEW OF SYSTEMS:  HEENT:  Positive for visual loss.  The patient states  she has been worked up for cataracts.  CARDIOPULMONARY:  Positive for chest  pain as described above, shortness of breath, dyspnea on exertion,  palpitations.  The patient states that at times she feels like she has bugs  in her chest.  Positive for cough, sporadic.  GU:  Positive for frequency  and urgency and  nocturia 2-3 times a night.  NEURO/PSYCH:  Positive for  weakness as described above.  GI:  Positive for GERD-type symptoms if does  not take Prevacid.  Abdominal pain with constipation.  Bowel habits:  Increased constipation.   PHYSICAL EXAMINATION:  VITAL SIGNS:  Temperature 97.9, pulse 86,  respirations 20, blood pressure 153/81, saturating 98% on room air.  GENERAL:  She is alert and oriented, no acute distress.  NECK:  Supple without lymphadenopathy.  Negative for TMJ, negative for  bruit, negative for JVD.  CARDIOVASCULAR:  Heart rate regular rhythm, S1, S2.  Pulses 2+ and equal  bilateral x4 extremities.  LUNGS:  Clear to auscultation bilateral.  ABDOMEN:  Soft, positive bowel sounds.  She does have some tenderness in the  left lower side.  MUSCULOSKELETAL:  No joint deformity or fusions noted.  No spine or CVA  tenderness.  NEUROLOGIC:  Alert and oriented x3.  Cranial nerves II-XII grossly intact.   Chest x-ray:  Lungs clear, no active disease.  EKG:  Rate of 101, sinus  rhythm, left bundle-branch block.  PRS 178, QRS 122, QTC 484.   LABORATORY DATA:  WBC 8.9, hemoglobin 12.6, hematocrit 36.9, platelets 327.  Chemistry is pending.  Point-of-care markers x1:  Troponin less than 0.05,  myoglobin 77.2, CK-MB 1.0.  PTT is 38, PT 12.4, INR 0.9.   The patient seen and examined by myself and Dr. Myrtis Ser.  Office consultation  note as of September 20, 2004 included.  Dr. Myrtis Ser has reviewed data with  myself, noted left bundle-branch block.  He states her pain is very  difficult to assess but now it is clear that catheterization is needed.  He  spoke with the patient and her daughter.  The patient presently declines  cardiac catheterization.  We will admit the patient, however, with  impression:  1.  Chest pain.  2.  History of fatigue.  3.  Gastroesophageal reflux disease.  4.  History of bladder infections.  5.  History of arthritis. 6.  History of allergy to SULFA, TETRACYCLINE,  FLAGYL, and MACRODANTIN.  7.  Left bundle-branch block.   1.  She will be admitted to telemetry.  2.  We will cycle cardiac enzymes, check a CMET, CBC, PT, PTT, portable      chest x-ray -  which has been done.  3.  Will also do a urinalysis to make sure she does not have a UTI again.  4.  We will continue her home medications for now with the addition of a      beta blocker.  5.  Cardiac catheterization needed.  6.  Prophylactic treatment with heparin drip.   PLAN:  Continue the aspirin, heparin, beta blocker.  Continue her Diovan,  Lovastatin, Prevacid, hydrochlorothiazide, and triamterene, and if cardiac  enzymes are positive will be included to do cardiac catheterization.  We  will speak with the patient and her daughters as we progress.  We will  continue to monitor.       MB/MEDQ  D:  09/23/2004  T:  09/23/2004  Job:  [id number]

## 2011-05-06 NOTE — Cardiovascular Report (Signed)
Ellen Peterson, Ellen Peterson                  ACCOUNT NO.:  [id number]   MEDICAL RECORD NO.:  [id number]          PATIENT TYPE:  INP   LOCATION:  3709                         FACILITY:  [facility]   PHYSICIAN:  Salvadore Farber, M.D. LHCDATE OF BIRTH:  April 13, 1920   DATE OF PROCEDURE:  DATE OF DISCHARGE:  09/24/2004                              CARDIAC CATHETERIZATION   DATE OF PROCEDURE:  September 24, 2004.   PROCEDURE:  Left heart catheterization, left ventriculography, coronary  angiography.   INDICATION:  [name] is an 77 year old lady, who presents with chest  discomfort with relatively atypical features, but occurring relatively  frequently.  Electrocardiogram demonstrates a left bundle branch block.  She  is admitted to hospital with the same discomfort and is referred for  diagnostic angiography.  She was ruled out for myocardial infarction.   PROCEDURAL TECHNIQUE:  Informed consent was obtained.  Under 1% lidocaine  local anesthesia, a 6-French sheath was placed in the right common femoral  artery using the modified Seldinger technique.  Diagnostic angiography and  ventriculography were performed using JR4, JL4, and pigtail catheters.  The  patient tolerated the procedure well and was transferred to the holding room  in stable condition.  Sheaths will be removed there.   COMPLICATIONS:  None.   FINDINGS:  1.  LV:  181/3/19.  EF greater than 65% without regional wall motion      abnormality.  2.  No aortic stenosis or mitral regurgitation.  3.  Left main:  Angiographically normal.  4.  LAD:  Moderate-sized vessel giving rise to a single diagonal branch.      There are only mild luminal irregularities along its course.  5.  Circumflex:  Large vessel giving rise to 2 large obtuse marginals. It is      angiographically normal.  6.  RCA:  A relatively small though dominant vessel, which is      angiographically normal.   IMPRESSION/RECOMMENDATIONS:  [name] has minimal coronary disease  with  normal left ventricular size and systolic function.  No aortic stenosis or  mitral regurgitation.  I suspect that her gastroesophageal reflux is the  cause of her chest discomfort.  Patient will be continued on her PPI and  follow up with Dr. Milinda Antis.       WED/MEDQ  D:  09/24/2004  T:  09/24/2004  Job:  [id number]   cc:   Marne A. Milinda Antis, M.D. [facility]   Willa Rough, M.D.

## 2011-06-01 ENCOUNTER — Other Ambulatory Visit: Payer: Self-pay | Admitting: *Deleted

## 2011-06-01 MED ORDER — ISOSORBIDE MONONITRATE ER 30 MG PO TB24: 30.0000 mg | ORAL_TABLET | Freq: Every day | ORAL | Status: DC

## 2011-06-02 ENCOUNTER — Encounter: Payer: Self-pay | Admitting: Family Medicine

## 2011-06-03 ENCOUNTER — Encounter: Payer: Self-pay | Admitting: Family Medicine

## 2011-06-03 ENCOUNTER — Ambulatory Visit (INDEPENDENT_AMBULATORY_CARE_PROVIDER_SITE_OTHER): Payer: Medicare Other | Admitting: Family Medicine

## 2011-06-03 DIAGNOSIS — M1990 Unspecified osteoarthritis, unspecified site: Secondary | ICD-10-CM

## 2011-06-03 DIAGNOSIS — I872 Venous insufficiency (chronic) (peripheral): Secondary | ICD-10-CM

## 2011-06-03 NOTE — Assessment & Plan Note (Signed)
With edema of legs / ankles  Compressible veins- nt and no ulcers  Pt declines supp stockings of any kind No chf symptoms  Disc lifstyle measures / see inst

## 2011-06-03 NOTE — Progress Notes (Signed)
Subjective:    Patient ID: Ellen Peterson, female    DOB: 1920-09-23, 77 y.o.   MRN: [id number]  HPI Here for swelling of feet  Also needs a px for a lift chair -- for her OA- cannot get off the couch  Bad OA in knees- undergoing injections  Has slid off on the floor before   Worse feet swelling about a month ago  occ feet look a little flushed  ? If worse at the end of the day  Hard to get her shoes   Has bad varicose veins- they do not hurt No ulceration  She cannot tolerate any supp hose at all   Chest pains about the same as before  No new sob   Is on hctz 25 Also low dose beta blocker Also imdur    In past saw cardiol for cp and decided against any aggressive w/u Notable for mild L ventricular dysf / with EF of 40%  Wt is down 3 lb  No nsaids   Arthritis is worse and worse- esp in knees Getting inj from ortho Unable to get out of any chair without asst and has fallen several times  Wants a px for a lift chair   Patient Active Problem List  Diagnoses  . HERPES SIMPLEX INFECTION  . COLONIC POLYPS, ADENOMATOUS  . UNSPECIFIED VITAMIN D DEFICIENCY  . HYPERLIPIDEMIA  . UNSPECIFIED ANEMIA  . HYPERTENSION  . LBBB  . PERIPHERAL VASCULAR DISEASE  . GERD  . DIVERTICULOSIS, COLON  . IBS  . RENAL INSUFFICIENCY  . UTI'S, RECURRENT  . OSTEOARTHRITIS  . POLYMYALGIA RHEUMATICA  . FATIGUE  . HYPERGLYCEMIA  . Venous insufficiency   Past Medical History  Diagnosis Date  . Diabetes mellitus     type II  . Diverticulosis of colon   . GERD (gastroesophageal reflux disease)   . Hyperlipidemia   . Hypertension   . Arthritis     OA  . Peripheral vascular disease   . PMR (polymyalgia rheumatica)   . Frequent UTI   . Chest pain   . Wide-complex tachycardia     SVT in past  . Hypokalemia    Past Surgical History  Procedure Date  . Appendectomy 1993  . Cholecystectomy   . Colectomy 1993    partial  . Abdominal hysterectomy 1960s    fibroids total  . Vein  ligation and stripping   . Bladder tack up   . Back surgery   . Esophagogastroduodenoscopy 05/2004  . Bilateral pyelonephritis    History  Substance Use Topics  . Smoking status: Never Smoker   . Smokeless tobacco: Not on file  . Alcohol Use: No   Family History  Problem Relation Age of Onset  . Heart disease Mother     CAD  . Hypertension Mother   . Stroke Mother   . Cancer Sister     pancreatic CA  . Diabetes Brother    Allergies  Allergen Reactions  . Erythromycin     REACTION: Rash  . Metformin     REACTION: vomiting and diarrhea  . Metronidazole     REACTION: whelps  . Nitrofurantoin     REACTION: Reaction not known  . Nsaids     REACTION: GI symptoms  . Rofecoxib     REACTION: GI symptoms  . Simvastatin     REACTION: myalgia  . Sulfamethoxazole W/Trimethoprim     REACTION: itching  . Tetracycline     REACTION: Rash  Current Outpatient Prescriptions on File Prior to Visit  Medication Sig Dispense Refill  . aspirin 81 MG tablet Take 81 mg by mouth daily.        . cholecalciferol (VITAMIN D) 1000 UNITS tablet Take 1,000 Units by mouth daily.        Marland Kitchen darifenacin (ENABLEX) 7.5 MG 24 hr tablet Take 7.5 mg by mouth daily.        . hydrochlorothiazide 25 MG tablet Take 25 mg by mouth daily.        . isosorbide mononitrate (IMDUR) 30 MG 24 hr tablet Take 1 tablet (30 mg total) by mouth daily.  30 tablet  11  . metoprolol tartrate (LOPRESSOR) 25 MG tablet Take 25 mg by mouth 2 (two) times daily.        . Multiple Vitamins-Minerals (ICAPS) CAPS Take 1 capsule by mouth daily.       . Omega-3 Fatty Acids (FISH OIL) 1000 MG CAPS Take 1 capsule by mouth daily.        . pantoprazole (PROTONIX) 40 MG tablet Take 1 tablet (40 mg total) by mouth 2 (two) times daily.  30 tablet  6  . traMADol (ULTRAM) 50 MG tablet Take 50 mg by mouth every 8 (eight) hours as needed.        . trimethoprim (TRIMPEX) 100 MG tablet Take 100 mg by mouth daily.        . valsartan (DIOVAN) 80 MG  tablet Take 1 tablet (80 mg total) by mouth daily.  90 tablet  3  . nitroGLYCERIN (NITROSTAT) 0.4 MG SL tablet Place 0.4 mg under the tongue. As directed and as needed.           Review of Systems Review of Systems  Constitutional: Negative for fever, appetite change, fatigue and unexpected weight change.  Eyes: Negative for pain and visual disturbance.  Respiratory: Negative for cough and shortness of breath.   Cardiovascular: Negative.  for cp or new sob above baseline  Gastrointestinal: Negative for nausea, diarrhea and constipation.  Genitourinary: Negative for urgency and frequency.  Skin: Negative for pallor.  MSK pos for severe knee and hip pain from arthritis with diff ambulating / no swellen joints  Neurological: Negative for weakness, light-headedness, numbness and headaches.  Hematological: Negative for adenopathy. Does not bruise/bleed easily.  Psychiatric/Behavioral: Negative for dysphoric mood. The patient is not nervous/anxious.          Objective:   Physical Exam  Constitutional: She appears well-developed and well-nourished. No distress.  HENT:  Head: Normocephalic and atraumatic.  Mouth/Throat: Oropharynx is clear and moist.  Eyes: Conjunctivae and EOM are normal. Pupils are equal, round, and reactive to light.  Neck: Normal range of motion. Neck supple. No JVD present. Carotid bruit is not present. No thyromegaly present.  Cardiovascular: Normal rate, regular rhythm, normal heart sounds and intact distal pulses.   Pulmonary/Chest: Effort normal and breath sounds normal. No respiratory distress. She has no wheezes.  Abdominal: Soft. Bowel sounds are normal. She exhibits no distension, no abdominal bruit and no mass. There is no tenderness.  Musculoskeletal: She exhibits edema. She exhibits no tenderness.       Poor rom knees (with crepitice) - pt unable to stand from chair without asst (even chair with arms)  Poor rom hips Ambulates slowly with cane Mild edema  with compressible varicosities below mid thigh  No ulceration  Lymphadenopathy:    She has no cervical adenopathy.  Neurological: She is alert. She has normal  reflexes.  Skin: Skin is warm and dry.  Psychiatric: She has a normal mood and affect.          Assessment & Plan:

## 2011-06-03 NOTE — Patient Instructions (Addendum)
Elevate legs above level of heart whenever you are sitting I think varicose veins are causing much of your swelling - especially in the heat  Do not sit with legs down or stand for long peroids of time  Avoid salt  Drink enough water  If you change your mind about support stockings- let me know  Here is your form for a lift chair - I think it is medically necessary

## 2011-06-03 NOTE — Assessment & Plan Note (Signed)
Worse and worse at knee Pt unable to rise from any chair without asst - even with arms Is undergoing inj for her knees Also had PT in past Px for lift chair given today- would be most helpful in retaining independence

## 2011-06-07 ENCOUNTER — Telehealth: Payer: Self-pay | Admitting: *Deleted

## 2011-06-07 NOTE — Telephone Encounter (Signed)
Rep from [facility] is asking for an order for a lift chair to go along with the the CMN that you filled out for patient.  Needs to be faxed to [phone], [place]et.

## 2011-06-07 NOTE — Telephone Encounter (Signed)
Order faxed.

## 2011-06-07 NOTE — Telephone Encounter (Signed)
Order done on px in IN box-thanks

## 2011-07-20 ENCOUNTER — Emergency Department (HOSPITAL_COMMUNITY)
Admission: EM | Admit: 2011-07-20 | Discharge: 2011-07-21 | Disposition: A | Discharge status: Home / Self Care | Payer: Medicare Other | Attending: Emergency Medicine | Admitting: Emergency Medicine

## 2011-07-20 ENCOUNTER — Other Ambulatory Visit: Payer: Self-pay | Admitting: *Deleted

## 2011-07-20 DIAGNOSIS — I10 Essential (primary) hypertension: Secondary | ICD-10-CM | POA: Insufficient documentation

## 2011-07-20 DIAGNOSIS — R112 Nausea with vomiting, unspecified: Secondary | ICD-10-CM | POA: Insufficient documentation

## 2011-07-20 DIAGNOSIS — N289 Disorder of kidney and ureter, unspecified: Secondary | ICD-10-CM | POA: Insufficient documentation

## 2011-07-20 DIAGNOSIS — R5381 Other malaise: Secondary | ICD-10-CM | POA: Insufficient documentation

## 2011-07-20 DIAGNOSIS — E119 Type 2 diabetes mellitus without complications: Secondary | ICD-10-CM | POA: Insufficient documentation

## 2011-07-20 DIAGNOSIS — E860 Dehydration: Secondary | ICD-10-CM | POA: Insufficient documentation

## 2011-07-20 MED ORDER — TRAMADOL HCL 50 MG PO TABS: 50.0000 mg | ORAL_TABLET | Freq: Three times a day (TID) | ORAL | Status: DC | PRN

## 2011-07-20 NOTE — Telephone Encounter (Signed)
Will refill electronically  

## 2011-07-21 LAB — URINALYSIS, ROUTINE W REFLEX MICROSCOPIC
Bilirubin Urine: NEGATIVE
Glucose, UA: NEGATIVE mg/dL
Hgb urine dipstick: NEGATIVE
Ketones, ur: NEGATIVE mg/dL
Leukocytes, UA: NEGATIVE
pH: 6 (ref 5.0–8.0)

## 2011-07-21 LAB — COMPREHENSIVE METABOLIC PANEL
ALT: 19 U/L (ref 0–35)
AST: 23 U/L (ref 0–37)
Albumin: 3.7 g/dL (ref 3.5–5.2)
Alkaline Phosphatase: 62 U/L (ref 39–117)
Chloride: 100 mEq/L (ref 96–112)
Potassium: 3.9 mEq/L (ref 3.5–5.1)
Sodium: 137 mEq/L (ref 135–145)
Total Protein: 7.4 g/dL (ref 6.0–8.3)

## 2011-07-21 LAB — CBC
MCHC: 32.2 g/dL (ref 30.0–36.0)
Platelets: 247 10*3/uL (ref 150–400)
RDW: 13.1 % (ref 11.5–15.5)
WBC: 9.4 10*3/uL (ref 4.0–10.5)

## 2011-07-21 LAB — DIFFERENTIAL
Basophils Relative: 0 % (ref 0–1)
Eosinophils Absolute: 0.1 10*3/uL (ref 0.0–0.7)
Eosinophils Relative: 1 % (ref 0–5)
Lymphs Abs: 1.2 10*3/uL (ref 0.7–4.0)
Neutrophils Relative %: 79 % — ABNORMAL HIGH (ref 43–77)

## 2011-08-16 ENCOUNTER — Other Ambulatory Visit: Payer: Self-pay | Admitting: *Deleted

## 2011-08-16 MED ORDER — METOPROLOL TARTRATE 25 MG PO TABS: 25.0000 mg | ORAL_TABLET | Freq: Two times a day (BID) | ORAL | Status: DC

## 2011-10-05 ENCOUNTER — Other Ambulatory Visit: Payer: Self-pay | Admitting: *Deleted

## 2011-10-05 MED ORDER — TRAMADOL HCL 50 MG PO TABS: 50.0000 mg | ORAL_TABLET | Freq: Three times a day (TID) | ORAL | Status: DC | PRN

## 2011-10-05 NOTE — Telephone Encounter (Signed)
Will refill electronically  

## 2011-10-05 NOTE — Telephone Encounter (Signed)
Faxed request from [facility], last filled 70 on 08/23/11

## 2011-10-20 ENCOUNTER — Ambulatory Visit (INDEPENDENT_AMBULATORY_CARE_PROVIDER_SITE_OTHER): Payer: Medicare Other

## 2011-10-20 DIAGNOSIS — Z23 Encounter for immunization: Secondary | ICD-10-CM

## 2011-11-28 ENCOUNTER — Ambulatory Visit (INDEPENDENT_AMBULATORY_CARE_PROVIDER_SITE_OTHER): Payer: Medicare Other | Admitting: Family Medicine

## 2011-11-28 ENCOUNTER — Encounter: Payer: Self-pay | Admitting: Family Medicine

## 2011-11-28 VITALS — BP 166/78 | HR 75 | Temp 97.6°F

## 2011-11-28 DIAGNOSIS — N390 Urinary tract infection, site not specified: Secondary | ICD-10-CM

## 2011-11-28 DIAGNOSIS — R112 Nausea with vomiting, unspecified: Secondary | ICD-10-CM

## 2011-11-28 LAB — POCT URINALYSIS DIPSTICK
Bilirubin, UA: NEGATIVE
Blood, UA: NEGATIVE
Glucose, UA: NEGATIVE
Ketones, UA: NEGATIVE
Nitrite, UA: NEGATIVE

## 2011-11-28 MED ORDER — CIPROFLOXACIN HCL 250 MG PO TABS: 250.0000 mg | ORAL_TABLET | Freq: Two times a day (BID) | ORAL | Status: AC

## 2011-11-28 MED ORDER — ONDANSETRON HCL 4 MG PO TABS: 4.0000 mg | ORAL_TABLET | Freq: Three times a day (TID) | ORAL | Status: AC | PRN

## 2011-11-28 NOTE — Patient Instructions (Addendum)
Take the zofran for nausea.  Take the cipro for now and stop the trimethoprim while on the cipro.  Restart the trimethoprim after the cipro is done.  Let us know if you aren't getting better gradually.  Thanks.

## 2011-11-28 NOTE — Progress Notes (Signed)
Dizzy this AM, only started today.  Fell this AM w/o LOC.  Was feeling normally yesterday.  H/o UTI with dysuria a few days ago.  She's had UTIs prev where she would get dizzy and nauseated.  On TMP for proph.  Dizzy = room spinning, but no presyncope sx.  No fevers.  No diarrhea. No dysuria this AM.  Vomitus = yellow liquid.  No blood in vomit.  The vertigo sx are some better now compared to earlier this AM, and she's usually better after vomiting.    Meds, vitals, and allergies reviewed.   ROS: See HPI.  Otherwise, noncontributory.  In wheelchair, nad, but has basin close by in case she needs to vomit (she didn't during the visit) Mmm Neck supple rrr ctab abd soft, not ttp except for mild lower abd tenderness, but no rebound, normal BS Ext well perfused  ua noted.

## 2011-11-29 ENCOUNTER — Encounter: Payer: Self-pay | Admitting: Family Medicine

## 2011-11-29 NOTE — Assessment & Plan Note (Signed)
Overall benign abd exam and given her history I would presume this was from UTI.  Check ucx, start cipro and hold trimethoprim for now.  Pt and family agree.  She has family to stay with her at home.  Okay for outpatient f/u.  She doesn't appear toxic or dehydrated.

## 2011-12-01 ENCOUNTER — Telehealth: Payer: Self-pay | Admitting: Family Medicine

## 2011-12-01 NOTE — Telephone Encounter (Signed)
Had a urine culture on Monday and called to find out about results.  Please call at [phone] or [phone]

## 2011-12-02 LAB — URINE CULTURE: Colony Count: >=100000

## 2011-12-02 NOTE — Telephone Encounter (Signed)
Pt said she has already been notified and pt is doing fine.

## 2011-12-04 ENCOUNTER — Telehealth: Payer: Self-pay | Admitting: Family Medicine

## 2011-12-04 NOTE — Telephone Encounter (Signed)
Please let pt know I had note from Dr Para March that she was interested in walker with seat .... Please ask them what medical supply they plan to get it from and what paperwork they require,thanks

## 2011-12-05 NOTE — Telephone Encounter (Signed)
Patient notified as instructed by telephone. Pt said to call [name] at [phone]. I left message for [name] to call me back.

## 2011-12-06 NOTE — Telephone Encounter (Signed)
Left v/m for [name] to call back.

## 2011-12-15 NOTE — Telephone Encounter (Signed)
Spoke with [name] and she said she will call Dr Milinda Antis if she needs anything. Since the initial call [name] has been told she can get equipment through the hospice store and it is a lot less expensive. [name] will call back if she needs anything from Dr Milinda Antis.

## 2012-02-15 ENCOUNTER — Telehealth: Payer: Self-pay | Admitting: Family Medicine

## 2012-02-15 NOTE — Telephone Encounter (Signed)
Triage Record Num: [id number] Operator: Tomasita Crumble Patient Name: Ellen Peterson Call Date & Time: 02/15/2012 1:53:26PM Patient Phone: [phone] PCP: Audrie Gallus. Tower Patient Gender: Female PCP Fax : Patient DOB: August 01, 1920 Practice Name: [facility] Reason for Call: Caller: Brenda/Other; PCP: Roxy Manns A.; CB#: [phone]; Call regarding Dizzy; Onset unclear, has had multiple episodes. Appt. at 0945 on 2/28 w/ Dr.Aron. Caller ageed;home care for the interim and parameters for callback given. Dizziness protocol used. Protocol(s) Used: Dizziness or Vertigo Recommended Outcome per Protocol: See Provider within 24 hours Reason for Outcome: Symptoms worsen with movement of head or looking up AND not previously evaluated Care Advice: Call EMS 911 if patient develops confusion, decreased level of consciousness, chest pain, shortness of breath, or focal neurologic abnormalities such as facial droop or weakness of one extremity. ~ Avoid caffeine (coffee, tea, cola drinks, or chocolate), alcohol, and nicotine (use of tobacco), as use of these substances may worsen symptoms. ~ ~ Call provider immediately if have difficulty walking, vision problems, or weakness. ~ CAUTIONS A temporary drop in blood pressure sometimes occurs with a quick change to an upright position (postural hypotension) and may cause light-headedness or dizziness. Change position slowly. Making a habit of rising slowly and sitting for a few minutes before standing to walk usually relieves the feeling of faintness. ~ When feeling faint, find a place to lie down if possible, and elevate legs 8 to 12 inches above the heart. If unable to lie down, sit in a chair and lower head between the knees for 3 to 5 minutes. If standing and not able to sit, cross legs and squeeze the knees together to move blood to the heart. ~ 02/15/2012 2:21:48PM Page 1 of 1 [id number]

## 2012-02-15 NOTE — Telephone Encounter (Signed)
Has appt. tomorrow

## 2012-02-16 ENCOUNTER — Ambulatory Visit (INDEPENDENT_AMBULATORY_CARE_PROVIDER_SITE_OTHER): Payer: Medicare Other | Admitting: Family Medicine

## 2012-02-16 ENCOUNTER — Other Ambulatory Visit: Payer: Self-pay | Admitting: *Deleted

## 2012-02-16 ENCOUNTER — Encounter: Payer: Self-pay | Admitting: Family Medicine

## 2012-02-16 VITALS — BP 140/70 | HR 64 | Temp 97.4°F | Wt 149.0 lb

## 2012-02-16 DIAGNOSIS — E785 Hyperlipidemia, unspecified: Secondary | ICD-10-CM

## 2012-02-16 DIAGNOSIS — R42 Dizziness and giddiness: Secondary | ICD-10-CM

## 2012-02-16 DIAGNOSIS — N390 Urinary tract infection, site not specified: Secondary | ICD-10-CM

## 2012-02-16 LAB — POCT URINALYSIS DIPSTICK
Blood, UA: NEGATIVE
Glucose, UA: NEGATIVE
Nitrite, UA: NEGATIVE
Urobilinogen, UA: NEGATIVE
pH, UA: 7

## 2012-02-16 LAB — CBC WITH DIFFERENTIAL/PLATELET
Basophils Absolute: 0 10*3/uL (ref 0.0–0.1)
Eosinophils Absolute: 0.2 10*3/uL (ref 0.0–0.7)
Lymphocytes Relative: 16.1 % (ref 12.0–46.0)
MCHC: 33.4 g/dL (ref 30.0–36.0)
Neutrophils Relative %: 74.3 % (ref 43.0–77.0)
RBC: 3.82 Mil/uL — ABNORMAL LOW (ref 3.87–5.11)
RDW: 13.9 % (ref 11.5–14.6)

## 2012-02-16 LAB — TSH: TSH: 1 u[IU]/mL (ref 0.35–5.50)

## 2012-02-16 LAB — BASIC METABOLIC PANEL
Chloride: 107 mEq/L (ref 96–112)
Potassium: 4.1 mEq/L (ref 3.5–5.1)

## 2012-02-16 MED ORDER — HYDROCHLOROTHIAZIDE 25 MG PO TABS: 25.0000 mg | ORAL_TABLET | Freq: Every day | ORAL | Status: DC

## 2012-02-16 MED ORDER — PANTOPRAZOLE SODIUM 40 MG PO TBEC: 40.0000 mg | DELAYED_RELEASE_TABLET | Freq: Two times a day (BID) | ORAL | Status: DC

## 2012-02-16 NOTE — Progress Notes (Signed)
78 yo pt of Dr. Milinda Antis new to me here for recurrent dizziness.  Notes reviewed, appears to baseline dizziness that is exacerbated by recurrent UTIs. ?recurrent vertigo as well.  Saw Dr. Para March for similar symptoms in December 2012 and was treated for UTI at that OV. Urine cx at that time was positive for pansensitive enterococcus treated with Cipro.  Has had some dysuria this morning.    On TMP for proph, followed by Dr. Isabel Caprice.  Yesterday woke up and room was spinning, but no presyncope sx.  No fevers.  No diarrhea.  Mild nausea, not vomiting this time.  No CP or SOB.  Patient Active Problem List  Diagnoses  . HERPES SIMPLEX INFECTION  . COLONIC POLYPS, ADENOMATOUS  . UNSPECIFIED VITAMIN D DEFICIENCY  . HYPERLIPIDEMIA  . UNSPECIFIED ANEMIA  . HYPERTENSION  . LBBB  . PERIPHERAL VASCULAR DISEASE  . GERD  . DIVERTICULOSIS, COLON  . IBS  . RENAL INSUFFICIENCY  . UTI'S, RECURRENT  . OSTEOARTHRITIS  . POLYMYALGIA RHEUMATICA  . FATIGUE  . HYPERGLYCEMIA  . Venous insufficiency  . Dizziness   Past Medical History  Diagnosis Date  . Diabetes mellitus     type II  . Diverticulosis of colon   . GERD (gastroesophageal reflux disease)   . Hyperlipidemia   . Hypertension   . Arthritis     OA  . Peripheral vascular disease   . PMR (polymyalgia rheumatica)   . Frequent UTI   . Chest pain   . Wide-complex tachycardia     SVT in past  . Hypokalemia    Past Surgical History  Procedure Date  . Appendectomy 1993  . Cholecystectomy   . Colectomy 1993    partial  . Abdominal hysterectomy 1960s    fibroids total  . Vein ligation and stripping   . Bladder tack up   . Back surgery   . Esophagogastroduodenoscopy 05/2004  . Bilateral pyelonephritis    History  Substance Use Topics  . Smoking status: Never Smoker   . Smokeless tobacco: Not on file  . Alcohol Use: No   Family History  Problem Relation Age of Onset  . Heart disease Mother     CAD  . Hypertension  Mother   . Stroke Mother   . Cancer Sister     pancreatic CA  . Diabetes Brother    Allergies  Allergen Reactions  . Erythromycin     REACTION: Rash  . Metformin     REACTION: vomiting and diarrhea  . Metronidazole     REACTION: whelps  . Nitrofurantoin     REACTION: Reaction not known  . Nsaids     REACTION: GI symptoms  . Rofecoxib     REACTION: GI symptoms  . Simvastatin     REACTION: myalgia  . Sulfamethoxazole W/Trimethoprim     REACTION: itching  . Tetracycline     REACTION: Rash   Current Outpatient Prescriptions on File Prior to Visit  Medication Sig Dispense Refill  . acetaminophen (TYLENOL) 500 MG tablet Take 500 mg by mouth every 6 (six) hours as needed.        Marland Kitchen aspirin 81 MG tablet Take 81 mg by mouth daily.        . cholecalciferol (VITAMIN D) 1000 UNITS tablet Take 1,000 Units by mouth daily.        Marland Kitchen CINNAMON PO Take 2 capsules by mouth daily. Each capsule is 1000mg .       . hydrochlorothiazide 25 MG  tablet Take 25 mg by mouth daily.        . isosorbide mononitrate (IMDUR) 30 MG 24 hr tablet Take 1 tablet (30 mg total) by mouth daily.  30 tablet  11  . metoprolol tartrate (LOPRESSOR) 25 MG tablet Take 1 tablet (25 mg total) by mouth 2 (two) times daily.  60 tablet  6  . Multiple Vitamins-Minerals (ICAPS) CAPS Take 1 capsule by mouth daily.       . nitroGLYCERIN (NITROSTAT) 0.4 MG SL tablet Place 0.4 mg under the tongue. As directed and as needed.       . Omega-3 Fatty Acids (FISH OIL) 1000 MG CAPS Take 1 capsule by mouth daily.        . pantoprazole (PROTONIX) 40 MG tablet Take 1 tablet (40 mg total) by mouth 2 (two) times daily.  30 tablet  6  . traMADol (ULTRAM) 50 MG tablet Take 1 tablet (50 mg total) by mouth every 8 (eight) hours as needed for pain.  90 tablet  3  . trimethoprim (TRIMPEX) 100 MG tablet Take 100 mg by mouth daily.        . valsartan (DIOVAN) 80 MG tablet Take 1 tablet (80 mg total) by mouth daily.  90 tablet  3  . vitamin E 400 UNIT  capsule Take 400 Units by mouth daily.        . cetirizine (ZYRTEC) 10 MG tablet Take 5 mg by mouth daily.        Marland Kitchen darifenacin (ENABLEX) 7.5 MG 24 hr tablet Take 7.5 mg by mouth daily.         The PMH, PSH, Social History, Family History, Medications, and allergies have been reviewed in [facility], and have been updated if relevant.  Meds, vitals, and allergies reviewed.   ROS: See HPI.  Otherwise, noncontributory.  Physical exam: BP 140/70  Pulse 64  Temp(Src) 97.4 F (36.3 C) (Oral)  Wt 149 lb (67.586 kg)  Gen:  Pleasant elderly female,nad but does appear pale to me (my first time seeing her) HEENT:  MMM, Neck supple CVS:  rrr Resp:  ctab abd soft, not ttp except for mild lower abd tenderness, but no rebound, normal BS Ext well perfused  ua noted neg.  Assessment and Plan: 1. Dizziness   Deteriorated. Recurrent issue- likely multifactorial.  Episodes seem to be consistent with vertigo. No UTI evident on UA today. Will check some labs today to rule out other possible contributing factors. If labs normal and symptoms persist, discussed possibly seeing ENT for work up and vestibular rehab. The patient indicates understanding of these issues and agrees with the plan.  POCT urinalysis dipstick, CBC with Differential, Basic Metabolic Panel, TSH

## 2012-02-16 NOTE — Patient Instructions (Signed)
Nice to meet you. Your urine was ok, I do not think this episode of dizziness is related to your urine. It might be an inner ear issue (vertigo) but we are checking some blood work today to rule out other possible causes.

## 2012-03-07 ENCOUNTER — Telehealth: Payer: Self-pay | Admitting: Family Medicine

## 2012-03-07 DIAGNOSIS — R42 Dizziness and giddiness: Secondary | ICD-10-CM

## 2012-03-07 NOTE — Telephone Encounter (Signed)
Pt is extremely dizzy and is not getting any better. Pt believes it may be Vertigo but she's not sure ??

## 2012-03-07 NOTE — Telephone Encounter (Signed)
Patient was seen on 02/16/2012 by Dr. Dayton Martes, in her note she stated that she wants patient to see an ENT.  Patient called today stating that she continues to have dizziness.  She woke up this morning and has been dizzy every since and she had several dizzy spells last week.  Patient agrees to see ENT.  Please advise.

## 2012-03-07 NOTE — Telephone Encounter (Signed)
I will ref to ENT now

## 2012-03-19 ENCOUNTER — Other Ambulatory Visit: Payer: Self-pay | Admitting: *Deleted

## 2012-03-19 MED ORDER — METOPROLOL TARTRATE 25 MG PO TABS: 25.0000 mg | ORAL_TABLET | Freq: Two times a day (BID) | ORAL | Status: DC

## 2012-03-20 ENCOUNTER — Other Ambulatory Visit: Payer: Self-pay | Admitting: *Deleted

## 2012-03-20 MED ORDER — TRAMADOL HCL 50 MG PO TABS: 50.0000 mg | ORAL_TABLET | Freq: Three times a day (TID) | ORAL | Status: DC | PRN

## 2012-03-20 NOTE — Telephone Encounter (Signed)
Last refill 02/16/2012.

## 2012-03-20 NOTE — Telephone Encounter (Signed)
Will refill electronically  

## 2012-03-22 ENCOUNTER — Telehealth: Payer: Self-pay

## 2012-03-22 NOTE — Telephone Encounter (Signed)
Received faxed request from [facility] for test strips. Pt said she has plenty of test strips now and does not need more at this time. Noted on form and faxed to [phone].

## 2012-04-04 ENCOUNTER — Other Ambulatory Visit: Payer: Self-pay | Admitting: Otolaryngology

## 2012-04-04 DIAGNOSIS — R42 Dizziness and giddiness: Secondary | ICD-10-CM

## 2012-04-04 DIAGNOSIS — H905 Unspecified sensorineural hearing loss: Secondary | ICD-10-CM

## 2012-04-07 ENCOUNTER — Ambulatory Visit
Admission: RE | Admit: 2012-04-07 | Discharge: 2012-04-07 | Disposition: A | Discharge status: Home / Self Care | Payer: Medicare Other | Source: Ambulatory Visit | Attending: Otolaryngology | Admitting: Otolaryngology

## 2012-04-07 DIAGNOSIS — H905 Unspecified sensorineural hearing loss: Secondary | ICD-10-CM

## 2012-04-07 DIAGNOSIS — R42 Dizziness and giddiness: Secondary | ICD-10-CM

## 2012-04-18 ENCOUNTER — Other Ambulatory Visit: Payer: Self-pay | Admitting: *Deleted

## 2012-04-18 MED ORDER — VALSARTAN 80 MG PO TABS: 80.0000 mg | ORAL_TABLET | Freq: Every day | ORAL | Status: DC

## 2012-04-18 NOTE — Telephone Encounter (Signed)
Received faxed refill request from pharmacy. Refill sent to pharmacy electronically. 

## 2012-05-15 ENCOUNTER — Ambulatory Visit (INDEPENDENT_AMBULATORY_CARE_PROVIDER_SITE_OTHER): Payer: Medicare Other | Admitting: Family Medicine

## 2012-05-15 ENCOUNTER — Encounter: Payer: Self-pay | Admitting: Family Medicine

## 2012-05-15 VITALS — BP 110/60 | HR 67 | Temp 97.7°F | Ht 62.0 in

## 2012-05-15 DIAGNOSIS — R300 Dysuria: Secondary | ICD-10-CM

## 2012-05-15 DIAGNOSIS — N390 Urinary tract infection, site not specified: Secondary | ICD-10-CM

## 2012-05-15 LAB — POCT UA - MICROSCOPIC ONLY: Casts, Ur, LPF, POC: 0

## 2012-05-15 LAB — POCT URINALYSIS DIPSTICK
Bilirubin, UA: NEGATIVE
Glucose, UA: NEGATIVE
Nitrite, UA: NEGATIVE
pH, UA: 5

## 2012-05-15 MED ORDER — CIPROFLOXACIN HCL 250 MG PO TABS: 250.0000 mg | ORAL_TABLET | Freq: Two times a day (BID) | ORAL | Status: AC

## 2012-05-15 NOTE — Assessment & Plan Note (Signed)
With pos ua and symptoms  Is on trimethoprim chronically-this seems to be breakthrough  Sees Dr Isabel Caprice cx sent  cipro for 7 d and fluids Update if not starting to improve in a week or if worsening

## 2012-05-15 NOTE — Patient Instructions (Signed)
Take the cipro as directed We will call you with urine culture result Drink lots of water Update if not starting to improve in a week or if worsening

## 2012-05-15 NOTE — Progress Notes (Signed)
Subjective:    Patient ID: Ellen Peterson, female    DOB: 1920/07/20, 78 y.o.   MRN: [id number]  HPI Here for uti Hx of frequent utis   Started getting symptoms on Friday  Heavy in bladder, then painful  Had chills on sat night  No nausea  Burns quite a bit to urinate No blood in urine   Back hurts down low   She takes trimethoprim daily (trial off of it did not go well)   The tramadol does help her urinary symptoms  Patient Active Problem List  Diagnoses  . HERPES SIMPLEX INFECTION  . COLONIC POLYPS, ADENOMATOUS  . UNSPECIFIED VITAMIN D DEFICIENCY  . HYPERLIPIDEMIA  . UNSPECIFIED ANEMIA  . HYPERTENSION  . LBBB  . PERIPHERAL VASCULAR DISEASE  . GERD  . DIVERTICULOSIS, COLON  . IBS  . RENAL INSUFFICIENCY  . UTI'S, RECURRENT  . OSTEOARTHRITIS  . POLYMYALGIA RHEUMATICA  . FATIGUE  . HYPERGLYCEMIA  . Venous insufficiency  . Dizziness   Past Medical History  Diagnosis Date  . Diabetes mellitus     type II  . Diverticulosis of colon   . GERD (gastroesophageal reflux disease)   . Hyperlipidemia   . Hypertension   . Arthritis     OA  . Peripheral vascular disease   . PMR (polymyalgia rheumatica)   . Frequent UTI   . Chest pain   . Wide-complex tachycardia     SVT in past  . Hypokalemia    Past Surgical History  Procedure Date  . Appendectomy 1993  . Cholecystectomy   . Colectomy 1993    partial  . Abdominal hysterectomy 1960s    fibroids total  . Vein ligation and stripping   . Bladder tack up   . Back surgery   . Esophagogastroduodenoscopy 05/2004  . Bilateral pyelonephritis    History  Substance Use Topics  . Smoking status: Never Smoker   . Smokeless tobacco: Not on file  . Alcohol Use: No   Family History  Problem Relation Age of Onset  . Heart disease Mother     CAD  . Hypertension Mother   . Stroke Mother   . Cancer Sister     pancreatic CA  . Diabetes Brother    Allergies  Allergen Reactions  . Erythromycin     REACTION: Rash   . Metformin     REACTION: vomiting and diarrhea  . Metronidazole     REACTION: whelps  . Nitrofurantoin     REACTION: Reaction not known  . Nsaids     REACTION: GI symptoms  . Rofecoxib     REACTION: GI symptoms  . Simvastatin     REACTION: myalgia  . Sulfamethoxazole W-Trimethoprim     REACTION: itching  . Tetracycline     REACTION: Rash   Current Outpatient Prescriptions on File Prior to Visit  Medication Sig Dispense Refill  . acetaminophen (TYLENOL) 500 MG tablet Take 500 mg by mouth every 6 (six) hours as needed.        Marland Kitchen aspirin 81 MG tablet Take 81 mg by mouth daily.        . cetirizine (ZYRTEC) 10 MG tablet Take 5 mg by mouth daily.        . cholecalciferol (VITAMIN D) 1000 UNITS tablet Take 1,000 Units by mouth daily.        Marland Kitchen CINNAMON PO Take 2 capsules by mouth daily. Each capsule is 1000mg .       . darifenacin (  ENABLEX) 7.5 MG 24 hr tablet Take 7.5 mg by mouth daily.        . hydrochlorothiazide (HYDRODIURIL) 25 MG tablet Take 1 tablet (25 mg total) by mouth daily.  30 tablet  6  . isosorbide mononitrate (IMDUR) 30 MG 24 hr tablet Take 1 tablet (30 mg total) by mouth daily.  30 tablet  11  . metoprolol tartrate (LOPRESSOR) 25 MG tablet Take 1 tablet (25 mg total) by mouth 2 (two) times daily.  60 tablet  6  . Multiple Vitamins-Minerals (ICAPS) CAPS Take 1 capsule by mouth daily.       . nitroGLYCERIN (NITROSTAT) 0.4 MG SL tablet Place 0.4 mg under the tongue. As directed and as needed.       . Omega-3 Fatty Acids (FISH OIL) 1000 MG CAPS Take 1 capsule by mouth daily.        . pantoprazole (PROTONIX) 40 MG tablet Take 1 tablet (40 mg total) by mouth 2 (two) times daily.  60 tablet  6  . traMADol (ULTRAM) 50 MG tablet Take 1 tablet (50 mg total) by mouth every 8 (eight) hours as needed for pain.  90 tablet  3  . trimethoprim (TRIMPEX) 100 MG tablet Take 100 mg by mouth daily.        . valsartan (DIOVAN) 80 MG tablet Take 1 tablet (80 mg total) by mouth daily.  90 tablet   0  . vitamin E 400 UNIT capsule Take 400 Units by mouth daily.            Review of Systems    Review of Systems  Constitutional: Negative for fever, appetite change,  and unexpected weight change. pos for fatigue  Eyes: Negative for pain and visual disturbance.  Respiratory: Negative for cough and shortness of breath.   Cardiovascular: Negative for cp or palpitations    Gastrointestinal: Negative for nausea, diarrhea and constipation.  Genitourinary: pos  for urgency and frequency. pos for dysuria  Skin: Negative for pallor or rash   Neurological: Negative for weakness, light-headedness, numbness and headaches.  Hematological: Negative for adenopathy. Does not bruise/bleed easily.  Psychiatric/Behavioral: Negative for dysphoric mood. The patient is not nervous/anxious.      Objective:   Physical Exam  Constitutional: She appears well-developed and well-nourished. No distress.  HENT:  Head: Normocephalic and atraumatic.  Mouth/Throat: Oropharynx is clear and moist.  Eyes: Conjunctivae and EOM are normal. Pupils are equal, round, and reactive to light. No scleral icterus.  Neck: Normal range of motion. Neck supple. No JVD present.  Cardiovascular: Normal rate and regular rhythm.   Pulmonary/Chest: Effort normal and breath sounds normal.  Abdominal: Soft. She exhibits no distension and no mass. There is tenderness. There is no rebound and no guarding.       Mild to moderate suprapubic tenderness  Musculoskeletal: She exhibits no edema.  Lymphadenopathy:    She has no cervical adenopathy.  Neurological: She is alert.  Skin: Skin is warm and dry. No rash noted. No erythema. No pallor.  Psychiatric: She has a normal mood and affect.          Assessment & Plan:

## 2012-05-18 LAB — URINE CULTURE: Colony Count: >=100000

## 2012-05-28 ENCOUNTER — Other Ambulatory Visit: Payer: Self-pay | Admitting: *Deleted

## 2012-05-28 MED ORDER — ISOSORBIDE MONONITRATE ER 30 MG PO TB24: 30.0000 mg | ORAL_TABLET | Freq: Every day | ORAL | Status: DC

## 2012-06-12 ENCOUNTER — Encounter: Payer: Self-pay | Admitting: Cardiology

## 2012-06-12 DIAGNOSIS — I10 Essential (primary) hypertension: Secondary | ICD-10-CM | POA: Insufficient documentation

## 2012-06-12 DIAGNOSIS — I447 Left bundle-branch block, unspecified: Secondary | ICD-10-CM | POA: Insufficient documentation

## 2012-06-12 DIAGNOSIS — I472 Ventricular tachycardia: Secondary | ICD-10-CM | POA: Insufficient documentation

## 2012-06-12 DIAGNOSIS — E785 Hyperlipidemia, unspecified: Secondary | ICD-10-CM | POA: Insufficient documentation

## 2012-06-13 ENCOUNTER — Ambulatory Visit (INDEPENDENT_AMBULATORY_CARE_PROVIDER_SITE_OTHER): Payer: Medicare Other | Admitting: Cardiology

## 2012-06-13 ENCOUNTER — Encounter: Payer: Self-pay | Admitting: Cardiology

## 2012-06-13 VITALS — BP 128/72 | HR 68 | Ht 62.0 in | Wt 145.0 lb

## 2012-06-13 DIAGNOSIS — R0989 Other specified symptoms and signs involving the circulatory and respiratory systems: Secondary | ICD-10-CM

## 2012-06-13 DIAGNOSIS — I10 Essential (primary) hypertension: Secondary | ICD-10-CM

## 2012-06-13 DIAGNOSIS — R42 Dizziness and giddiness: Secondary | ICD-10-CM

## 2012-06-13 DIAGNOSIS — R9430 Abnormal result of cardiovascular function study, unspecified: Secondary | ICD-10-CM

## 2012-06-13 DIAGNOSIS — I472 Ventricular tachycardia: Secondary | ICD-10-CM

## 2012-06-13 DIAGNOSIS — R0789 Other chest pain: Secondary | ICD-10-CM

## 2012-06-13 NOTE — Patient Instructions (Addendum)
Your physician wants you to follow-up in:  2 years. You will receive a reminder letter in the mail two months in advance. If you don't receive a letter, please call our office to schedule the follow-up appointment.   

## 2012-06-13 NOTE — Assessment & Plan Note (Signed)
Historically her ejection fraction is in the 40% range. She does have left bundle branch block. No further workup at this time.

## 2012-06-13 NOTE — Assessment & Plan Note (Signed)
The patient has not had any symptomatic arrhythmias. No further workup.

## 2012-06-13 NOTE — Assessment & Plan Note (Signed)
Blood pressure is controlled on her current medications. No change in therapy. 

## 2012-06-13 NOTE — Assessment & Plan Note (Signed)
She is not having any significant dizziness at this time. No further workup.

## 2012-06-13 NOTE — Assessment & Plan Note (Signed)
The patient does have a sensation of "tugginh"  in her chest that she describes. It is not clear that this represents angina. I have put her on a small dose of Imdur in the past. I do want to continue this. No other workup would be recommended at this time.

## 2012-06-13 NOTE — Progress Notes (Signed)
HPI The patient is 78 years of age. She is stable. She has some exertional shortness of breath. She also has what she describes as a tugging sensation in her chest. She's had this in the past. It really is not clear if this is angina or not. It is stable. She is here with her 2 daughters. I saw her last December, 2011. I have reviewed all of her prior cardiology history. I have completely updated the new electronic medical record concerning her cardiac status.  We know that she underwent cardiac catheterization in 2005. She had no significant coronary disease. She has an old left bundle branch block. Her ejection fraction historically has been in the 40% range. Her last echo was 2010. The fat that time was 40%.  Allergies  Allergen Reactions  . Erythromycin     REACTION: Rash  . Metformin     REACTION: vomiting and diarrhea  . Metronidazole     REACTION: whelps  . Nitrofurantoin     REACTION: Reaction not known  . Nsaids     REACTION: GI symptoms  . Rofecoxib     REACTION: GI symptoms  . Simvastatin     REACTION: myalgia  . Sulfamethoxazole W-Trimethoprim     REACTION: itching  . Tetracycline     REACTION: Rash    Current Outpatient Prescriptions  Medication Sig Dispense Refill  . acetaminophen (TYLENOL) 500 MG tablet Take 500 mg by mouth every 6 (six) hours as needed.        Marland Kitchen aspirin 81 MG tablet Take 81 mg by mouth daily.        . cetirizine (ZYRTEC) 10 MG tablet Take 5 mg by mouth daily.        . cholecalciferol (VITAMIN D) 1000 UNITS tablet Take 1,000 Units by mouth daily.        Marland Kitchen CINNAMON PO Take 2 capsules by mouth daily. Each capsule is 1000mg .       . darifenacin (ENABLEX) 7.5 MG 24 hr tablet Take 7.5 mg by mouth daily.        . hydrochlorothiazide (HYDRODIURIL) 25 MG tablet Take 1 tablet (25 mg total) by mouth daily.  30 tablet  6  . isosorbide mononitrate (IMDUR) 30 MG 24 hr tablet Take 1 tablet (30 mg total) by mouth daily.  30 tablet  0  . metoprolol tartrate  (LOPRESSOR) 25 MG tablet Take 1 tablet (25 mg total) by mouth 2 (two) times daily.  60 tablet  6  . Multiple Vitamins-Minerals (ICAPS) CAPS Take 1 capsule by mouth daily.       . nitroGLYCERIN (NITROSTAT) 0.4 MG SL tablet Place 0.4 mg under the tongue. As directed and as needed.       . Omega-3 Fatty Acids (FISH OIL) 1000 MG CAPS Take 1 capsule by mouth daily.        . pantoprazole (PROTONIX) 40 MG tablet Take 1 tablet (40 mg total) by mouth 2 (two) times daily.  60 tablet  6  . traMADol (ULTRAM) 50 MG tablet Take 1 tablet (50 mg total) by mouth every 8 (eight) hours as needed for pain.  90 tablet  3  . trimethoprim (TRIMPEX) 100 MG tablet Take 100 mg by mouth daily.        . valsartan (DIOVAN) 80 MG tablet Take 1 tablet (80 mg total) by mouth daily.  90 tablet  0  . vitamin E 400 UNIT capsule Take 400 Units by mouth daily.  History   Social History  . Marital Status: Married    Spouse Name: N/A    Number of Children: N/A  . Years of Education: N/A   Occupational History  . Not on file.   Social History Main Topics  . Smoking status: Never Smoker   . Smokeless tobacco: Not on file  . Alcohol Use: No  . Drug Use:   . Sexually Active:    Other Topics Concern  . Not on file   Social History Narrative  . No narrative on file    Family History  Problem Relation Age of Onset  . Heart disease Mother     CAD  . Hypertension Mother   . Stroke Mother   . Cancer Sister     pancreatic CA  . Diabetes Brother     Past Medical History  Diagnosis Date  . Diabetes mellitus     type II  . Diverticulosis of colon   . GERD (gastroesophageal reflux disease)   . Hyperlipidemia   . Hypertension   . Arthritis     OA  . Peripheral vascular disease   . PMR (polymyalgia rheumatica)   . Frequent UTI   . Chest tightness     catheterization, 2005, normal coronaries  . Wide-complex tachycardia     SVT in past  . Hypokalemia   . LBBB (left bundle branch block)     Since at  least 2005  . Ejection fraction < 50%     EF 40% echo, 09/2009    Past Surgical History  Procedure Date  . Appendectomy 1993  . Cholecystectomy   . Colectomy 1993    partial  . Abdominal hysterectomy 1960s    fibroids total  . Vein ligation and stripping   . Bladder tack up   . Back surgery   . Esophagogastroduodenoscopy 05/2004  . Bilateral pyelonephritis     ROS    Patient denies fever, chills, headache, sweats, rash, change in vision, change in hearing, cough, nausea vomiting, urinary symptoms. All other systems are reviewed and are negative.  PHYSICAL EXAM   The patient is stable sitting up or rolling chair. Her daughters are with her. She is oriented to person time and place. Affect is normal. There is no jugulovenous distention. There are no carotid bruits. Lungs are clear. Respiratory effort is nonlabored. Cardiac exam reveals S1 and S2. There are no clicks or significant murmurs. The abdomen is soft. There is no peripheral edema. There are no musculoskeletal deformities. There are no skin rashes.  Filed Vitals:   06/13/12 0904  BP: 128/72  Pulse: 68  Height: 5\' 2"  (1.575 m)  Weight: 145 lb (65.772 kg)      EKG is done today and reviewed by me.There is old left bundle branch block. There is sinus rhythm. There is no significant change. ASSESSMENT & PLAN

## 2012-06-14 ENCOUNTER — Ambulatory Visit: Payer: Medicare Other | Admitting: Cardiology

## 2012-07-03 ENCOUNTER — Other Ambulatory Visit: Payer: Self-pay | Admitting: *Deleted

## 2012-07-03 MED ORDER — ISOSORBIDE MONONITRATE ER 30 MG PO TB24: 30.0000 mg | ORAL_TABLET | Freq: Every day | ORAL | Status: DC

## 2012-07-30 ENCOUNTER — Other Ambulatory Visit: Payer: Self-pay | Admitting: Family Medicine

## 2012-08-27 ENCOUNTER — Other Ambulatory Visit: Payer: Self-pay | Admitting: Family Medicine

## 2012-08-27 NOTE — Telephone Encounter (Signed)
Will refill electronically  

## 2012-08-27 NOTE — Telephone Encounter (Signed)
Request for Tramadol 50 mg. Ok to refill? 

## 2012-09-27 ENCOUNTER — Other Ambulatory Visit: Payer: Self-pay | Admitting: Family Medicine

## 2012-10-25 ENCOUNTER — Ambulatory Visit (INDEPENDENT_AMBULATORY_CARE_PROVIDER_SITE_OTHER): Payer: Medicare Other

## 2012-10-25 DIAGNOSIS — Z23 Encounter for immunization: Secondary | ICD-10-CM

## 2012-10-30 ENCOUNTER — Other Ambulatory Visit: Payer: Self-pay | Admitting: Family Medicine

## 2012-11-28 ENCOUNTER — Other Ambulatory Visit: Payer: Self-pay | Admitting: Family Medicine

## 2012-11-28 NOTE — Telephone Encounter (Signed)
No future appt., no recent f/u or CPE appt or labs, ok to refill?

## 2012-11-28 NOTE — Telephone Encounter (Signed)
Please schedule a follow up in 6 months and refil until then, thanks

## 2012-11-28 NOTE — Telephone Encounter (Signed)
Scheduled appt an refill meds until then

## 2013-01-30 ENCOUNTER — Other Ambulatory Visit: Payer: Self-pay | Admitting: Family Medicine

## 2013-02-04 ENCOUNTER — Encounter: Payer: Self-pay | Admitting: Family Medicine

## 2013-02-04 ENCOUNTER — Ambulatory Visit (INDEPENDENT_AMBULATORY_CARE_PROVIDER_SITE_OTHER): Payer: Medicare Other | Admitting: Family Medicine

## 2013-02-04 VITALS — BP 118/64 | HR 68 | Temp 97.8°F | Ht 62.0 in | Wt 145.8 lb

## 2013-02-04 DIAGNOSIS — E785 Hyperlipidemia, unspecified: Secondary | ICD-10-CM

## 2013-02-04 DIAGNOSIS — I10 Essential (primary) hypertension: Secondary | ICD-10-CM

## 2013-02-04 DIAGNOSIS — R7309 Other abnormal glucose: Secondary | ICD-10-CM

## 2013-02-04 LAB — COMPREHENSIVE METABOLIC PANEL
ALT: 25 U/L (ref 0–35)
AST: 24 U/L (ref 0–37)
Albumin: 3.6 g/dL (ref 3.5–5.2)
CO2: 26 mEq/L (ref 19–32)
Calcium: 8.7 mg/dL (ref 8.4–10.5)
Chloride: 104 mEq/L (ref 96–112)
Creatinine, Ser: 1.4 mg/dL — ABNORMAL HIGH (ref 0.4–1.2)
GFR: 39.14 mL/min — ABNORMAL LOW (ref >60.00)
Potassium: 4.7 mEq/L (ref 3.5–5.1)
Sodium: 138 mEq/L (ref 135–145)
Total Protein: 6.4 g/dL (ref 6.0–8.3)

## 2013-02-04 LAB — CBC WITH DIFFERENTIAL/PLATELET
Basophils Absolute: 0 10*3/uL (ref 0.0–0.1)
Hemoglobin: 11.3 g/dL — ABNORMAL LOW (ref 12.0–15.0)
Lymphocytes Relative: 18.9 % (ref 12.0–46.0)
Monocytes Relative: 10.4 % (ref 3.0–12.0)
Neutro Abs: 4.9 10*3/uL (ref 1.4–7.7)
Neutrophils Relative %: 69.3 % (ref 43.0–77.0)
RDW: 14 % (ref 11.5–14.6)

## 2013-02-04 LAB — LDL CHOLESTEROL, DIRECT: Direct LDL: 142.2 mg/dL

## 2013-02-04 LAB — LIPID PANEL
Total CHOL/HDL Ratio: 4
VLDL: 24.2 mg/dL (ref 0.0–40.0)

## 2013-02-04 MED ORDER — PANTOPRAZOLE SODIUM 40 MG PO TBEC: 40.0000 mg | DELAYED_RELEASE_TABLET | Freq: Two times a day (BID) | ORAL | Status: DC

## 2013-02-04 MED ORDER — VALSARTAN 80 MG PO TABS: 80.0000 mg | ORAL_TABLET | Freq: Every day | ORAL | Status: DC

## 2013-02-04 NOTE — Assessment & Plan Note (Signed)
Due for check today- rev low sat fat diet No medicines at present

## 2013-02-04 NOTE — Assessment & Plan Note (Signed)
bp in fair control at this time  No changes needed  Disc lifstyle change with low sodium diet and exercise   Labs today 

## 2013-02-04 NOTE — Assessment & Plan Note (Signed)
Lab today  Disc low glycemic diet -pt does not pay too much attention to it

## 2013-02-04 NOTE — Progress Notes (Signed)
Subjective:    Patient ID: Ellen Peterson, female    DOB: 09/22/1920, 79 y.o.   MRN: [id number]  HPI Here for f/u of chronic medical problems   Thinks she is doing very well   Wt is stable   bp is stable today  No cp or palpitations or headaches or edema  No side effects to medicines  BP Readings from Last 3 Encounters:  02/04/13 118/64  06/13/12 128/72  05/15/12 110/60      Hyperlipidemia Lab Results  Component Value Date   CHOL 209* 03/25/2010   HDL 65.80 03/25/2010   LDLCALC  Value: 79        Total Cholesterol/HDL:CHD Risk Coronary Heart Disease Risk Table                     Men   Women  1/2 Average Risk   3.4   3.3  Average Risk       5.0   4.4  2 X Average Risk   9.6   7.1  3 X Average Risk  23.4   11.0        Use the calculated Patient Ratio above and the CHD Risk Table to determine the patient's CHD Risk.        ATP III CLASSIFICATION (LDL):  <100     mg/dL   Optimal  119-147  mg/dL   Near or Above                    Optimal  130-159  mg/dL   Borderline  829-562  mg/dL   High  >130     mg/dL   Very High 86/57/8469   LDLDIRECT 130.0 03/25/2010   TRIG 107.0 03/25/2010   CHOLHDL 3 03/25/2010  eats a lot of vegetable  Gets protein from peanut butter and eggs and cheese   Hx of renal insuff   Chemistry      Component Value Date/Time   NA 141 02/16/2012 1002   K 4.1 02/16/2012 1002   CL 107 02/16/2012 1002   CO2 26 02/16/2012 1002   BUN 24* 02/16/2012 1002   CREATININE 1.0 02/16/2012 1002      Component Value Date/Time   CALCIUM 9.1 02/16/2012 1002   ALKPHOS 62 07/21/2011 0005   AST 23 07/21/2011 0005   ALT 19 07/21/2011 0005   BILITOT 0.3 07/21/2011 0005     Also hx of chronic uti Has not had one since her last visit  occ symptoms - then gets better then next day  Is a good water drinker  (family says fair)- better when she gets symptoms   Hyperglycemia Lab Results  Component Value Date   HGBA1C 6.1 02/08/2010    Patient Active Problem List  Diagnosis  . HERPES SIMPLEX  INFECTION  . COLONIC POLYPS, ADENOMATOUS  . UNSPECIFIED VITAMIN D DEFICIENCY  . HYPERLIPIDEMIA  . UNSPECIFIED ANEMIA  . HYPERTENSION  . PERIPHERAL VASCULAR DISEASE  . GERD  . DIVERTICULOSIS, COLON  . IBS  . RENAL INSUFFICIENCY  . UTI'S, RECURRENT  . OSTEOARTHRITIS  . POLYMYALGIA RHEUMATICA  . FATIGUE  . HYPERGLYCEMIA  . Venous insufficiency  . Dizziness  . LBBB (left bundle branch block)  . Hyperlipidemia  . Hypertension  . Chest tightness  . Wide-complex tachycardia  . Ejection fraction < 50%   Past Medical History  Diagnosis Date  . Diabetes mellitus     type II  . Diverticulosis of colon   .  GERD (gastroesophageal reflux disease)   . Hyperlipidemia   . Hypertension   . Arthritis     OA  . Peripheral vascular disease   . PMR (polymyalgia rheumatica)   . Frequent UTI   . Chest tightness     catheterization, 2005, normal coronaries  . Wide-complex tachycardia     SVT in past  . Hypokalemia   . LBBB (left bundle branch block)     Since at least 2005  . Ejection fraction < 50%     EF 40% echo, 09/2009   Past Surgical History  Procedure Laterality Date  . Appendectomy  1993  . Cholecystectomy    . Colectomy  1993    partial  . Abdominal hysterectomy  1960s    fibroids total  . Vein ligation and stripping    . Bladder tack up    . Back surgery    . Esophagogastroduodenoscopy  05/2004  . Bilateral pyelonephritis     History  Substance Use Topics  . Smoking status: Never Smoker   . Smokeless tobacco: Not on file  . Alcohol Use: No   Family History  Problem Relation Age of Onset  . Heart disease Mother     CAD  . Hypertension Mother   . Stroke Mother   . Cancer Sister     pancreatic CA  . Diabetes Brother    Allergies  Allergen Reactions  . Erythromycin     REACTION: Rash  . Metformin     REACTION: vomiting and diarrhea  . Metronidazole     REACTION: whelps  . Nitrofurantoin     REACTION: Reaction not known  . Nsaids     REACTION: GI  symptoms  . Rofecoxib     REACTION: GI symptoms  . Simvastatin     REACTION: myalgia  . Sulfamethoxazole W-Trimethoprim     REACTION: itching  . Tetracycline     REACTION: Rash   Current Outpatient Prescriptions on File Prior to Visit  Medication Sig Dispense Refill  . acetaminophen (TYLENOL) 500 MG tablet Take 500 mg by mouth every 6 (six) hours as needed.        Marland Kitchen aspirin 81 MG tablet Take 81 mg by mouth daily.        . cetirizine (ZYRTEC) 10 MG tablet Take 5 mg by mouth daily.        . cholecalciferol (VITAMIN D) 1000 UNITS tablet Take 1,000 Units by mouth daily.        Marland Kitchen CINNAMON PO Take 2 capsules by mouth daily. Each capsule is 1000mg .       . darifenacin (ENABLEX) 7.5 MG 24 hr tablet Take 7.5 mg by mouth daily.        Marland Kitchen DIOVAN 80 MG tablet TAKE ONE (1) TABLET BY MOUTH EVERY      DAY  90 each  2  . hydrochlorothiazide (HYDRODIURIL) 25 MG tablet TAKE ONE (1) TABLET BY MOUTH EVERY DAY  30 tablet  0  . isosorbide mononitrate (IMDUR) 30 MG 24 hr tablet Take 1 tablet (30 mg total) by mouth daily.  30 tablet  11  . metoprolol tartrate (LOPRESSOR) 25 MG tablet TAKE ONE (1) TABLET BY MOUTH TWO (2) TIMES DAILY  60 tablet  0  . Multiple Vitamins-Minerals (ICAPS) CAPS Take 1 capsule by mouth daily.       . nitroGLYCERIN (NITROSTAT) 0.4 MG SL tablet Place 0.4 mg under the tongue. As directed and as needed.       Marland Kitchen  Omega-3 Fatty Acids (FISH OIL) 1000 MG CAPS Take 1 capsule by mouth daily.        . pantoprazole (PROTONIX) 40 MG tablet TAKE ONE (1) TABLET BY MOUTH TWO (2)    TIMES DAILY  60 tablet  2  . traMADol (ULTRAM) 50 MG tablet TAKE 1 TABLET EVERY 8 HOURS AS NEEDED   FOR PAIN  90 tablet  5  . trimethoprim (TRIMPEX) 100 MG tablet Take 100 mg by mouth daily.        . vitamin E 400 UNIT capsule Take 400 Units by mouth daily.         No current facility-administered medications on file prior to visit.    Review of Systems Review of Systems  Constitutional: Negative for fever, appetite  change, fatigue and unexpected weight change.  Eyes: Negative for pain and visual disturbance.  Respiratory: Negative for cough and shortness of breath.   Cardiovascular: Negative for cp or palpitations    Gastrointestinal: Negative for nausea, diarrhea and constipation.  Genitourinary: Negative for urgency and frequency. occ dysuria that resolves when she drinks more water  Skin: Negative for pallor or rash   Neurological: Negative for weakness, light-headedness, numbness and headaches.  Hematological: Negative for adenopathy. Does not bruise/bleed easily.  Psychiatric/Behavioral: Negative for dysphoric mood. The patient is not nervous/anxious.         Objective:   Physical Exam  Constitutional: She appears well-developed and well-nourished. No distress.  HENT:  Head: Normocephalic and atraumatic.  Mouth/Throat: Oropharynx is clear and moist.  Eyes: Conjunctivae and EOM are normal. Pupils are equal, round, and reactive to light. Right eye exhibits no discharge. Left eye exhibits no discharge. No scleral icterus.  Neck: Normal range of motion. Neck supple. Carotid bruit is not present. No thyromegaly present.  Cardiovascular: Normal rate, regular rhythm, normal heart sounds and intact distal pulses.  Exam reveals no gallop.   Pulmonary/Chest: Effort normal and breath sounds normal. No respiratory distress. She has no wheezes. She exhibits no tenderness.  Abdominal: Soft. Bowel sounds are normal. She exhibits no distension, no abdominal bruit and no mass. There is no tenderness.  Musculoskeletal: She exhibits no edema.  No cva tenderness  Lymphadenopathy:    She has no cervical adenopathy.  Neurological: She is alert. She has normal reflexes. No cranial nerve deficit. She exhibits normal muscle tone. Coordination normal.  Skin: Skin is warm and dry. No rash noted. No erythema. No pallor.  Psychiatric: She has a normal mood and affect.  Good mood and talkative today           Assessment & Plan:

## 2013-02-04 NOTE — Patient Instructions (Addendum)
No change in medicines I'm glad you are doing well  Labs today  Follow up in 6 months for annual exam (we will do labs that day)

## 2013-02-06 ENCOUNTER — Other Ambulatory Visit (INDEPENDENT_AMBULATORY_CARE_PROVIDER_SITE_OTHER): Payer: Medicare Other | Admitting: *Deleted

## 2013-02-06 ENCOUNTER — Telehealth: Payer: Self-pay | Admitting: Family Medicine

## 2013-02-06 ENCOUNTER — Encounter: Payer: Self-pay | Admitting: *Deleted

## 2013-02-06 LAB — POCT UA - MICROSCOPIC ONLY
Casts, Ur, LPF, POC: 0
Crystals, Ur, HPF, POC: 0

## 2013-02-06 LAB — POCT URINALYSIS DIPSTICK
Glucose, UA: NEGATIVE
Nitrite, UA: POSITIVE
Urobilinogen, UA: 0.2

## 2013-02-06 MED ORDER — CIPROFLOXACIN HCL 250 MG PO TABS: 250.0000 mg | ORAL_TABLET | Freq: Two times a day (BID) | ORAL | Status: DC

## 2013-02-06 NOTE — Telephone Encounter (Signed)
Pt notified of u/a results, and Rx called in to pharmacy

## 2013-02-06 NOTE — Telephone Encounter (Signed)
Let them know I am cx urine and it looks like uti Please call in cipro, thanks

## 2013-02-08 LAB — URINE CULTURE: Colony Count: >=100000

## 2013-02-11 ENCOUNTER — Ambulatory Visit (INDEPENDENT_AMBULATORY_CARE_PROVIDER_SITE_OTHER): Payer: Medicare Other | Admitting: Family Medicine

## 2013-02-11 ENCOUNTER — Encounter: Payer: Self-pay | Admitting: Family Medicine

## 2013-02-11 VITALS — BP 130/68 | HR 64 | Temp 97.6°F | Ht 62.0 in | Wt 147.0 lb

## 2013-02-11 DIAGNOSIS — D649 Anemia, unspecified: Secondary | ICD-10-CM | POA: Insufficient documentation

## 2013-02-11 LAB — POCT URINALYSIS DIPSTICK
Blood, UA: NEGATIVE
Ketones, UA: NEGATIVE
Protein, UA: NEGATIVE
Spec Grav, UA: 1.02

## 2013-02-11 LAB — CREATININE, SERUM: Creatinine, Ser: 1.2 mg/dL (ref 0.4–1.2)

## 2013-02-11 NOTE — Patient Instructions (Addendum)
Drink your water  Re checking kidney number cr (today)- hope this will be down  We will culture urine today and update you  Follow up with urology as planned You are slightly anemic- we will keep track of this  Follow up in about 6 months

## 2013-02-11 NOTE — Assessment & Plan Note (Signed)
Intermittent suspect due to chronic dz - pt wishes not to work up unless worse or sympt Will obs

## 2013-02-11 NOTE — Progress Notes (Signed)
Subjective:    Patient ID: Ellen Peterson, female    DOB: July 26, 1920, 79 y.o.   MRN: [id number]  HPI Here for f/u of uti  Was here for routing visit and labs last time and cr went up from 1 to 1.4 Returned a urine spec-pos for klebsiella infx   Has hx of chronic uti Takes daily trimethopril Sees Dr Isabel Caprice  This infx was resistant to trimethoprim She was given cipro 250 occ has emesis (per pt has for years)   Feels fine   Was a bit anemic  Lab Results  Component Value Date   WBC 7.1 02/04/2013   HGB 11.3* 02/04/2013   HCT 33.6* 02/04/2013   MCV 93.9 02/04/2013   PLT 292.0 02/04/2013    No abd pain No blood in stool  At age 12 pt does not want to work this up since she feels fine- has had anemia of chronic dz in the past  Patient Active Problem List  Diagnosis  . HERPES SIMPLEX INFECTION  . COLONIC POLYPS, ADENOMATOUS  . UNSPECIFIED VITAMIN D DEFICIENCY  . HYPERLIPIDEMIA  . UNSPECIFIED ANEMIA  . HYPERTENSION  . PERIPHERAL VASCULAR DISEASE  . GERD  . DIVERTICULOSIS, COLON  . IBS  . RENAL INSUFFICIENCY  . UTI'S, RECURRENT  . OSTEOARTHRITIS  . POLYMYALGIA RHEUMATICA  . HYPERGLYCEMIA  . Venous insufficiency  . LBBB (left bundle branch block)  . Hyperlipidemia  . Hypertension  . Chest tightness  . Wide-complex tachycardia  . Ejection fraction < 50%   Past Medical History  Diagnosis Date  . Diabetes mellitus     type II  . Diverticulosis of colon   . GERD (gastroesophageal reflux disease)   . Hyperlipidemia   . Hypertension   . Arthritis     OA  . Peripheral vascular disease   . PMR (polymyalgia rheumatica)   . Frequent UTI   . Chest tightness     catheterization, 2005, normal coronaries  . Wide-complex tachycardia     SVT in past  . Hypokalemia   . LBBB (left bundle branch block)     Since at least 2005  . Ejection fraction < 50%     EF 40% echo, 09/2009   Past Surgical History  Procedure Laterality Date  . Appendectomy  1993  . Cholecystectomy     . Colectomy  1993    partial  . Abdominal hysterectomy  1960s    fibroids total  . Vein ligation and stripping    . Bladder tack up    . Back surgery    . Esophagogastroduodenoscopy  05/2004  . Bilateral pyelonephritis     History  Substance Use Topics  . Smoking status: Never Smoker   . Smokeless tobacco: Not on file  . Alcohol Use: No   Family History  Problem Relation Age of Onset  . Heart disease Mother     CAD  . Hypertension Mother   . Stroke Mother   . Cancer Sister     pancreatic CA  . Diabetes Brother    Allergies  Allergen Reactions  . Erythromycin     REACTION: Rash  . Metformin     REACTION: vomiting and diarrhea  . Metronidazole     REACTION: whelps  . Nitrofurantoin     REACTION: Reaction not known  . Nsaids     REACTION: GI symptoms  . Rofecoxib     REACTION: GI symptoms  . Simvastatin     REACTION: myalgia  .  Sulfamethoxazole W-Trimethoprim     REACTION: itching  . Tetracycline     REACTION: Rash   Current Outpatient Prescriptions on File Prior to Visit  Medication Sig Dispense Refill  . acetaminophen (TYLENOL) 500 MG tablet Take 500 mg by mouth every 6 (six) hours as needed.        Marland Kitchen aspirin 81 MG tablet Take 81 mg by mouth daily.        . cetirizine (ZYRTEC) 10 MG tablet Take 5 mg by mouth daily.        . cholecalciferol (VITAMIN D) 1000 UNITS tablet Take 1,000 Units by mouth daily.        Marland Kitchen CINNAMON PO Take 2 capsules by mouth daily. Each capsule is 1000mg .       . darifenacin (ENABLEX) 7.5 MG 24 hr tablet Take 7.5 mg by mouth daily.        . hydrochlorothiazide (HYDRODIURIL) 25 MG tablet TAKE ONE (1) TABLET BY MOUTH EVERY DAY  30 tablet  0  . isosorbide mononitrate (IMDUR) 30 MG 24 hr tablet Take 1 tablet (30 mg total) by mouth daily.  30 tablet  11  . metoprolol tartrate (LOPRESSOR) 25 MG tablet TAKE ONE (1) TABLET BY MOUTH TWO (2) TIMES DAILY  60 tablet  0  . Multiple Vitamins-Minerals (ICAPS) CAPS Take 1 capsule by mouth daily.        . nitroGLYCERIN (NITROSTAT) 0.4 MG SL tablet Place 0.4 mg under the tongue. As directed and as needed.       . Omega-3 Fatty Acids (FISH OIL) 1000 MG CAPS Take 1 capsule by mouth daily.        . pantoprazole (PROTONIX) 40 MG tablet Take 1 tablet (40 mg total) by mouth 2 (two) times daily.  60 tablet  11  . traMADol (ULTRAM) 50 MG tablet TAKE 1 TABLET EVERY 8 HOURS AS NEEDED   FOR PAIN  90 tablet  5  . trimethoprim (TRIMPEX) 100 MG tablet Take 100 mg by mouth daily.        . valsartan (DIOVAN) 80 MG tablet Take 1 tablet (80 mg total) by mouth daily.  30 tablet  11  . vitamin E 400 UNIT capsule Take 400 Units by mouth daily.         No current facility-administered medications on file prior to visit.     Review of Systems Review of Systems  Constitutional: Negative for fever, appetite change, fatigue and unexpected weight change.  Eyes: Negative for pain and visual disturbance.  Respiratory: Negative for cough and shortness of breath.   Cardiovascular: Negative for cp or palpitations    Gastrointestinal: Negative for nausea, diarrhea and constipation.  Genitourinary: Negative for urgency and frequency. neg for dysuria/ flank pain or hematuria Skin: Negative for pallor or rash   Neurological: Negative for weakness, light-headedness, numbness and headaches.  Hematological: Negative for adenopathy. Does not bruise/bleed easily.  Psychiatric/Behavioral: Negative for dysphoric mood. The patient is not nervous/anxious. Neg for confusion or mental clouding        Objective:   Physical Exam  Constitutional: She appears well-developed and well-nourished. No distress.  HENT:  Head: Normocephalic and atraumatic.  Eyes: Conjunctivae and EOM are normal. Pupils are equal, round, and reactive to light. No scleral icterus.  Neck: Normal range of motion. Neck supple. No JVD present.  Cardiovascular: Normal rate and regular rhythm.   Pulmonary/Chest: Effort normal and breath sounds normal.   Abdominal: Soft. Bowel sounds are normal. She exhibits no distension  and no mass. There is no tenderness. There is no rebound and no guarding.  No suprapubic tenderness or fullness    Musculoskeletal: She exhibits no edema.  No cva tenderness   Lymphadenopathy:    She has no cervical adenopathy.  Neurological: She is alert.  Skin: Skin is warm and dry. No rash noted. No pallor.  Psychiatric: She has a normal mood and affect.          Assessment & Plan:

## 2013-02-11 NOTE — Assessment & Plan Note (Signed)
Clear ua after tx with cipro 250 On trimethoprim proph which this klebsiella infx was resistant to Copies to pt ucx today  F/u with urol as planned Re check cr to see if this is down (had gone up to 1.4) inst to inc water

## 2013-02-13 LAB — URINE CULTURE: Colony Count: 5000

## 2013-02-28 ENCOUNTER — Other Ambulatory Visit: Payer: Self-pay | Admitting: Family Medicine

## 2013-05-15 ENCOUNTER — Encounter: Payer: Self-pay | Admitting: Family Medicine

## 2013-05-15 ENCOUNTER — Ambulatory Visit (INDEPENDENT_AMBULATORY_CARE_PROVIDER_SITE_OTHER): Payer: Medicare Other | Admitting: Family Medicine

## 2013-05-15 VITALS — BP 158/70 | HR 68 | Temp 98.0°F | Wt 145.5 lb

## 2013-05-15 DIAGNOSIS — R109 Unspecified abdominal pain: Secondary | ICD-10-CM | POA: Insufficient documentation

## 2013-05-15 DIAGNOSIS — N390 Urinary tract infection, site not specified: Secondary | ICD-10-CM

## 2013-05-15 LAB — POCT URINALYSIS DIPSTICK
Bilirubin, UA: NEGATIVE
Clarity, UA: NEGATIVE
Nitrite, UA: NEGATIVE
Protein, UA: NEGATIVE
pH, UA: 6

## 2013-05-15 MED ORDER — CIPROFLOXACIN HCL 250 MG PO TABS: 250.0000 mg | ORAL_TABLET | Freq: Two times a day (BID) | ORAL | Status: DC

## 2013-05-15 NOTE — Assessment & Plan Note (Addendum)
Unclear etiology of current abd discomfort but reassuring that it has improved. In h/o chronic cystitis and suprapubic discomfort with R flank pain, I have sent off culture despite unremarkable urinalysis and have given her a script for cipro 7 day course to fill and start if worsening pain or symptoms recur. Pt agrees with plan. Await UCx for now.

## 2013-05-15 NOTE — Progress Notes (Signed)
  Subjective:    Patient ID: Ellen Peterson, female    DOB: 26-Apr-1920, 79 y.o.   MRN: [id number]  HPI CC: UTI?  Pleasant 79yo patient of Ellen Peterson presents today with concern for recurrent UTI.    H/o chronic cystitis treated with daily trimethoprim 100mg  has been seen by Dr. Isabel Caprice in past.  End of February last saw Dr. Isabel Caprice and trimethoprim was switched to keflex 250mg  qhs.  1d h/o lower abdominal and right back pain described as irritated pain.  Nauseated.  Incomplete emptying.  H/o chronic incontinence (both bowel and bladder). Last BM was Monday, but passing gas ok.   Tramadol helps pain. Some diarrhea on Sunday. Denies vomiting.no gross hematuria, fevers/chills, dysuria, urgency or frequency  Has home urine dips - thinks had positive white cells and nitrates. H/o diverticulitis s/p colon resection.  Drank plenty of water and today is feeling better.  H/o hospitalization for kidney infections in past.  Past Medical History  Diagnosis Date  . Diabetes mellitus     type II  . Diverticulosis of colon   . GERD (gastroesophageal reflux disease)   . Hyperlipidemia   . Hypertension   . Arthritis     OA  . Peripheral vascular disease   . PMR (polymyalgia rheumatica)   . Frequent UTI   . Chest tightness     catheterization, 2005, normal coronaries  . Wide-complex tachycardia     SVT in past  . Hypokalemia   . LBBB (left bundle branch block)     Since at least 2005  . Ejection fraction < 50%     EF 40% echo, 09/2009    Past Surgical History  Procedure Laterality Date  . Appendectomy  1993  . Cholecystectomy    . Colectomy  1993    partial  . Abdominal hysterectomy  1960s    fibroids total  . Vein ligation and stripping    . Bladder tack up    . Back surgery    . Esophagogastroduodenoscopy  05/2004  . Bilateral pyelonephritis      Review of Systems Per HPI    Objective:   Physical Exam  Nursing note and vitals reviewed. Constitutional: She appears  well-developed and well-nourished. No distress.  Abdominal: Soft. Normal appearance and bowel sounds are normal. She exhibits no distension and no mass. There is no hepatosplenomegaly. There is tenderness (mild-mod) in the suprapubic area. There is CVA tenderness (R flank). There is no rigidity, no rebound, no guarding and negative Murphy's sign.       Assessment & Plan:

## 2013-05-15 NOTE — Patient Instructions (Addendum)
Let's keep an eye on abdominal discomfort. If it's returning or worsening, please return or seek urgent care. I have sent off a urine culture and we will call you with results. Continue keflex nightly for now. Continue to drink plenty of water. Watch for fever >101 or return of abdominal pain or urinary symptoms - if this happens, start cipro 250mg  twice daily for 7 day course.

## 2013-05-17 LAB — URINE CULTURE: Organism ID, Bacteria: NO GROWTH

## 2013-07-24 ENCOUNTER — Other Ambulatory Visit: Payer: Self-pay | Admitting: *Deleted

## 2013-07-24 MED ORDER — ISOSORBIDE MONONITRATE ER 30 MG PO TB24: 30.0000 mg | ORAL_TABLET | Freq: Every day | ORAL | Status: DC

## 2013-07-29 ENCOUNTER — Other Ambulatory Visit: Payer: Medicare Other

## 2013-07-30 ENCOUNTER — Telehealth: Payer: Self-pay | Admitting: Family Medicine

## 2013-07-30 DIAGNOSIS — R7309 Other abnormal glucose: Secondary | ICD-10-CM

## 2013-07-30 DIAGNOSIS — I10 Essential (primary) hypertension: Secondary | ICD-10-CM

## 2013-07-30 DIAGNOSIS — E785 Hyperlipidemia, unspecified: Secondary | ICD-10-CM

## 2013-07-30 DIAGNOSIS — D649 Anemia, unspecified: Secondary | ICD-10-CM

## 2013-07-30 DIAGNOSIS — N259 Disorder resulting from impaired renal tubular function, unspecified: Secondary | ICD-10-CM

## 2013-07-30 DIAGNOSIS — E559 Vitamin D deficiency, unspecified: Secondary | ICD-10-CM

## 2013-07-30 NOTE — Telephone Encounter (Signed)
Message copied by Judy Pimple on Tue Jul 30, 2013  8:35 PM ------      Message from: Alvina Chou      Created: Wed Jul 24, 2013  2:43 PM      Regarding: lab orders for Wednesday, 8.13.14       Patient is scheduled for CPX labs, please order future labs, Thanks , Terri       ------

## 2013-07-31 ENCOUNTER — Other Ambulatory Visit (INDEPENDENT_AMBULATORY_CARE_PROVIDER_SITE_OTHER): Payer: Medicare Other

## 2013-07-31 DIAGNOSIS — D649 Anemia, unspecified: Secondary | ICD-10-CM

## 2013-07-31 DIAGNOSIS — R7309 Other abnormal glucose: Secondary | ICD-10-CM

## 2013-07-31 DIAGNOSIS — N259 Disorder resulting from impaired renal tubular function, unspecified: Secondary | ICD-10-CM

## 2013-07-31 DIAGNOSIS — I10 Essential (primary) hypertension: Secondary | ICD-10-CM

## 2013-07-31 DIAGNOSIS — E785 Hyperlipidemia, unspecified: Secondary | ICD-10-CM

## 2013-07-31 DIAGNOSIS — E559 Vitamin D deficiency, unspecified: Secondary | ICD-10-CM

## 2013-07-31 LAB — CBC WITH DIFFERENTIAL/PLATELET
Eosinophils Relative: 2.3 % (ref 0.0–5.0)
HCT: 35.7 % — ABNORMAL LOW (ref 36.0–46.0)
Lymphs Abs: 1.7 10*3/uL (ref 0.7–4.0)
Monocytes Relative: 12.4 % — ABNORMAL HIGH (ref 3.0–12.0)
Platelets: 277 10*3/uL (ref 150.0–400.0)
RBC: 3.89 Mil/uL (ref 3.87–5.11)
WBC: 6 10*3/uL (ref 4.5–10.5)

## 2013-07-31 LAB — HEMOGLOBIN A1C: Hgb A1c MFr Bld: 6.1 % (ref 4.6–6.5)

## 2013-08-01 LAB — COMPREHENSIVE METABOLIC PANEL
Albumin: 3.9 g/dL (ref 3.5–5.2)
CO2: 27 mEq/L (ref 19–32)
GFR: 48 mL/min — ABNORMAL LOW (ref >60.00)
Glucose, Bld: 94 mg/dL (ref 70–99)
Potassium: 4.1 mEq/L (ref 3.5–5.1)
Sodium: 139 mEq/L (ref 135–145)
Total Bilirubin: 0.7 mg/dL (ref 0.3–1.2)
Total Protein: 6.7 g/dL (ref 6.0–8.3)

## 2013-08-01 LAB — LDL CHOLESTEROL, DIRECT: Direct LDL: 149 mg/dL

## 2013-08-01 LAB — VITAMIN D 25 HYDROXY (VIT D DEFICIENCY, FRACTURES): Vit D, 25-Hydroxy: 46 ng/mL (ref 30–89)

## 2013-08-01 LAB — LIPID PANEL: VLDL: 23.4 mg/dL (ref 0.0–40.0)

## 2013-08-05 ENCOUNTER — Encounter: Payer: Medicare Other | Admitting: Family Medicine

## 2013-08-06 ENCOUNTER — Encounter: Payer: Self-pay | Admitting: Radiology

## 2013-08-07 ENCOUNTER — Encounter: Payer: Self-pay | Admitting: Family Medicine

## 2013-08-07 ENCOUNTER — Ambulatory Visit (INDEPENDENT_AMBULATORY_CARE_PROVIDER_SITE_OTHER): Payer: Medicare Other | Admitting: Family Medicine

## 2013-08-07 ENCOUNTER — Encounter: Payer: Self-pay | Admitting: *Deleted

## 2013-08-07 VITALS — BP 112/76 | HR 60 | Temp 98.1°F | Ht 62.0 in | Wt 144.5 lb

## 2013-08-07 DIAGNOSIS — N259 Disorder resulting from impaired renal tubular function, unspecified: Secondary | ICD-10-CM

## 2013-08-07 DIAGNOSIS — Z0000 Encounter for general adult medical examination without abnormal findings: Secondary | ICD-10-CM

## 2013-08-07 DIAGNOSIS — E785 Hyperlipidemia, unspecified: Secondary | ICD-10-CM

## 2013-08-07 DIAGNOSIS — D649 Anemia, unspecified: Secondary | ICD-10-CM

## 2013-08-07 DIAGNOSIS — E559 Vitamin D deficiency, unspecified: Secondary | ICD-10-CM

## 2013-08-07 DIAGNOSIS — I10 Essential (primary) hypertension: Secondary | ICD-10-CM

## 2013-08-07 DIAGNOSIS — R7309 Other abnormal glucose: Secondary | ICD-10-CM

## 2013-08-07 MED ORDER — TRAMADOL HCL 50 MG PO TABS: 50.0000 mg | ORAL_TABLET | Freq: Three times a day (TID) | ORAL | Status: DC | PRN

## 2013-08-07 MED ORDER — HYDROCHLOROTHIAZIDE 25 MG PO TABS: 25.0000 mg | ORAL_TABLET | Freq: Every day | ORAL | Status: DC

## 2013-08-07 MED ORDER — METOPROLOL TARTRATE 25 MG PO TABS: 25.0000 mg | ORAL_TABLET | Freq: Two times a day (BID) | ORAL | Status: DC

## 2013-08-07 NOTE — Progress Notes (Signed)
Subjective:    Patient ID: Ellen Peterson, female    DOB: 09-Jan-1920, 79 y.o.   MRN: [id number]  HPI I have personally reviewed the Medicare Annual Wellness questionnaire and have noted 1. The patient's medical and social history 2. Their use of alcohol, tobacco or illicit drugs 3. Their current medications and supplements 4. The patient's functional ability including ADL's, fall risks, home safety risks and hearing or visual             impairment. 5. Diet and physical activities 6. Evidence for depression or mood disorders  The patients weight, height, BMI have been recorded in the chart and visual acuity is per eye clinic.  I have made referrals, counseling and provided education to the patient based review of the above and I have provided the pt with a written personalized care plan for preventive services.  Lost her grandson to a chronic dz - in the past 2 days  Emotionally - she thinks she is doing ok - but more cp and arm pain in the past month  She has had this before - sees Dr Myrtis Ser (medically managed) Not sleeping well  Not interested in grief counseling at this time   Appetite  is not great   See scanned forms.  Routine anticipatory guidance given to patient.  See health maintenance. Flu 11/13 vaccine Shingles status -has had shingles but not the vaccine  PNA 1/01 up to date on imm  Tetanus Td was 94 vaccine  Colon screen - not wanting that  Breast cancer screening - declines further mammograms , and no lumps on self exam, declines imms also  Has had a hysterectomy Advance directive-has a living will/ scanned it into the chart today  Cognitive function addressed- see scanned forms- and if abnormal then additional documentation follows.   Falls- had one in early spring - got up out of the chair quickly -- without cane? - is better about that now  No injury  Mood- today is in grief   Hx of D def This check was 46-good  Hx of renal insuff   Chemistry       Component Value Date/Time   NA 139 07/31/2013 0852   K 4.1 07/31/2013 0852   CL 104 07/31/2013 0852   CO2 27 07/31/2013 0852   BUN 30* 07/31/2013 0852   CREATININE 1.1 07/31/2013 0852      Component Value Date/Time   CALCIUM 9.5 07/31/2013 0852   ALKPHOS 41 07/31/2013 0852   AST 26 07/31/2013 0852   ALT 19 07/31/2013 0852   BILITOT 0.7 07/31/2013 0852     Better- kidney numbers   hyperglcyemia Lab Results  Component Value Date   HGBA1C 6.1 07/31/2013     Lab Results  Component Value Date   CHOL 218* 07/31/2013   CHOL 234* 02/04/2013   CHOL 209* 03/25/2010   Lab Results  Component Value Date   HDL 60.20 07/31/2013   HDL 40.98 02/04/2013   HDL 11.91 03/25/2010   Lab Results  Component Value Date   LDLCALC  Value: 79        Total Cholesterol/HDL:CHD Risk Coronary Heart Disease Risk Table                     Men   Women  1/2 Average Risk   3.4   3.3  Average Risk       5.0   4.4  2 X Average Risk   9.6  7.1  3 X Average Risk  23.4   11.0        Use the calculated Patient Ratio above and the CHD Risk Table to determine the patient's CHD Risk.        ATP III CLASSIFICATION (LDL):  <100     mg/dL   Optimal  161-096  mg/dL   Near or Above                    Optimal  130-159  mg/dL   Borderline  045-409  mg/dL   High  >811     mg/dL   Very High 91/47/8295   LDLCALC 89 08/13/2009   Lab Results  Component Value Date   TRIG 117.0 07/31/2013   TRIG 121.0 02/04/2013   TRIG 107.0 03/25/2010   Lab Results  Component Value Date   CHOLHDL 4 07/31/2013   CHOLHDL 4 02/04/2013   CHOLHDL 3 03/25/2010   Lab Results  Component Value Date   LDLDIRECT 149.0 07/31/2013   LDLDIRECT 142.2 02/04/2013   LDLDIRECT 130.0 03/25/2010  ratio is about the same    PMH and SH reviewed  Meds, vitals, and allergies reviewed.   ROS: See HPI.  Otherwise negative.     Patient Active Problem List   Diagnosis Date Noted  . Abdominal  pain, other specified site 05/15/2013  . Mild anemia 02/11/2013  . LBBB (left bundle  branch block)   . Hyperlipidemia   . Hypertension   . Chest tightness   . Wide-complex tachycardia   . Ejection fraction < 50%   . Venous insufficiency 06/03/2011  . HYPERGLYCEMIA 11/17/2010  . UNSPECIFIED ANEMIA 02/08/2010  . RENAL INSUFFICIENCY 10/27/2009  . UTI'S, RECURRENT 09/22/2008  . UNSPECIFIED VITAMIN D DEFICIENCY 09/10/2008  . HERPES SIMPLEX INFECTION 07/03/2008  . COLONIC POLYPS, ADENOMATOUS 04/25/2007  . HYPERLIPIDEMIA 04/25/2007  . HYPERTENSION 04/25/2007  . PERIPHERAL VASCULAR DISEASE 04/25/2007  . GERD 04/25/2007  . DIVERTICULOSIS, COLON 04/25/2007  . IBS 04/25/2007  . OSTEOARTHRITIS 04/25/2007  . POLYMYALGIA RHEUMATICA 04/25/2007   Past Medical History  Diagnosis Date  . Diabetes mellitus     type II  . Diverticulosis of colon   . GERD (gastroesophageal reflux disease)   . Hyperlipidemia   . Hypertension   . Arthritis     OA  . Peripheral vascular disease   . PMR (polymyalgia rheumatica)   . Frequent UTI   . Chest tightness     catheterization, 2005, normal coronaries  . Wide-complex tachycardia     SVT in past  . Hypokalemia   . LBBB (left bundle branch block)     Since at least 2005  . Ejection fraction < 50%     EF 40% echo, 09/2009   Past Surgical History  Procedure Laterality Date  . Appendectomy  1993  . Cholecystectomy    . Colectomy  1993    partial  . Abdominal hysterectomy  1960s    fibroids total  . Vein ligation and stripping    . Bladder tack up    . Back surgery    . Esophagogastroduodenoscopy  05/2004  . Bilateral pyelonephritis     History  Substance Use Topics  . Smoking status: Never Smoker   . Smokeless tobacco: Never Used  . Alcohol Use: No   Family History  Problem Relation Age of Onset  . Heart disease Mother     CAD  . Hypertension Mother   . Stroke Mother   .  Cancer Sister     pancreatic CA  . Diabetes Brother    Allergies  Allergen Reactions  . Erythromycin     REACTION: Rash  . Metformin      REACTION: vomiting and diarrhea  . Metronidazole     REACTION: whelps  . Nitrofurantoin     REACTION: Reaction not known  . Nsaids     REACTION: GI symptoms  . Rofecoxib     REACTION: GI symptoms  . Simvastatin     REACTION: myalgia  . Sulfamethoxazole W-Trimethoprim     REACTION: itching  . Tetracycline     REACTION: Rash   Current Outpatient Prescriptions on File Prior to Visit  Medication Sig Dispense Refill  . acetaminophen (TYLENOL) 500 MG tablet Take 500 mg by mouth every 6 (six) hours as needed.        Marland Kitchen aspirin 81 MG tablet Take 81 mg by mouth daily.        . cephALEXin (KEFLEX) 250 MG capsule Take 250 mg by mouth at bedtime.      . cetirizine (ZYRTEC) 10 MG tablet Take 5 mg by mouth daily.        . cholecalciferol (VITAMIN D) 1000 UNITS tablet Take 1,000 Units by mouth daily.        Marland Kitchen CINNAMON PO Take 2 capsules by mouth daily. Each capsule is 1000mg .       . darifenacin (ENABLEX) 7.5 MG 24 hr tablet Take 7.5 mg by mouth daily.        . hydrochlorothiazide (HYDRODIURIL) 25 MG tablet TAKE ONE (1) TABLET BY MOUTH EACH DAY  30 tablet  5  . isosorbide mononitrate (IMDUR) 30 MG 24 hr tablet Take 1 tablet (30 mg total) by mouth daily.  30 tablet  11  . metoprolol tartrate (LOPRESSOR) 25 MG tablet TAKE ONE TABLET BY MOUTH TWICE DAILY  60 tablet  5  . Multiple Vitamins-Minerals (ICAPS) CAPS Take 1 capsule by mouth daily.       . nitroGLYCERIN (NITROSTAT) 0.4 MG SL tablet Place 0.4 mg under the tongue. As directed and as needed.       . Omega-3 Fatty Acids (FISH OIL) 1000 MG CAPS Take 1 capsule by mouth daily.        . pantoprazole (PROTONIX) 40 MG tablet Take 1 tablet (40 mg total) by mouth 2 (two) times daily.  60 tablet  11  . traMADol (ULTRAM) 50 MG tablet TAKE 1 TABLET BY MOUTH EVERY 8 HOURS AS NEEDED FOR PAIN  90 tablet  5  . valsartan (DIOVAN) 80 MG tablet Take 1 tablet (80 mg total) by mouth daily.  30 tablet  11  . vitamin E 400 UNIT capsule Take 400 Units by mouth daily.          No current facility-administered medications on file prior to visit.       Review of Systems Review of Systems  Constitutional: Negative for fever, appetite change, fatigue and unexpected weight change.  Eyes: Negative for pain and visual disturbance.  Respiratory: Negative for cough and shortness of breath.   Cardiovascular: Negative for cp or palpitations    Gastrointestinal: Negative for nausea, diarrhea and constipation.  Genitourinary: Negative for urgency and frequency.  Skin: Negative for pallor or rash   Neurological: Negative for weakness, light-headedness, numbness and headaches.  Hematological: Negative for adenopathy. Does not bruise/bleed easily.  Psychiatric/Behavioral: pos for symptoms of grief/ tearfulness (pt says she is doing ok)  Objective:   Physical Exam  Constitutional: She appears well-developed and well-nourished. No distress.  HENT:  Head: Normocephalic and atraumatic.  Right Ear: External ear normal.  Left Ear: External ear normal.  Nose: Nose normal.  Mouth/Throat: Oropharynx is clear and moist.  Eyes: Conjunctivae and EOM are normal. Pupils are equal, round, and reactive to light. Right eye exhibits no discharge. Left eye exhibits no discharge. No scleral icterus.  Neck: Normal range of motion. Neck supple. No JVD present. Carotid bruit is not present. No thyromegaly present.  Cardiovascular: Normal rate, regular rhythm, normal heart sounds and intact distal pulses.  Exam reveals no gallop.   Pulmonary/Chest: Effort normal and breath sounds normal. No respiratory distress. She has no wheezes. She has no rales. She exhibits no tenderness.  Abdominal: Soft. Bowel sounds are normal. She exhibits no distension and no mass. There is no tenderness.  Genitourinary:  Pt declines breast exam  Musculoskeletal: She exhibits no edema and no tenderness.  Lymphadenopathy:    She has no cervical adenopathy.  Neurological: She is alert. She has normal  reflexes. No cranial nerve deficit. She exhibits normal muscle tone. Coordination normal.  Skin: Skin is warm and dry. No rash noted. No erythema. No pallor.  Psychiatric: Her mood appears not anxious. She exhibits a depressed mood.  Not tearful today but seems generally sad          Assessment & Plan:

## 2013-08-07 NOTE — Patient Instructions (Addendum)
If you feel you need grief counseling please let me know  If you are interested in a shingles/zoster vaccine - call your insurance to check on coverage,( you should not get it within 1 month of other vaccines) , then call us for a prescription  for it to take to a pharmacy that gives the shot , or make a nurse visit to get it here depending on your coverage You are due for a tetanus shot-get that at the health dept.

## 2013-08-08 DIAGNOSIS — Z0000 Encounter for general adult medical examination without abnormal findings: Secondary | ICD-10-CM | POA: Insufficient documentation

## 2013-08-08 NOTE — Assessment & Plan Note (Signed)
Chronic/ unchanged - suspect chronic dz related  Pt does not want this aggressively worked up

## 2013-08-08 NOTE — Assessment & Plan Note (Signed)
D level is theraputic with current dose Disc imp to bone and overall health

## 2013-08-08 NOTE — Assessment & Plan Note (Signed)
Disc goals for lipids and reasons to control them Rev labs with pt Rev low sat fat diet in detail   

## 2013-08-08 NOTE — Assessment & Plan Note (Signed)
Lab Results  Component Value Date   HGBA1C 6.1 07/31/2013   Overall very well controlled  No hypoglycemia

## 2013-08-08 NOTE — Assessment & Plan Note (Signed)
Chemistry      Component Value Date/Time   NA 139 07/31/2013 0852   K 4.1 07/31/2013 0852   CL 104 07/31/2013 0852   CO2 27 07/31/2013 0852   BUN 30* 07/31/2013 0852   CREATININE 1.1 07/31/2013 0852      Component Value Date/Time   CALCIUM 9.5 07/31/2013 0852   ALKPHOS 41 07/31/2013 0852   AST 26 07/31/2013 0852   ALT 19 07/31/2013 0852   BILITOT 0.7 07/31/2013 0852     Better numbers with current meds Disc imp of fluid intake

## 2013-08-08 NOTE — Assessment & Plan Note (Signed)
Pt declines aggressive w/u  Suspect of chronic dz

## 2013-08-08 NOTE — Assessment & Plan Note (Signed)
Reviewed health habits including diet and exercise and skin cancer prevention Also reviewed health mt list, fam hx and immunizations  Pt declines most health mt at her age  See HPI

## 2013-08-08 NOTE — Assessment & Plan Note (Signed)
BP: 112/76 mmHg   bp in fair control at this time  No changes needed  Disc lifstyle change with low sodium diet and exercise

## 2013-09-18 ENCOUNTER — Ambulatory Visit: Payer: Medicare Other

## 2013-09-19 ENCOUNTER — Ambulatory Visit (INDEPENDENT_AMBULATORY_CARE_PROVIDER_SITE_OTHER): Payer: Medicare Other

## 2013-09-19 DIAGNOSIS — Z23 Encounter for immunization: Secondary | ICD-10-CM

## 2014-02-28 ENCOUNTER — Other Ambulatory Visit: Payer: Self-pay | Admitting: Family Medicine

## 2014-03-03 NOTE — Telephone Encounter (Signed)
Please refill for 6 mo 

## 2014-03-03 NOTE — Telephone Encounter (Signed)
Electronic refill request, no recent/future appt., please advise  

## 2014-03-03 NOTE — Telephone Encounter (Signed)
done

## 2014-03-04 ENCOUNTER — Other Ambulatory Visit: Payer: Self-pay | Admitting: Family Medicine

## 2014-03-04 NOTE — Telephone Encounter (Signed)
Please schedule PE after 08/07/14  Px written for call in

## 2014-03-05 NOTE — Telephone Encounter (Signed)
appt scheduled an Rx called in as prescribed

## 2014-05-30 ENCOUNTER — Encounter: Payer: Self-pay | Admitting: Cardiology

## 2014-05-30 ENCOUNTER — Ambulatory Visit (INDEPENDENT_AMBULATORY_CARE_PROVIDER_SITE_OTHER): Payer: Medicare Other | Admitting: Cardiology

## 2014-05-30 VITALS — BP 140/72 | HR 86 | Ht 62.0 in | Wt 144.8 lb

## 2014-05-30 DIAGNOSIS — R0789 Other chest pain: Secondary | ICD-10-CM

## 2014-05-30 DIAGNOSIS — I447 Left bundle-branch block, unspecified: Secondary | ICD-10-CM

## 2014-05-30 DIAGNOSIS — I10 Essential (primary) hypertension: Secondary | ICD-10-CM

## 2014-05-30 MED ORDER — VALSARTAN 80 MG PO TABS: 80.0000 mg | ORAL_TABLET | Freq: Every day | ORAL | Status: DC

## 2014-05-30 MED ORDER — METOPROLOL TARTRATE 25 MG PO TABS: 25.0000 mg | ORAL_TABLET | Freq: Two times a day (BID) | ORAL | Status: DC

## 2014-05-30 MED ORDER — NITROGLYCERIN 0.4 MG SL SUBL: 0.4000 mg | SUBLINGUAL_TABLET | SUBLINGUAL | Status: DC | PRN

## 2014-05-30 MED ORDER — ISOSORBIDE MONONITRATE ER 30 MG PO TB24: 30.0000 mg | ORAL_TABLET | Freq: Every day | ORAL | Status: DC

## 2014-05-30 MED ORDER — HYDROCHLOROTHIAZIDE 25 MG PO TABS: 25.0000 mg | ORAL_TABLET | Freq: Every day | ORAL | Status: DC

## 2014-05-30 NOTE — Progress Notes (Signed)
Patient ID: Ellen Peterson, female   DOB: 1920-03-09, 80 y.o.   MRN: [id number]    HPI  Patient is seen today to followup for cardiac status. I saw her last 2 years ago. She is stable. She walks with a cane and walker. She is here with one of her daughters today. In the past she has had what she calls a tugging sensation in her chest. She continues to have this but there is been no definite change. Cardiac catheterization had been done in 2005 with no significant coronary disease. There is an old left bundle branch block. Historically her ejection fraction has been in the range of 40%. Her main goal in life is to continue to take care of the dog of her late husband.  Have not seen the patient in 2 years. As part of today's evaluation I have reviewed old records and I have updated the current electronic medical record.  Allergies  Allergen Reactions  . Erythromycin     REACTION: Rash  . Metformin     REACTION: vomiting and diarrhea  . Metronidazole     REACTION: whelps  . Nitrofurantoin     REACTION: Reaction not known  . Nsaids     REACTION: GI symptoms  . Rofecoxib     REACTION: GI symptoms  . Simvastatin     REACTION: myalgia  . Sulfamethoxazole-Trimethoprim     REACTION: itching  . Tetracycline     REACTION: Rash    Current Outpatient Prescriptions  Medication Sig Dispense Refill  . acetaminophen (TYLENOL) 500 MG tablet Take 500 mg by mouth every 6 (six) hours as needed.        Marland Kitchen aspirin 81 MG tablet Take 81 mg by mouth daily.        . cephALEXin (KEFLEX) 250 MG capsule Take 250 mg by mouth at bedtime.      . cetirizine (ZYRTEC) 10 MG tablet Take 5 mg by mouth daily.        . cholecalciferol (VITAMIN D) 1000 UNITS tablet Take 1,000 Units by mouth daily.        Marland Kitchen CINNAMON PO Take 2 capsules by mouth daily. Each capsule is 1000mg .       . darifenacin (ENABLEX) 7.5 MG 24 hr tablet Take 7.5 mg by mouth daily.        . diclofenac sodium (VOLTAREN) 1 % GEL Apply topically as needed.       . hydrochlorothiazide (HYDRODIURIL) 25 MG tablet Take 1 tablet (25 mg total) by mouth daily.  30 tablet  11  . isosorbide mononitrate (IMDUR) 30 MG 24 hr tablet Take 1 tablet (30 mg total) by mouth daily.  30 tablet  11  . metoprolol tartrate (LOPRESSOR) 25 MG tablet Take 1 tablet (25 mg total) by mouth 2 (two) times daily.  60 tablet  11  . Multiple Vitamins-Minerals (ICAPS) CAPS Take 1 capsule by mouth daily.       . nitroGLYCERIN (NITROSTAT) 0.4 MG SL tablet Place 0.4 mg under the tongue. As directed and as needed.       . Omega-3 Fatty Acids (FISH OIL) 1000 MG CAPS Take 1 capsule by mouth daily.        . pantoprazole (PROTONIX) 40 MG tablet TAKE ONE (1) TABLET BY MOUTH TWO (2) TIMES DAILY  60 tablet  5  . traMADol (ULTRAM) 50 MG tablet TAKE ONE TABLET EVERY 8 HOURS AS NEEDED  90 tablet  5  . valsartan (DIOVAN) 80 MG  tablet TAKE ONE (1) TABLET BY MOUTH EVERY DAY  30 tablet  5  . vitamin E 400 UNIT capsule Take 400 Units by mouth daily.         No current facility-administered medications for this visit.    History   Social History  . Marital Status: Married    Spouse Name: N/A    Number of Children: N/A  . Years of Education: N/A   Occupational History  . Not on file.   Social History Main Topics  . Smoking status: Never Smoker   . Smokeless tobacco: Never Used  . Alcohol Use: No  . Drug Use: No  . Sexual Activity: Not on file   Other Topics Concern  . Not on file   Social History Narrative  . No narrative on file    Family History  Problem Relation Age of Onset  . Heart disease Mother     CAD  . Hypertension Mother   . Stroke Mother   . Cancer Sister     pancreatic CA  . Diabetes Brother     Past Medical History  Diagnosis Date  . Diabetes mellitus     type II  . Diverticulosis of colon   . GERD (gastroesophageal reflux disease)   . Hyperlipidemia   . Hypertension   . Arthritis     OA  . Peripheral vascular disease   . PMR (polymyalgia  rheumatica)   . Frequent UTI   . Chest tightness     catheterization, 2005, normal coronaries  . Wide-complex tachycardia     SVT in past  . Hypokalemia   . LBBB (left bundle branch block)     Since at least 2005  . Ejection fraction < 50%     EF 40% echo, 09/2009    Past Surgical History  Procedure Laterality Date  . Appendectomy  1993  . Cholecystectomy    . Colectomy  1993    partial  . Abdominal hysterectomy  1960s    fibroids total  . Vein ligation and stripping    . Bladder tack up    . Back surgery    . Esophagogastroduodenoscopy  05/2004  . Bilateral pyelonephritis      Patient Active Problem List   Diagnosis Date Noted  . Encounter for Medicare annual wellness exam 08/08/2013  . Abdominal  pain, other specified site 05/15/2013  . Mild anemia 02/11/2013  . LBBB (left bundle branch block)   . Hyperlipidemia   . Hypertension   . Chest tightness   . Wide-complex tachycardia   . Ejection fraction < 50%   . Venous insufficiency 06/03/2011  . HYPERGLYCEMIA 11/17/2010  . UNSPECIFIED ANEMIA 02/08/2010  . RENAL INSUFFICIENCY 10/27/2009  . UTI'S, RECURRENT 09/22/2008  . UNSPECIFIED VITAMIN D DEFICIENCY 09/10/2008  . HERPES SIMPLEX INFECTION 07/03/2008  . COLONIC POLYPS, ADENOMATOUS 04/25/2007  . HYPERLIPIDEMIA 04/25/2007  . HYPERTENSION 04/25/2007  . PERIPHERAL VASCULAR DISEASE 04/25/2007  . GERD 04/25/2007  . DIVERTICULOSIS, COLON 04/25/2007  . IBS 04/25/2007  . OSTEOARTHRITIS 04/25/2007  . POLYMYALGIA RHEUMATICA 04/25/2007    ROS   Patient denies fever, chills, headache, sweats, rash, change in vision, change in hearing, cough, nausea or vomiting, urinary symptoms. All other systems are reviewed and are negative.  PHYSICAL EXAM  Patient is oriented to person time and place. Affect is normal. She is here with one of her daughters. Head is atraumatic. Sclera and conjunctiva are normal. There is no jugulovenous distention. Lungs are clear. Respiratory  effort  is nonlabored. Cardiac exam reveals S1 and S2. Her abdomen is soft. There is no peripheral edema. There are no musculoskeletal deformities. There are no skin rashes.  Filed Vitals:   05/30/14 1128  BP: 140/72  Pulse: 86  Height: 5\' 2"  (1.575 m)  Weight: 144 lb 12.8 oz (65.681 kg)   EKG is done today and reviewed by me. She has old left bundle branch block. There is no significant change.  ASSESSMENT & PLAN

## 2014-05-30 NOTE — Assessment & Plan Note (Signed)
There is old left bundle branch block. No change in therapy.

## 2014-05-30 NOTE — Assessment & Plan Note (Signed)
The patient has chronic mild chest tightness. There has been no change over time. Catheterization in 2005 revealed normal coronaries. No further workup is needed. The patient is 80 years of age. We can continue to give her medicines without frequent visits.

## 2014-05-30 NOTE — Assessment & Plan Note (Signed)
Blood pressures controlled. No change in therapy. 

## 2014-05-30 NOTE — Patient Instructions (Signed)
**Note De-identified  Obfuscation** Your physician recommends that you continue on your current medications as directed. Please refer to the Current Medication list given to you today.  Your physician wants you to follow-up in: 2 years  You will receive a reminder letter in the mail two months in advance. If you don't receive a letter, please call our office to schedule the follow-up appointment.  

## 2014-06-04 ENCOUNTER — Ambulatory Visit: Payer: Medicare Other | Admitting: Cardiology

## 2014-06-11 ENCOUNTER — Inpatient Hospital Stay (HOSPITAL_COMMUNITY)
Admission: EM | Admit: 2014-06-11 | Discharge: 2014-06-14 | DRG: 281 | Disposition: A | Discharge status: Home Health | Payer: Medicare Other | Attending: Cardiovascular Disease | Admitting: Cardiovascular Disease

## 2014-06-11 ENCOUNTER — Encounter (HOSPITAL_COMMUNITY): Payer: Self-pay | Admitting: Emergency Medicine

## 2014-06-11 ENCOUNTER — Telehealth: Payer: Self-pay | Admitting: Family Medicine

## 2014-06-11 ENCOUNTER — Emergency Department (HOSPITAL_COMMUNITY): Payer: Medicare Other

## 2014-06-11 DIAGNOSIS — R079 Chest pain, unspecified: Secondary | ICD-10-CM | POA: Diagnosis present

## 2014-06-11 DIAGNOSIS — I2510 Atherosclerotic heart disease of native coronary artery without angina pectoris: Secondary | ICD-10-CM | POA: Diagnosis present

## 2014-06-11 DIAGNOSIS — E785 Hyperlipidemia, unspecified: Secondary | ICD-10-CM

## 2014-06-11 DIAGNOSIS — E119 Type 2 diabetes mellitus without complications: Secondary | ICD-10-CM | POA: Diagnosis present

## 2014-06-11 DIAGNOSIS — R739 Hyperglycemia, unspecified: Secondary | ICD-10-CM | POA: Diagnosis present

## 2014-06-11 DIAGNOSIS — N259 Disorder resulting from impaired renal tubular function, unspecified: Secondary | ICD-10-CM

## 2014-06-11 DIAGNOSIS — R0789 Other chest pain: Secondary | ICD-10-CM

## 2014-06-11 DIAGNOSIS — Z6826 Body mass index (BMI) 26.0-26.9, adult: Secondary | ICD-10-CM | POA: Diagnosis not present

## 2014-06-11 DIAGNOSIS — B009 Herpesviral infection, unspecified: Secondary | ICD-10-CM

## 2014-06-11 DIAGNOSIS — I739 Peripheral vascular disease, unspecified: Secondary | ICD-10-CM | POA: Diagnosis present

## 2014-06-11 DIAGNOSIS — Z882 Allergy status to sulfonamides status: Secondary | ICD-10-CM

## 2014-06-11 DIAGNOSIS — M1990 Unspecified osteoarthritis, unspecified site: Secondary | ICD-10-CM

## 2014-06-11 DIAGNOSIS — Z0000 Encounter for general adult medical examination without abnormal findings: Secondary | ICD-10-CM

## 2014-06-11 DIAGNOSIS — I447 Left bundle-branch block, unspecified: Secondary | ICD-10-CM | POA: Diagnosis present

## 2014-06-11 DIAGNOSIS — Z881 Allergy status to other antibiotic agents status: Secondary | ICD-10-CM

## 2014-06-11 DIAGNOSIS — I253 Aneurysm of heart: Secondary | ICD-10-CM | POA: Diagnosis present

## 2014-06-11 DIAGNOSIS — T502X5A Adverse effect of carbonic-anhydrase inhibitors, benzothiadiazides and other diuretics, initial encounter: Secondary | ICD-10-CM | POA: Diagnosis present

## 2014-06-11 DIAGNOSIS — N289 Disorder of kidney and ureter, unspecified: Secondary | ICD-10-CM | POA: Diagnosis present

## 2014-06-11 DIAGNOSIS — M353 Polymyalgia rheumatica: Secondary | ICD-10-CM | POA: Diagnosis present

## 2014-06-11 DIAGNOSIS — I10 Essential (primary) hypertension: Secondary | ICD-10-CM

## 2014-06-11 DIAGNOSIS — I2789 Other specified pulmonary heart diseases: Secondary | ICD-10-CM | POA: Diagnosis present

## 2014-06-11 DIAGNOSIS — R109 Unspecified abdominal pain: Secondary | ICD-10-CM

## 2014-06-11 DIAGNOSIS — D649 Anemia, unspecified: Secondary | ICD-10-CM

## 2014-06-11 DIAGNOSIS — K219 Gastro-esophageal reflux disease without esophagitis: Secondary | ICD-10-CM | POA: Diagnosis present

## 2014-06-11 DIAGNOSIS — Z7982 Long term (current) use of aspirin: Secondary | ICD-10-CM | POA: Diagnosis not present

## 2014-06-11 DIAGNOSIS — Z800 Family history of malignant neoplasm of digestive organs: Secondary | ICD-10-CM

## 2014-06-11 DIAGNOSIS — Z888 Allergy status to other drugs, medicaments and biological substances status: Secondary | ICD-10-CM

## 2014-06-11 DIAGNOSIS — I200 Unstable angina: Secondary | ICD-10-CM

## 2014-06-11 DIAGNOSIS — D126 Benign neoplasm of colon, unspecified: Secondary | ICD-10-CM

## 2014-06-11 DIAGNOSIS — I214 Non-ST elevation (NSTEMI) myocardial infarction: Secondary | ICD-10-CM | POA: Diagnosis present

## 2014-06-11 DIAGNOSIS — N390 Urinary tract infection, site not specified: Secondary | ICD-10-CM

## 2014-06-11 DIAGNOSIS — R7309 Other abnormal glucose: Secondary | ICD-10-CM

## 2014-06-11 DIAGNOSIS — R072 Precordial pain: Secondary | ICD-10-CM

## 2014-06-11 DIAGNOSIS — Z833 Family history of diabetes mellitus: Secondary | ICD-10-CM

## 2014-06-11 DIAGNOSIS — E559 Vitamin D deficiency, unspecified: Secondary | ICD-10-CM

## 2014-06-11 DIAGNOSIS — T50995A Adverse effect of other drugs, medicaments and biological substances, initial encounter: Secondary | ICD-10-CM | POA: Diagnosis present

## 2014-06-11 DIAGNOSIS — Z823 Family history of stroke: Secondary | ICD-10-CM

## 2014-06-11 DIAGNOSIS — I509 Heart failure, unspecified: Secondary | ICD-10-CM

## 2014-06-11 DIAGNOSIS — Z8249 Family history of ischemic heart disease and other diseases of the circulatory system: Secondary | ICD-10-CM

## 2014-06-11 DIAGNOSIS — IMO0002 Reserved for concepts with insufficient information to code with codable children: Secondary | ICD-10-CM

## 2014-06-11 DIAGNOSIS — R000 Tachycardia, unspecified: Secondary | ICD-10-CM

## 2014-06-11 DIAGNOSIS — I872 Venous insufficiency (chronic) (peripheral): Secondary | ICD-10-CM

## 2014-06-11 DIAGNOSIS — I5023 Acute on chronic systolic (congestive) heart failure: Secondary | ICD-10-CM

## 2014-06-11 DIAGNOSIS — R9430 Abnormal result of cardiovascular function study, unspecified: Secondary | ICD-10-CM

## 2014-06-11 DIAGNOSIS — I272 Pulmonary hypertension, unspecified: Secondary | ICD-10-CM

## 2014-06-11 DIAGNOSIS — I472 Ventricular tachycardia: Secondary | ICD-10-CM

## 2014-06-11 DIAGNOSIS — K589 Irritable bowel syndrome without diarrhea: Secondary | ICD-10-CM

## 2014-06-11 DIAGNOSIS — K5730 Diverticulosis of large intestine without perforation or abscess without bleeding: Secondary | ICD-10-CM

## 2014-06-11 LAB — CBC WITH DIFFERENTIAL/PLATELET
BASOS PCT: 0 % (ref 0–1)
Basophils Absolute: 0 10*3/uL (ref 0.0–0.1)
EOS ABS: 0.1 10*3/uL (ref 0.0–0.7)
Eosinophils Relative: 1 % (ref 0–5)
HCT: 39.9 % (ref 36.0–46.0)
Hemoglobin: 13.4 g/dL (ref 12.0–15.0)
Lymphocytes Relative: 14 % (ref 12–46)
Lymphs Abs: 1.3 10*3/uL (ref 0.7–4.0)
MCH: 30.7 pg (ref 26.0–34.0)
MCHC: 33.6 g/dL (ref 30.0–36.0)
MCV: 91.5 fL (ref 78.0–100.0)
Monocytes Absolute: 0.7 10*3/uL (ref 0.1–1.0)
Monocytes Relative: 7 % (ref 3–12)
NEUTROS ABS: 7.5 10*3/uL (ref 1.7–7.7)
NEUTROS PCT: 78 % — AB (ref 43–77)
Platelets: 260 10*3/uL (ref 150–400)
RBC: 4.36 MIL/uL (ref 3.87–5.11)
RDW: 14.1 % (ref 11.5–15.5)
WBC: 9.6 10*3/uL (ref 4.0–10.5)

## 2014-06-11 LAB — TROPONIN I: TROPONIN I: 0.56 ng/mL — AB (ref <0.30)

## 2014-06-11 LAB — BASIC METABOLIC PANEL
BUN: 33 mg/dL — ABNORMAL HIGH (ref 6–23)
CALCIUM: 9.9 mg/dL (ref 8.4–10.5)
CO2: 22 mEq/L (ref 19–32)
CREATININE: 0.87 mg/dL (ref 0.50–1.10)
Chloride: 105 mEq/L (ref 96–112)
GFR, EST AFRICAN AMERICAN: 65 mL/min — AB (ref >90)
GFR, EST NON AFRICAN AMERICAN: 57 mL/min — AB (ref >90)
GLUCOSE: 132 mg/dL — AB (ref 70–99)
Potassium: 4.2 mEq/L (ref 3.7–5.3)
Sodium: 144 mEq/L (ref 137–147)

## 2014-06-11 LAB — I-STAT TROPONIN, ED: TROPONIN I, POC: 0.35 ng/mL — AB (ref 0.00–0.08)

## 2014-06-11 LAB — CK TOTAL AND CKMB (NOT AT ARMC)
CK, MB: 4.6 ng/mL — ABNORMAL HIGH (ref 0.3–4.0)
RELATIVE INDEX: INVALID (ref 0.0–2.5)
Total CK: 57 U/L (ref 7–177)

## 2014-06-11 LAB — MRSA PCR SCREENING: MRSA by PCR: NEGATIVE

## 2014-06-11 LAB — HEPARIN LEVEL (UNFRACTIONATED): HEPARIN UNFRACTIONATED: 0.33 [IU]/mL (ref 0.30–0.70)

## 2014-06-11 LAB — PRO B NATRIURETIC PEPTIDE: Pro B Natriuretic peptide (BNP): 7029 pg/mL — ABNORMAL HIGH (ref 0–450)

## 2014-06-11 MED ORDER — SODIUM CHLORIDE 0.9 % IV SOLN
INTRAVENOUS | Status: DC
  Administered 2014-06-11: 10 mL/h via INTRAVENOUS

## 2014-06-11 MED ORDER — ACETAMINOPHEN 500 MG PO TABS: 500.0000 mg | ORAL_TABLET | Freq: Four times a day (QID) | ORAL | Status: DC | PRN

## 2014-06-11 MED ORDER — PANTOPRAZOLE SODIUM 40 MG PO TBEC
40.0000 mg | DELAYED_RELEASE_TABLET | Freq: Two times a day (BID) | ORAL | Status: DC
  Administered 2014-06-11 – 2014-06-14 (×6): 40 mg via ORAL
  Filled 2014-06-11 (×6): qty 1

## 2014-06-11 MED ORDER — ASPIRIN EC 81 MG PO TBEC
81.0000 mg | DELAYED_RELEASE_TABLET | Freq: Every day | ORAL | Status: DC
  Administered 2014-06-11 – 2014-06-14 (×4): 81 mg via ORAL
  Filled 2014-06-11 (×4): qty 1

## 2014-06-11 MED ORDER — METOPROLOL TARTRATE 25 MG PO TABS
25.0000 mg | ORAL_TABLET | Freq: Two times a day (BID) | ORAL | Status: DC
  Administered 2014-06-11 – 2014-06-14 (×6): 25 mg via ORAL
  Filled 2014-06-11 (×7): qty 1

## 2014-06-11 MED ORDER — SODIUM CHLORIDE 0.9 % IJ SOLN
3.0000 mL | Freq: Two times a day (BID) | INTRAMUSCULAR | Status: DC
  Administered 2014-06-11 – 2014-06-12 (×2): 3 mL via INTRAVENOUS

## 2014-06-11 MED ORDER — SODIUM CHLORIDE 0.9 % IV SOLN: 250.0000 mL | INTRAVENOUS | Status: DC | PRN

## 2014-06-11 MED ORDER — BIOTENE DRY MOUTH MT LIQD
15.0000 mL | Freq: Two times a day (BID) | OROMUCOSAL | Status: DC
  Administered 2014-06-12 – 2014-06-14 (×5): 15 mL via OROMUCOSAL

## 2014-06-11 MED ORDER — ASPIRIN 81 MG PO CHEW
81.0000 mg | CHEWABLE_TABLET | ORAL | Status: AC
  Administered 2014-06-12: 81 mg via ORAL
  Filled 2014-06-11: qty 1

## 2014-06-11 MED ORDER — SODIUM CHLORIDE 0.9 % IV SOLN: INTRAVENOUS | Status: DC

## 2014-06-11 MED ORDER — CEPHALEXIN 250 MG PO CAPS
250.0000 mg | ORAL_CAPSULE | Freq: Every day | ORAL | Status: DC
  Administered 2014-06-11 – 2014-06-13 (×3): 250 mg via ORAL
  Filled 2014-06-11 (×4): qty 1

## 2014-06-11 MED ORDER — HEPARIN BOLUS VIA INFUSION
3000.0000 [IU] | Freq: Once | INTRAVENOUS | Status: AC
  Administered 2014-06-11: 3000 [IU] via INTRAVENOUS
  Filled 2014-06-11: qty 3000

## 2014-06-11 MED ORDER — FUROSEMIDE 10 MG/ML IJ SOLN
40.0000 mg | Freq: Once | INTRAMUSCULAR | Status: AC
Start: 2014-06-11 — End: 2014-06-11
  Administered 2014-06-11: 40 mg via INTRAVENOUS
  Filled 2014-06-11: qty 4

## 2014-06-11 MED ORDER — IRBESARTAN 75 MG PO TABS
75.0000 mg | ORAL_TABLET | Freq: Every day | ORAL | Status: DC
  Administered 2014-06-11 – 2014-06-12 (×2): 75 mg via ORAL
  Filled 2014-06-11 (×2): qty 1

## 2014-06-11 MED ORDER — VITAMIN D3 25 MCG (1000 UNIT) PO TABS
1000.0000 [IU] | ORAL_TABLET | Freq: Every day | ORAL | Status: DC
  Administered 2014-06-12 – 2014-06-14 (×3): 1000 [IU] via ORAL
  Filled 2014-06-11 (×3): qty 1

## 2014-06-11 MED ORDER — HEPARIN (PORCINE) IN NACL 100-0.45 UNIT/ML-% IJ SOLN
800.0000 [IU]/h | INTRAMUSCULAR | Status: DC
  Administered 2014-06-11 (×2): 700 [IU]/h via INTRAVENOUS
  Filled 2014-06-11 (×2): qty 250

## 2014-06-11 MED ORDER — SODIUM CHLORIDE 0.9 % IJ SOLN: 3.0000 mL | INTRAMUSCULAR | Status: DC | PRN

## 2014-06-11 MED ORDER — ACETAMINOPHEN 325 MG PO TABS: 650.0000 mg | ORAL_TABLET | ORAL | Status: DC | PRN

## 2014-06-11 MED ORDER — NITROGLYCERIN IN D5W 200-5 MCG/ML-% IV SOLN
2.0000 ug/min | INTRAVENOUS | Status: DC
  Administered 2014-06-11: 10 ug/min via INTRAVENOUS
  Filled 2014-06-11: qty 250

## 2014-06-11 MED ORDER — ONDANSETRON HCL 4 MG/2ML IJ SOLN
4.0000 mg | Freq: Four times a day (QID) | INTRAMUSCULAR | Status: DC | PRN
Start: 2014-06-11 — End: 2014-06-14
  Administered 2014-06-11: 4 mg via INTRAVENOUS
  Filled 2014-06-11: qty 2

## 2014-06-11 MED ORDER — NITROGLYCERIN 0.4 MG SL SUBL: 0.4000 mg | SUBLINGUAL_TABLET | SUBLINGUAL | Status: DC | PRN

## 2014-06-11 NOTE — ED Notes (Signed)
PT's sats occasionally drop to 94%. Placed on 2 L .

## 2014-06-11 NOTE — ED Provider Notes (Signed)
CSN: [id number]     Arrival date & time 06/11/14  1660 History   First MD Initiated Contact with Patient 06/11/14 1006     Chief Complaint  Patient presents with  . Chest Pain  . Shortness of Breath       HPI Pt was seen at 1015. Per pt and her family, c/o gradual onset and worsening of multiple intermittent episodes of chest "pain" SOB for the past 2 weeks, worse over the past 2 days. Pt describes the CP as "tightness," worsens with exertion. Pt states she had a episode of CP yesterday "that lasted for more 10 minutes."  Pt denied any CP for EMS on their arrival to scene and pt continues to deny CP on arrival to the ED. EMS gave ASA en route. Pt's family states pt "hasn't been able to walk as far as she usually can" due to increasing SOB. Denies palpitations, no cough, no abd pain, no N/V/D, no back pain.     Past Medical History  Diagnosis Date  . Diabetes mellitus     type II  . Diverticulosis of colon   . GERD (gastroesophageal reflux disease)   . Hyperlipidemia   . Hypertension   . Arthritis     OA  . Peripheral vascular disease   . PMR (polymyalgia rheumatica)   . Frequent UTI   . Chest tightness     catheterization, 2005, normal coronaries  . Wide-complex tachycardia     SVT in past  . Hypokalemia   . LBBB (left bundle branch block)     Since at least 2005  . Ejection fraction < 50%     EF 40% echo, 09/2009   Past Surgical History  Procedure Laterality Date  . Appendectomy  1993  . Cholecystectomy    . Colectomy  1993    partial  . Abdominal hysterectomy  1960s    fibroids total  . Vein ligation and stripping    . Bladder tack up    . Back surgery    . Esophagogastroduodenoscopy  05/2004  . Bilateral pyelonephritis    . Cardiac catheterization  2005    no significant CAD   Family History  Problem Relation Age of Onset  . Heart disease Mother     CAD  . Hypertension Mother   . Stroke Mother   . Cancer Sister     pancreatic CA  . Diabetes Brother     History  Substance Use Topics  . Smoking status: Never Smoker   . Smokeless tobacco: Never Used  . Alcohol Use: No    Review of Systems ROS: Statement: All systems negative except as marked or noted in the HPI; Constitutional: Negative for fever and chills. ; ; Eyes: Negative for eye pain, redness and discharge. ; ; ENMT: Negative for ear pain, hoarseness, nasal congestion, sinus pressure and sore throat. ; ; Cardiovascular: +CP, SOB, DOE. Negative for palpitations, diaphoresis, and peripheral edema. ; ; Respiratory: Negative for cough, wheezing and stridor. ; ; Gastrointestinal: Negative for nausea, vomiting, diarrhea, abdominal pain, blood in stool, hematemesis, jaundice and rectal bleeding. . ; ; Genitourinary: Negative for dysuria, flank pain and hematuria. ; ; Musculoskeletal: Negative for back pain and neck pain. Negative for swelling and trauma.; ; Skin: Negative for pruritus, rash, abrasions, blisters, bruising and skin lesion.; ; Neuro: Negative for headache, lightheadedness and neck stiffness. Negative for weakness, altered level of consciousness , altered mental status, extremity weakness, paresthesias, involuntary movement, seizure and syncope.  Allergies  Erythromycin; Metformin; Metronidazole; Nitrofurantoin; Nsaids; Rofecoxib; Simvastatin; Sulfamethoxazole-trimethoprim; and Tetracycline  Home Medications   Prior to Admission medications   Medication Sig Start Date End Date Taking? Authorizing Provider  acetaminophen (TYLENOL) 500 MG tablet Take 500 mg by mouth every 6 (six) hours as needed (pain).    Yes Historical Provider, MD  aspirin 81 MG tablet Take 81 mg by mouth daily.     Yes Historical Provider, MD  cephALEXin (KEFLEX) 250 MG capsule Take 250 mg by mouth at bedtime. Continuous UTI prevention   Yes Historical Provider, MD  cetirizine (ZYRTEC) 10 MG tablet Take 5 mg by mouth daily.     Yes Historical Provider, MD  cholecalciferol (VITAMIN D) 1000 UNITS tablet  Take 1,000 Units by mouth daily.     Yes Historical Provider, MD  CINNAMON PO Take 2,000 mg by mouth daily.    Yes Historical Provider, MD  darifenacin (ENABLEX) 7.5 MG 24 hr tablet Take 7.5 mg by mouth daily.     Yes Historical Provider, MD  diclofenac sodium (VOLTAREN) 1 % GEL Apply 2 g topically 2 (two) times daily as needed (knee pain).    Yes Historical Provider, MD  hydrochlorothiazide (HYDRODIURIL) 25 MG tablet Take 1 tablet (25 mg total) by mouth daily. 05/30/14  Yes Carlena Bjornstad, MD  isosorbide mononitrate (IMDUR) 30 MG 24 hr tablet Take 30 mg by mouth at bedtime.   Yes Historical Provider, MD  metoprolol tartrate (LOPRESSOR) 25 MG tablet Take 25 mg by mouth 2 (two) times daily. 05/30/14  Yes Carlena Bjornstad, MD  Multiple Vitamins-Minerals (ICAPS) CAPS Take 1 capsule by mouth daily.    Yes Historical Provider, MD  Omega-3 Fatty Acids (FISH OIL) 1000 MG CAPS Take 1,000 mg by mouth daily.    Yes Historical Provider, MD  pantoprazole (PROTONIX) 40 MG tablet Take 40 mg by mouth 2 (two) times daily.   Yes Historical Provider, MD  traMADol (ULTRAM) 50 MG tablet Take 50 mg by mouth every 8 (eight) hours as needed (pain).   Yes Historical Provider, MD  valsartan (DIOVAN) 80 MG tablet Take 1 tablet (80 mg total) by mouth daily. 05/30/14  Yes Carlena Bjornstad, MD  vitamin E 400 UNIT capsule Take 400 Units by mouth daily.     Yes Historical Provider, MD  nitroGLYCERIN (NITROSTAT) 0.4 MG SL tablet Place 1 tablet (0.4 mg total) under the tongue every 5 (five) minutes as needed for chest pain. As directed and as needed. 05/30/14   Carlena Bjornstad, MD   BP 174/77  Pulse 87  Temp(Src) 97.4 F (36.3 C) (Oral)  Resp 16  SpO2 95% Physical Exam 1020: Physical examination:  Nursing notes reviewed; Vital signs and O2 SAT reviewed;  Constitutional: Well developed, Well nourished, Well hydrated, In no acute distress; Head:  Normocephalic, atraumatic; Eyes: EOMI, PERRL, No scleral icterus; ENMT: Mouth and pharynx  normal, Mucous membranes moist; Neck: Supple, Full range of motion, No lymphadenopathy; Cardiovascular: Regular rate and rhythm, No gallop; Respiratory: Breath sounds coarse & equal bilaterally, No wheezes.  Speaking full sentences with ease, Normal respiratory effort/excursion; Chest: Nontender, Movement normal; Abdomen: Soft, Nontender, Nondistended, Normal bowel sounds; Genitourinary: No CVA tenderness; Extremities: Pulses normal, No tenderness, No edema, No calf edema or asymmetry.; Neuro: AAOx3, vague historian. Major CN grossly intact.  Speech clear. No gross focal motor or sensory deficits in extremities.; Skin: Color normal, Warm, Dry.   ED Course  Procedures     EKG Interpretation  Date/Time:  Wednesday June 11 2014 09:59:26 EDT Ventricular Rate:  89 PR Interval:  189 QRS Duration: 131 QT Interval:  409 QTC Calculation: 498 R Axis:   -95 Text Interpretation:  Sinus rhythm Left axis deviation Left bundle branch  block Anterior infarct, old Baseline wander When compared with ECG of  10/14/2009 No significant change was found Confirmed by [facility]  MD,  Nunzio Cory [phone]) on 06/11/2014 10:51:18 AM      MDM  MDM Reviewed: previous chart, nursing note and vitals Reviewed previous: labs and ECG Interpretation: labs, ECG and x-ray Total time providing critical care: 30-74 minutes. This excludes time spent performing separately reportable procedures and services. Consults: cardiology and admitting MD    CRITICAL CARE Performed by: Alfonzo Feller Total critical care time: 35 Critical care time was exclusive of separately billable procedures and treating other patients. Critical care was necessary to treat or prevent imminent or life-threatening deterioration. Critical care was time spent personally by me on the following activities: development of treatment plan with patient and/or surrogate as well as nursing, discussions with consultants, evaluation of patient's response to  treatment, examination of patient, obtaining history from patient or surrogate, ordering and performing treatments and interventions, ordering and review of laboratory studies, ordering and review of radiographic studies, pulse oximetry and re-evaluation of patient's condition.   Results for orders placed during the hospital encounter of 02/40/97  BASIC METABOLIC PANEL      Result Value Ref Range   Sodium 144  137 - 147 mEq/L   Potassium 4.2  3.7 - 5.3 mEq/L   Chloride 105  96 - 112 mEq/L   CO2 22  19 - 32 mEq/L   Glucose, Bld 132 (*) 70 - 99 mg/dL   BUN 33 (*) 6 - 23 mg/dL   Creatinine, Ser 0.87  0.50 - 1.10 mg/dL   Calcium 9.9  8.4 - 10.5 mg/dL   GFR calc non Af Amer 57 (*) >90 mL/min   GFR calc Af Amer 65 (*) >90 mL/min  CBC WITH DIFFERENTIAL      Result Value Ref Range   WBC 9.6  4.0 - 10.5 K/uL   RBC 4.36  3.87 - 5.11 MIL/uL   Hemoglobin 13.4  12.0 - 15.0 g/dL   HCT 39.9  36.0 - 46.0 %   MCV 91.5  78.0 - 100.0 fL   MCH 30.7  26.0 - 34.0 pg   MCHC 33.6  30.0 - 36.0 g/dL   RDW 14.1  11.5 - 15.5 %   Platelets 260  150 - 400 K/uL   Neutrophils Relative % 78 (*) 43 - 77 %   Neutro Abs 7.5  1.7 - 7.7 K/uL   Lymphocytes Relative 14  12 - 46 %   Lymphs Abs 1.3  0.7 - 4.0 K/uL   Monocytes Relative 7  3 - 12 %   Monocytes Absolute 0.7  0.1 - 1.0 K/uL   Eosinophils Relative 1  0 - 5 %   Eosinophils Absolute 0.1  0.0 - 0.7 K/uL   Basophils Relative 0  0 - 1 %   Basophils Absolute 0.0  0.0 - 0.1 K/uL  PRO B NATRIURETIC PEPTIDE      Result Value Ref Range   Pro B Natriuretic peptide (BNP) 7029.0 (*) 0 - 450 pg/mL  I-STAT TROPOININ, ED      Result Value Ref Range   Troponin i, poc 0.35 (*) 0.00 - 0.08 ng/mL   Comment NOTIFIED PHYSICIAN  Comment 3            Dg Chest Port 1 View 06/11/2014   CLINICAL DATA:  Chest pain.  EXAM: PORTABLE CHEST - 1 VIEW  COMPARISON:  CT chest 08/04/2005.  FINDINGS: Mediastinum unremarkable. Stable mild hilar prominence most likely vascular present.  Borderline cardiomegaly. No significant pulmonary venous congestion. No focal pulmonary infiltrate. No pleural effusion or pneumothorax.  IMPRESSION: 1. Stable borderline cardiomegaly. 2. No acute cardiopulmonary disease.   Electronically Signed   By: Marcello Moores  Register   On: 06/11/2014 11:28    1240:  Troponin elevated; ASA given. Pt continues to deny CP. BNP elevated, no old to compare; will dose IV lasix. Dx and testing d/w pt and family.  Questions answered.  Verb understanding, agreeable to admit. T/C to Cardiology, case discussed, including:  HPI, pertinent PM/SHx, VS/PE, dx testing, ED course and treatment:  Agreeable to come to ED for evaluation for admission.    Alfonzo Feller, DO 06/14/14 2227

## 2014-06-11 NOTE — Discharge Summary (Deleted)
Primary Cardiologist: Dr. Ron Parker  CC: Chest Pain   HPI: The patient is a 80 y/o female, followed by Dr. Ron Parker, who presents to the ER with a complaint of chest pain. Her past medical history is significant for hypertension, hyperlipidemia, diabetes and chronic left bundle branch block. Her last ischemic evaluation was nearly 10 years ago. Left heart catheterization, performed 09/24/2004, revealed minimal coronary artery disease. The left main was angiographically normal. The LAD was moderate in size, giving rise to a single diagonal branch. There was only mild luminal irregularities along its course. The circumflex was also a large vessel giving rise to 2 large obtuse marginals. It was angiographically normal. The RCA was relatively small, though dominant vessel and was also angiographically normal. Left ventricular ejection fraction was normal at 65% without regional wall motion abnormalities. Her last 2-D echocardiogram was 10/15/2009, revealing moderately reduced systolic function with an estimated  EF of 40%. There was septal and apical akinesis. Both mild aortic regurgitation and mitral regurgitation were noted. Her last office visit with Dr. Ron Parker was 05/30/2014. At that time she was felt to be stable from a cardiac standpoint.  She presents to the [facility] emergency department today with complaints of substernal chest pain and worsening shortness of breath. Her symptoms have occurred off and on for the last several weeks and have progressively worsened over the last 2-3 days. She notes substernal chest tightness/heaviness that radiates to her neck and bilateral upper extremities. Her pain is worse with exertion. She has not tried sublingual nitroglycerin at home. She also notes significant shortness of breath at rest that is also worse with exertion. She denies orthopnea, PND and lower extremity edema. She does note frequent nausea and vomiting, however she states that this is not in relation to her  chest pain. She denies syncope/near syncope. She states that she has been fairly compliant with her medications, although she missed her medications yesterday morning. She denies any recent intake of high sodium foods.  In the ER, her EKG demonstrates normal sinus rhythm with a heart rate of 89 beats per minute. It also demonstrates a left bundle branch block which is old. Point of care troponin in the ER is elevated at 0.35. BNP is elevated at 7029.0. Serum creatinine is normal at 0.87. Chest x-ray is unremarkable.   Past Medical History  Diagnosis Date  . Diabetes mellitus     type II  . Diverticulosis of colon   . GERD (gastroesophageal reflux disease)   . Hyperlipidemia   . Hypertension   . Arthritis     OA  . Peripheral vascular disease   . PMR (polymyalgia rheumatica)   . Frequent UTI   . Chest tightness     catheterization, 2005, normal coronaries  . Wide-complex tachycardia     SVT in past  . Hypokalemia   . LBBB (left bundle branch block)     Since at least 2005  . Ejection fraction < 50%     EF 40% echo, 09/2009    Past Surgical History  Procedure Laterality Date  . Appendectomy  1993  . Cholecystectomy    . Colectomy  1993    partial  . Abdominal hysterectomy  1960s    fibroids total  . Vein ligation and stripping    . Bladder tack up    . Back surgery    . Esophagogastroduodenoscopy  05/2004  . Bilateral pyelonephritis    . Cardiac catheterization  2005    no significant CAD  Family History  Problem Relation Age of Onset  . Heart disease Mother     CAD  . Hypertension Mother   . Stroke Mother   . Cancer Sister     pancreatic CA  . Diabetes Brother     Social History:  reports that she has never smoked. She has never used smokeless tobacco. She reports that she does not drink alcohol or use illicit drugs.  Allergies:  Allergies  Allergen Reactions  . Erythromycin     REACTION: Rash  . Metformin     REACTION: vomiting and diarrhea  .  Metronidazole     REACTION: whelps  . Nitrofurantoin Hives    REACTION: Reaction not know  . Nsaids     REACTION: GI symptoms  . Rofecoxib     REACTION: GI symptoms  . Simvastatin     REACTION: myalgia  . Sulfamethoxazole-Trimethoprim     REACTION: itching  . Tetracycline     REACTION: Rash    Medications:  Prior to Admission medications   Medication Sig Start Date End Date Taking? Authorizing Tanna Loeffler  acetaminophen (TYLENOL) 500 MG tablet Take 500 mg by mouth every 6 (six) hours as needed (pain).    Yes Historical Aarilyn Dye, MD  aspirin 81 MG tablet Take 81 mg by mouth daily.     Yes Historical Niclas Markell, MD  cephALEXin (KEFLEX) 250 MG capsule Take 250 mg by mouth at bedtime. Continuous UTI prevention   Yes Historical Reylynn Vanalstine, MD  cetirizine (ZYRTEC) 10 MG tablet Take 5 mg by mouth daily.     Yes Historical Connar Keating, MD  cholecalciferol (VITAMIN D) 1000 UNITS tablet Take 1,000 Units by mouth daily.     Yes Historical Alessander Sikorski, MD  CINNAMON PO Take 2,000 mg by mouth daily.    Yes Historical Teresha Hanks, MD  darifenacin (ENABLEX) 7.5 MG 24 hr tablet Take 7.5 mg by mouth daily.     Yes Historical Farhaan Mabee, MD  diclofenac sodium (VOLTAREN) 1 % GEL Apply 2 g topically 2 (two) times daily as needed (knee pain).    Yes Historical Zimir Kittleson, MD  hydrochlorothiazide (HYDRODIURIL) 25 MG tablet Take 1 tablet (25 mg total) by mouth daily. 05/30/14  Yes Carlena Bjornstad, MD  isosorbide mononitrate (IMDUR) 30 MG 24 hr tablet Take 30 mg by mouth at bedtime.   Yes Historical Daysha Ashmore, MD  metoprolol tartrate (LOPRESSOR) 25 MG tablet Take 25 mg by mouth 2 (two) times daily. 05/30/14  Yes Carlena Bjornstad, MD  Multiple Vitamins-Minerals (ICAPS) CAPS Take 1 capsule by mouth daily.    Yes Historical Ada Woodbury, MD  Omega-3 Fatty Acids (FISH OIL) 1000 MG CAPS Take 1,000 mg by mouth daily.    Yes Historical Desi Rowe, MD  pantoprazole (PROTONIX) 40 MG tablet Take 40 mg by mouth 2 (two) times daily.   Yes Historical  Marven Veley, MD  traMADol (ULTRAM) 50 MG tablet Take 50 mg by mouth every 8 (eight) hours as needed (pain).   Yes Historical Anina Schnake, MD  valsartan (DIOVAN) 80 MG tablet Take 1 tablet (80 mg total) by mouth daily. 05/30/14  Yes Carlena Bjornstad, MD  vitamin E 400 UNIT capsule Take 400 Units by mouth daily.     Yes Historical Saydi Kobel, MD  nitroGLYCERIN (NITROSTAT) 0.4 MG SL tablet Place 1 tablet (0.4 mg total) under the tongue every 5 (five) minutes as needed for chest pain. As directed and as needed. 05/30/14   Carlena Bjornstad, MD     Results for orders placed during  the hospital encounter of 06/11/14 (from the past 48 hour(s))  BASIC METABOLIC PANEL     Status: Abnormal   Collection Time    06/11/14 11:09 AM      Result Value Ref Range   Sodium 144  137 - 147 mEq/L   Potassium 4.2  3.7 - 5.3 mEq/L   Chloride 105  96 - 112 mEq/L   CO2 22  19 - 32 mEq/L   Glucose, Bld 132 (*) 70 - 99 mg/dL   BUN 33 (*) 6 - 23 mg/dL   Creatinine, Ser 0.87  0.50 - 1.10 mg/dL   Calcium 9.9  8.4 - 10.5 mg/dL   GFR calc non Af Amer 57 (*) >90 mL/min   GFR calc Af Amer 65 (*) >90 mL/min   Comment: (NOTE)     The eGFR has been calculated using the CKD EPI equation.     This calculation has not been validated in all clinical situations.     eGFR's persistently <90 mL/min signify possible Chronic Kidney     Disease.  CBC WITH DIFFERENTIAL     Status: Abnormal   Collection Time    06/11/14 11:09 AM      Result Value Ref Range   WBC 9.6  4.0 - 10.5 K/uL   RBC 4.36  3.87 - 5.11 MIL/uL   Hemoglobin 13.4  12.0 - 15.0 g/dL   HCT 39.9  36.0 - 46.0 %   MCV 91.5  78.0 - 100.0 fL   MCH 30.7  26.0 - 34.0 pg   MCHC 33.6  30.0 - 36.0 g/dL   RDW 14.1  11.5 - 15.5 %   Platelets 260  150 - 400 K/uL   Neutrophils Relative % 78 (*) 43 - 77 %   Neutro Abs 7.5  1.7 - 7.7 K/uL   Lymphocytes Relative 14  12 - 46 %   Lymphs Abs 1.3  0.7 - 4.0 K/uL   Monocytes Relative 7  3 - 12 %   Monocytes Absolute 0.7  0.1 - 1.0 K/uL    Eosinophils Relative 1  0 - 5 %   Eosinophils Absolute 0.1  0.0 - 0.7 K/uL   Basophils Relative 0  0 - 1 %   Basophils Absolute 0.0  0.0 - 0.1 K/uL  PRO B NATRIURETIC PEPTIDE     Status: Abnormal   Collection Time    06/11/14 11:09 AM      Result Value Ref Range   Pro B Natriuretic peptide (BNP) 7029.0 (*) 0 - 450 pg/mL  I-STAT TROPOININ, ED     Status: Abnormal   Collection Time    06/11/14 11:43 AM      Result Value Ref Range   Troponin i, poc 0.35 (*) 0.00 - 0.08 ng/mL   Comment NOTIFIED PHYSICIAN     Comment 3            Comment: Due to the release kinetics of cTnI,     a negative result within the first hours     of the onset of symptoms does not rule out     myocardial infarction with certainty.     If myocardial infarction is still suspected,     repeat the test at appropriate intervals.    Dg Chest Port 1 View  06/11/2014   CLINICAL DATA:  Chest pain.  EXAM: PORTABLE CHEST - 1 VIEW  COMPARISON:  CT chest 08/04/2005.  FINDINGS: Mediastinum unremarkable. Stable mild hilar prominence most likely  vascular present. Borderline cardiomegaly. No significant pulmonary venous congestion. No focal pulmonary infiltrate. No pleural effusion or pneumothorax.  IMPRESSION: 1. Stable borderline cardiomegaly. 2. No acute cardiopulmonary disease.   Electronically Signed   By: Marcello Moores  Register   On: 06/11/2014 11:28    Review of Systems  Respiratory: Positive for shortness of breath.   Cardiovascular: Positive for chest pain. Negative for orthopnea, leg swelling and PND.  Gastrointestinal: Positive for nausea.  Neurological: Positive for dizziness. Negative for loss of consciousness.  All other systems reviewed and are negative.  Blood pressure 145/75, pulse 84, temperature 97.4 F (36.3 C), temperature source Oral, resp. rate 17, height 5' 1.81" (1.57 m), weight 144 lb 13.5 oz (65.7 kg), SpO2 100.00%. Physical Exam  Constitutional: She is oriented to person, place, and time. She appears  well-developed and well-nourished. No distress.  Neck: JVD (mild) present.  Cardiovascular: Normal rate, regular rhythm and normal heart sounds.  Exam reveals no gallop and no friction rub.   No murmur heard. Respiratory: No respiratory distress. She has no wheezes. She has no rales.  Musculoskeletal: She exhibits no edema.  Neurological: She is alert and oriented to person, place, and time.  Skin: Skin is warm and dry. She is not diaphoretic.  Psychiatric: She has a normal mood and affect. Her behavior is normal.    Assessment/Plan: Active Problems:   LBBB (left bundle branch block)   Unstable angina   NSTEMI (non-ST elevated myocardial infarction)   HTN (hypertension)    1. Chest Pain: Patient's pain is very typical of unstable angina. Her EKG demonstrates a chronic left bundle branch block. Point of care troponin is elevated at 0.32. Will start IV heparin per pharmacy. She still has mild ongoing pain. We'll start IV nitroglycerin and will continue home beta blocker and ARB. We'll check a 2-D echo to see if there's been any change in systolic function and will also look for wall motion abnormalities. Continue to cycle cardiac enzymes x3. Will make NPO at midnight and will plan for left heart catheterization tomorrow morning. We'll admit to step down bed.  2. Acute on Chronic Systolic HF: BMP is elevated at [place]. Patient was given 40 mg of IV Lasix in the ER. She is profoundly short of breath, however she does not appear significantly volume overloaded. Renal function is okay. Will give another dose of IV Lasix later tonight and will reevaluate in the morning. Hydrochlorothiazide. We'll also repeat a 2-D echo to reassess systolic function. Order daily weights, strict I/Os and low-sodium diet.  SIMMONS, [place] 06/11/2014, 1:54 PM

## 2014-06-11 NOTE — Progress Notes (Addendum)
ANTICOAGULATION CONSULT NOTE - Follow Up Consult  Pharmacy Consult for Heparin  Indication: chest pain/ACS  Allergies  Allergen Reactions  . Erythromycin     REACTION: Rash  . Metformin     REACTION: vomiting and diarrhea  . Metronidazole     REACTION: whelps  . Nitrofurantoin Hives    REACTION: Reaction not know  . Nsaids     REACTION: GI symptoms  . Rofecoxib     REACTION: GI symptoms  . Simvastatin     REACTION: myalgia  . Sulfamethoxazole-Trimethoprim     REACTION: itching  . Tetracycline     REACTION: Rash    Patient Measurements: Height: 5\' 2"  (157.5 cm) Weight: 146 lb 9.7 oz (66.5 kg) IBW/kg (Calculated) : 50.1  Vital Signs: Temp: 97.5 F (36.4 C) (06/24 2000) Temp src: Oral (06/24 2000) BP: 102/41 mmHg (06/24 2200) Pulse Rate: 82 (06/24 2200)  Labs:  Recent Labs  06/11/14 1109 06/11/14 2001 06/11/14 2235  HGB 13.4  --   --   HCT 39.9  --   --   PLT 260  --   --   HEPARINUNFRC  --   --  0.33  CREATININE 0.87  --   --   CKTOTAL  --  57  --   CKMB  --  4.6*  --   TROPONINI  --  0.56*  --     Estimated Creatinine Clearance: 37.7 ml/min (by C-G formula based on Cr of 0.87).   Medications:  Heparin 700 units/hr  Assessment: 80 y/o F on heparin for elevated troponin. HL is 0.33. Other labs as above.   Goal of Therapy:  Heparin level 0.3-0.7 units/ml Monitor platelets by anticoagulation protocol: Yes   Plan:  -Continue heparin drip at 700 units/hr -AM HL to confirm -Daily CBC/HL -Monitor for bleeding  Narda Bonds 06/11/2014,11:21 PM  Addendum 6:11 AM HL this AM is 0.29, increase drip to 800 units/hr, pt to cath at 0730 per RN so will not order f/u level now, f/u after cath for any further heparin needs Sharlynn Oliphant, PharmD

## 2014-06-11 NOTE — Care Management Note (Addendum)
    Page 1 of 2   06/14/2014     12:24:33 PM CARE MANAGEMENT NOTE 06/14/2014  Patient:  Ellen, Peterson   Account Number:  [id number]  Date Initiated:  06/11/2014  Documentation initiated by:  Elissa Hefty  Subjective/Objective Assessment:   adm w mi     Action/Plan:   lives w fam   Anticipated DC Date:     Anticipated DC Plan:        [place]  CM consult      [place]   Choice offered to / List presented to:  C-1 Patient   DME arranged  [facility]      DME agency  [place] arranged  HH-1 RN  [place] [facility]      [place].   Status of service:  Completed, signed off Medicare Important Message given?  YES (If response is "NO", the following Medicare IM given date fields will be blank) Date Medicare IM given:  06/14/2014 Date Additional Medicare IM given:    Discharge Disposition:  [place]  Per UR Regulation:  Reviewed for med. necessity/level of care/duration of stay  If discussed at [place] of Stay Meetings, dates discussed:    Comments:  06-14-14 [place], RN, BSN [phone] CM did have a lenghty conversation with pt and daughters. All agree to [facility] services RN and PT with [facility]. CM did make referral with [facility]. MD please write orders for [facility] CHF/ diet and medication management along with PT order for evauluation and treatment. DME orders for 02 to be placed in epic. CM did make referral for [facility] services and DME 02 to be delivered to room before d/c. RN toprovide information on nutrition information. No further needs from CM at this time.

## 2014-06-11 NOTE — ED Notes (Signed)
Called pharmacy and confirmed that nitro 0.2mg /ml and heparin 25000/220ml are compatible to run in the same line

## 2014-06-11 NOTE — Telephone Encounter (Signed)
Patient Information:  Caller Name: Vaughan Basta  Phone: [phone]  Patient: Ellen Peterson, Ellen Peterson  Gender: Female  DOB: February 19, 1920  Age: 80 Years  PCP: [name] [facility])  Office Follow Up:  Does the office need to follow up with this patient?: No  Instructions For The Office: N/A  RN Note:  Respirations 20-28 rpm.  Pulse Ox 93%.  Pulse 97. Cannot talk in full sentences when sitting. Chest pain (pressure) present with exertion.  Had chest pain while sitting on commode this morning so had to remain there "for a few minutes."  Also delayed getting up to let dog out to avoid chest pain with exertion. Reported episode of chest pain for > 10 minutes 06/10/14.  Daughter sys Olegario Shearer requesting office visit.  Informed office not equiped to handle life threatening emergent situations; office would transfer patient with chest pain and shortness of breath to ED via 911. Agreed to call 911 without Lanise's approval so medics can assess her status now.    Symptoms  Reason For Call & Symptoms: Dyspnea worsening for past week.  Had upper chest pain with exertion 06/10/14.  Reviewed Health History In EMR: Yes  Reviewed Medications In EMR: Yes  Reviewed Allergies In EMR: Yes  Reviewed Surgeries / Procedures: Yes  Date of Onset of Symptoms: 06/04/2014  Treatments Tried: lowered air conditioner, humidifier.  Treatments Tried Worked: No  Guideline(s) Used:  Breathing Difficulty  Chest Pain  Disposition Per Guideline:   Call EMS 911 Now  Reason For Disposition Reached:   Chest pain lasting longer than 5 minutes and ANY of the following:  Over 75 years old Over 34 years old and at least one cardiac risk factor (i.e., high blood pressure, diabetes, high cholesterol, obesity, smoker or strong family history of heart disease) Pain is crushing, pressure-like, or heavy  Took nitroglycerin and chest pain was not relieved History of heart disease (i.e., angina, heart attack, bypass surgery, angioplasty,  CHF)  Advice Given:  N/A  Patient Will Follow Care Advice:  YES

## 2014-06-11 NOTE — ED Notes (Addendum)
Pt presents to department for evaluation of diffuse chest tightness and SOB. Ongoing since Monday. States she gets short of breath on exertion. Denies chest pain upon arrival. 20g LAC. Pt is alert and oriented x4. Skin warm and dry. Received 324 ASA per EMS.

## 2014-06-11 NOTE — ED Notes (Signed)
Results of troponin given to [place], DO on Pod A

## 2014-06-11 NOTE — H&P (Signed)
Primary Cardiologist: Dr. Ron Parker   CC: Chest Pain   HPI: The patient is a 80 y/o female, followed by Dr. Ron Parker, who presents to the ER with a complaint of chest pain. Her past medical history is significant for hypertension, hyperlipidemia, diabetes and chronic left bundle branch block. Her last ischemic evaluation was nearly 10 years ago. Left heart catheterization, performed 09/24/2004, revealed minimal coronary artery disease. The left main was angiographically normal. The LAD was moderate in size, giving rise to a single diagonal branch. There was only mild luminal irregularities along its course. The circumflex was also a large vessel giving rise to 2 large obtuse marginals. It was angiographically normal. The RCA was relatively small, though dominant vessel and was also angiographically normal. Left ventricular ejection fraction was normal at 65% without regional wall motion abnormalities. Her last 2-D echocardiogram was 10/15/2009, revealing moderately reduced systolic function with an estimated EF of 40%. There was septal and apical akinesis. Both mild aortic regurgitation and mitral regurgitation were noted. Her last office visit with Dr. Ron Parker was 05/30/2014. At that time she was felt to be stable from a cardiac standpoint.    She presents to the [facility] emergency department today with complaints of substernal chest pain and worsening shortness of breath. Her symptoms have occurred off and on for the last several weeks and have progressively worsened over the last 2-3 days. She notes substernal chest tightness/heaviness that radiates to her neck and bilateral upper extremities. Her pain is worse with exertion. She has not tried sublingual nitroglycerin at home. She also notes significant shortness of breath at rest that is also worse with exertion. She denies orthopnea, PND and lower extremity edema. She does note frequent nausea and vomiting, however she states that this is not in relation to her  chest pain. She denies syncope/near syncope. She states that she has been fairly compliant with her medications, although she missed her medications yesterday morning. She denies any recent intake of high sodium foods.    In the ER, her EKG demonstrates normal sinus rhythm with a heart rate of 89 beats per minute. It also demonstrates a left bundle branch block which is old. Point of care troponin in the ER is elevated at 0.35. BNP is elevated at 7029.0. Serum creatinine is normal at 0.87. Chest x-ray is unremarkable.    Past Medical History   Diagnosis  Date   .  Diabetes mellitus      type II   .  Diverticulosis of colon    .  GERD (gastroesophageal reflux disease)    .  Hyperlipidemia    .  Hypertension    .  Arthritis      OA   .  Peripheral vascular disease    .  PMR (polymyalgia rheumatica)    .  Frequent UTI    .  Chest tightness      catheterization, 2005, normal coronaries   .  Wide-complex tachycardia      SVT in past   .  Hypokalemia    .  LBBB (left bundle branch block)      Since at least 2005   .  Ejection fraction < 50%      EF 40% echo, 09/2009    Past Surgical History   Procedure  Laterality  Date   .  Appendectomy   1993   .  Cholecystectomy     .  Colectomy   1993     partial   .  Abdominal hysterectomy   1960s     fibroids total   .  Vein ligation and stripping     .  Bladder tack up     .  Back surgery     .  Esophagogastroduodenoscopy   05/2004   .  Bilateral pyelonephritis     .  Cardiac catheterization   2005     no significant CAD    Family History   Problem  Relation  Age of Onset   .  Heart disease  Mother      CAD   .  Hypertension  Mother    .  Stroke  Mother    .  Cancer  Sister      pancreatic CA   .  Diabetes  Brother     Social History: reports that she has never smoked. She has never used smokeless tobacco. She reports that she does not drink alcohol or use illicit drugs.  Allergies:  Allergies   Allergen  Reactions   .   Erythromycin      REACTION: Rash   .  Metformin      REACTION: vomiting and diarrhea   .  Metronidazole      REACTION: whelps   .  Nitrofurantoin  Hives     REACTION: Reaction not know   .  Nsaids      REACTION: GI symptoms   .  Rofecoxib      REACTION: GI symptoms   .  Simvastatin      REACTION: myalgia   .  Sulfamethoxazole-Trimethoprim      REACTION: itching   .  Tetracycline      REACTION: Rash    Medications:  Prior to Admission medications   Medication  Sig  Start Date  End Date  Taking?  Authorizing Provider   acetaminophen (TYLENOL) 500 MG tablet  Take 500 mg by mouth every 6 (six) hours as needed (pain).    Yes  Historical Provider, MD   aspirin 81 MG tablet  Take 81 mg by mouth daily.    Yes  Historical Provider, MD   cephALEXin (KEFLEX) 250 MG capsule  Take 250 mg by mouth at bedtime. Continuous UTI prevention    Yes  Historical Provider, MD   cetirizine (ZYRTEC) 10 MG tablet  Take 5 mg by mouth daily.    Yes  Historical Provider, MD   cholecalciferol (VITAMIN D) 1000 UNITS tablet  Take 1,000 Units by mouth daily.    Yes  Historical Provider, MD   CINNAMON PO  Take 2,000 mg by mouth daily.    Yes  Historical Provider, MD   darifenacin (ENABLEX) 7.5 MG 24 hr tablet  Take 7.5 mg by mouth daily.    Yes  Historical Provider, MD   diclofenac sodium (VOLTAREN) 1 % GEL  Apply 2 g topically 2 (two) times daily as needed (knee pain).    Yes  Historical Provider, MD   hydrochlorothiazide (HYDRODIURIL) 25 MG tablet  Take 1 tablet (25 mg total) by mouth daily.  05/30/14   Yes  Carlena Bjornstad, MD   isosorbide mononitrate (IMDUR) 30 MG 24 hr tablet  Take 30 mg by mouth at bedtime.    Yes  Historical Provider, MD   metoprolol tartrate (LOPRESSOR) 25 MG tablet  Take 25 mg by mouth 2 (two) times daily.  05/30/14   Yes  Carlena Bjornstad, MD   Multiple Vitamins-Minerals (ICAPS) CAPS  Take 1 capsule by mouth  daily.    Yes  Historical Provider, MD   Omega-3 Fatty Acids (FISH OIL) 1000 MG CAPS   Take 1,000 mg by mouth daily.    Yes  Historical Provider, MD   pantoprazole (PROTONIX) 40 MG tablet  Take 40 mg by mouth 2 (two) times daily.    Yes  Historical Provider, MD   traMADol (ULTRAM) 50 MG tablet  Take 50 mg by mouth every 8 (eight) hours as needed (pain).    Yes  Historical Provider, MD   valsartan (DIOVAN) 80 MG tablet  Take 1 tablet (80 mg total) by mouth daily.  05/30/14   Yes  Carlena Bjornstad, MD   vitamin E 400 UNIT capsule  Take 400 Units by mouth daily.    Yes  Historical Provider, MD   nitroGLYCERIN (NITROSTAT) 0.4 MG SL tablet  Place 1 tablet (0.4 mg total) under the tongue every 5 (five) minutes as needed for chest pain. As directed and as needed.  05/30/14    Carlena Bjornstad, MD    Results for orders placed during the hospital encounter of 06/11/14 (from the past 48 hour(s))   BASIC METABOLIC PANEL Status: Abnormal    Collection Time    06/11/14 11:09 AM   Result  Value  Ref Range    Sodium  144  137 - 147 mEq/L    Potassium  4.2  3.7 - 5.3 mEq/L    Chloride  105  96 - 112 mEq/L    CO2  22  19 - 32 mEq/L    Glucose, Bld  132 (*)  70 - 99 mg/dL    BUN  33 (*)  6 - 23 mg/dL    Creatinine, Ser  0.87  0.50 - 1.10 mg/dL    Calcium  9.9  8.4 - 10.5 mg/dL    GFR calc non Af Amer  57 (*)  >90 mL/min    GFR calc Af Amer  65 (*)  >90 mL/min    Comment:  (NOTE)     The eGFR has been calculated using the CKD EPI equation.     This calculation has not been validated in all clinical situations.     eGFR's persistently <90 mL/min signify possible Chronic Kidney     Disease.   CBC WITH DIFFERENTIAL Status: Abnormal    Collection Time    06/11/14 11:09 AM   Result  Value  Ref Range    WBC  9.6  4.0 - 10.5 K/uL    RBC  4.36  3.87 - 5.11 MIL/uL    Hemoglobin  13.4  12.0 - 15.0 g/dL    HCT  39.9  36.0 - 46.0 %    MCV  91.5  78.0 - 100.0 fL    MCH  30.7  26.0 - 34.0 pg    MCHC  33.6  30.0 - 36.0 g/dL    RDW  14.1  11.5 - 15.5 %    Platelets  260  150 - 400 K/uL    Neutrophils  Relative %  78 (*)  43 - 77 %    Neutro Abs  7.5  1.7 - 7.7 K/uL    Lymphocytes Relative  14  12 - 46 %    Lymphs Abs  1.3  0.7 - 4.0 K/uL    Monocytes Relative  7  3 - 12 %    Monocytes Absolute  0.7  0.1 - 1.0 K/uL    Eosinophils Relative  1  0 - 5 %    Eosinophils Absolute  0.1  0.0 - 0.7 K/uL    Basophils Relative  0  0 - 1 %    Basophils Absolute  0.0  0.0 - 0.1 K/uL   PRO B NATRIURETIC PEPTIDE Status: Abnormal    Collection Time    06/11/14 11:09 AM   Result  Value  Ref Range    Pro B Natriuretic peptide (BNP)  7029.0 (*)  0 - 450 pg/mL   I-STAT TROPOININ, ED Status: Abnormal    Collection Time    06/11/14 11:43 AM   Result  Value  Ref Range    Troponin i, poc  0.35 (*)  0.00 - 0.08 ng/mL    Comment  NOTIFIED PHYSICIAN     Comment 3      Comment:  Due to the release kinetics of cTnI,     a negative result within the first hours     of the onset of symptoms does not rule out     myocardial infarction with certainty.     If myocardial infarction is still suspected,     repeat the test at appropriate intervals.    Dg Chest Port 1 View  06/11/2014 CLINICAL DATA: Chest pain. EXAM: PORTABLE CHEST - 1 VIEW COMPARISON: CT chest 08/04/2005. FINDINGS: Mediastinum unremarkable. Stable mild hilar prominence most likely vascular present. Borderline cardiomegaly. No significant pulmonary venous congestion. No focal pulmonary infiltrate. No pleural effusion or pneumothorax. IMPRESSION: 1. Stable borderline cardiomegaly. 2. No acute cardiopulmonary disease. Electronically Signed By: Marcello Moores Register On: 06/11/2014 11:28   Review of Systems  Respiratory: Positive for shortness of breath.  Cardiovascular: Positive for chest pain. Negative for orthopnea, leg swelling and PND.  Gastrointestinal: Positive for nausea.  Neurological: Positive for dizziness. Negative for loss of consciousness.  All other systems reviewed and are negative.   Blood pressure 145/75, pulse 84, temperature 97.4 F  (36.3 C), temperature source Oral, resp. rate 17, height 5' 1.81" (1.57 m), weight 144 lb 13.5 oz (65.7 kg), SpO2 100.00%.  Physical Exam  Constitutional: She is oriented to person, place, and time. She appears well-developed and well-nourished. No distress.  Neck: JVD (mild) present.  Cardiovascular: Normal rate, regular rhythm and normal heart sounds. Exam reveals no gallop and no friction rub.  No murmur heard.  Respiratory: No respiratory distress. She has no wheezes. She has no rales.  Musculoskeletal: She exhibits no edema.  Neurological: She is alert and oriented to person, place, and time.  Skin: Skin is warm and dry. She is not diaphoretic.  Psychiatric: She has a normal mood and affect. Her behavior is normal.   Assessment/Plan:  Active Problems:  LBBB (left bundle branch block)  Unstable angina  NSTEMI (non-ST elevated myocardial infarction)  HTN (hypertension)     1. Chest Pain: Patient's pain is very typical of unstable angina. Her EKG demonstrates a chronic left bundle branch block. Point of care troponin is elevated at 0.32. Will start IV heparin per pharmacy. She still has mild ongoing pain. We'll start IV nitroglycerin and will continue home beta blocker and ARB. We'll check a 2-D echo to see if there's been any change in systolic function and will also look for wall motion abnormalities. Continue to cycle cardiac enzymes x3. Will make NPO at midnight and will plan for left heart catheterization tomorrow morning. We'll admit to step down bed.    2. Acute on Chronic Systolic HF: BMP is elevated at [place]. Patient was given  40 mg of IV Lasix in the ER. She is profoundly short of breath, however she does not appear significantly volume overloaded. Renal function is okay. Will give another dose of IV Lasix later tonight and will reevaluate in the morning. Hydrochlorothiazide. We'll also repeat a 2-D echo to reassess systolic function. Order daily weights, strict I/Os and low-sodium  diet.     Ellen Peterson  06/11/2014, 1:54 PM    Agree with note written by Ellen Henri  PAC  69 y/o WF mother of 4 daughters (3 present), Ellen Peterson of Dr. Ron Parker. Clean cath 10 years ago. Chronic LBBB. Most recent EF 45% by 2D. H/O HTN. She has had 1 month H/O increasing DOE and exertional SSCP with radiation to jaw. Sx worse today. Currently pain free but appears uncomfortable.Exam benign. Lungs clear. Cor RRR. No periph edema. EKG LBBB. Labs remarkable for increased BNP 7K and borderline + POCM. Will admit to step down. IV hep/NTG. IV lasix. Cycle enzymes.  SCr OK. 2D. Needs cath tomorrow. Discussed with Ellen Peterson and daughters.   Lorretta Harp 06/11/2014 2:15 PM

## 2014-06-11 NOTE — Progress Notes (Signed)
ANTICOAGULATION CONSULT NOTE - Initial Consult  Pharmacy Consult:  Heparin Indication:  ACS  Allergies  Allergen Reactions  . Erythromycin     REACTION: Rash  . Metformin     REACTION: vomiting and diarrhea  . Metronidazole     REACTION: whelps  . Nitrofurantoin Hives    REACTION: Reaction not know  . Nsaids     REACTION: GI symptoms  . Rofecoxib     REACTION: GI symptoms  . Simvastatin     REACTION: myalgia  . Sulfamethoxazole-Trimethoprim     REACTION: itching  . Tetracycline     REACTION: Rash    Patient Measurements: Height: 5' 1.81" (157 cm) Weight: 144 lb 13.5 oz (65.7 kg) IBW/kg (Calculated) : 49.67 Heparin Dosing Weight: 62 kg  Vital Signs: Temp: 97.4 F (36.3 C) (06/24 0959) Temp src: Oral (06/24 0959) BP: 145/75 mmHg (06/24 1300) Pulse Rate: 84 (06/24 1300)  Labs:  Recent Labs  06/11/14 1109  HGB 13.4  HCT 39.9  PLT 260  CREATININE 0.87    Estimated Creatinine Clearance: 37.3 ml/min (by C-G formula based on Cr of 0.87).   Medical History: Past Medical History  Diagnosis Date  . Diabetes mellitus     type II  . Diverticulosis of colon   . GERD (gastroesophageal reflux disease)   . Hyperlipidemia   . Hypertension   . Arthritis     OA  . Peripheral vascular disease   . PMR (polymyalgia rheumatica)   . Frequent UTI   . Chest tightness     catheterization, 2005, normal coronaries  . Wide-complex tachycardia     SVT in past  . Hypokalemia   . LBBB (left bundle branch block)     Since at least 2005  . Ejection fraction < 50%     EF 40% echo, 09/2009       Assessment: 89 YOF with a significant cardiac history presented with several day history of chest tightness and SOB.  Pharmacy consulted to initiate IV heparin for ACS.  Baseline labs and home meds reviewed.   Goal of Therapy:  Heparin level 0.3-0.7 units/ml Monitor platelets by anticoagulation protocol: Yes    Plan:  - Heparin 3000 units IV bolus x 1, then - Heparin gtt  at 700 units/hr - Check 8 hr HL - Daily HL / CBC    Thuy D. Mina Marble, PharmD, BCPS Pager:  [phone] 06/11/2014, 1:54 PM

## 2014-06-12 ENCOUNTER — Encounter (HOSPITAL_COMMUNITY)
Admission: EM | Disposition: A | Discharge status: Home Health | Payer: Self-pay | Source: Home / Self Care | Attending: Cardiovascular Disease

## 2014-06-12 DIAGNOSIS — I214 Non-ST elevation (NSTEMI) myocardial infarction: Secondary | ICD-10-CM

## 2014-06-12 DIAGNOSIS — I369 Nonrheumatic tricuspid valve disorder, unspecified: Secondary | ICD-10-CM

## 2014-06-12 HISTORY — PX: LEFT HEART CATHETERIZATION WITH CORONARY ANGIOGRAM: SHX5451

## 2014-06-12 LAB — BASIC METABOLIC PANEL
BUN: 36 mg/dL — ABNORMAL HIGH (ref 6–23)
CALCIUM: 9.4 mg/dL (ref 8.4–10.5)
CO2: 25 meq/L (ref 19–32)
Chloride: 102 mEq/L (ref 96–112)
Creatinine, Ser: 1.37 mg/dL — ABNORMAL HIGH (ref 0.50–1.10)
GFR calc non Af Amer: 33 mL/min — ABNORMAL LOW (ref >90)
GFR, EST AFRICAN AMERICAN: 38 mL/min — AB (ref >90)
Glucose, Bld: 128 mg/dL — ABNORMAL HIGH (ref 70–99)
Potassium: 4.4 mEq/L (ref 3.7–5.3)
SODIUM: 142 meq/L (ref 137–147)

## 2014-06-12 LAB — CK TOTAL AND CKMB (NOT AT ARMC)
CK, MB: 3.7 ng/mL (ref 0.3–4.0)
Relative Index: 3.4 — ABNORMAL HIGH (ref 0.0–2.5)
Total CK: 110 U/L (ref 7–177)

## 2014-06-12 LAB — CBC
HCT: 38.2 % (ref 36.0–46.0)
HEMATOCRIT: 38.9 % (ref 36.0–46.0)
Hemoglobin: 12.6 g/dL (ref 12.0–15.0)
Hemoglobin: 12.9 g/dL (ref 12.0–15.0)
MCH: 30.7 pg (ref 26.0–34.0)
MCH: 30.9 pg (ref 26.0–34.0)
MCHC: 33 g/dL (ref 30.0–36.0)
MCHC: 33.2 g/dL (ref 30.0–36.0)
MCV: 92.9 fL (ref 78.0–100.0)
MCV: 93.3 fL (ref 78.0–100.0)
PLATELETS: 277 10*3/uL (ref 150–400)
PLATELETS: 265 10*3/uL (ref 150–400)
RBC: 4.11 MIL/uL (ref 3.87–5.11)
RBC: 4.17 MIL/uL (ref 3.87–5.11)
RDW: 14.5 % (ref 11.5–15.5)
RDW: 14.4 % (ref 11.5–15.5)
WBC: 10.3 10*3/uL (ref 4.0–10.5)
WBC: 9.7 10*3/uL (ref 4.0–10.5)

## 2014-06-12 LAB — GLUCOSE, CAPILLARY
GLUCOSE-CAPILLARY: 102 mg/dL — AB (ref 70–99)
Glucose-Capillary: 130 mg/dL — ABNORMAL HIGH (ref 70–99)
Glucose-Capillary: 147 mg/dL — ABNORMAL HIGH (ref 70–99)

## 2014-06-12 LAB — CREATININE, SERUM
Creatinine, Ser: 1.4 mg/dL — ABNORMAL HIGH (ref 0.50–1.10)
GFR calc non Af Amer: 32 mL/min — ABNORMAL LOW (ref >90)
GFR, EST AFRICAN AMERICAN: 37 mL/min — AB (ref >90)

## 2014-06-12 LAB — LIPID PANEL
Cholesterol: 207 mg/dL — ABNORMAL HIGH (ref 0–200)
HDL: 64 mg/dL (ref >39)
LDL Cholesterol: 123 mg/dL — ABNORMAL HIGH (ref 0–99)
TRIGLYCERIDES: 102 mg/dL (ref <150)
Total CHOL/HDL Ratio: 3.2 RATIO
VLDL: 20 mg/dL (ref 0–40)

## 2014-06-12 LAB — PROTIME-INR
INR: 1.13 (ref 0.00–1.49)
PROTHROMBIN TIME: 14.5 s (ref 11.6–15.2)

## 2014-06-12 LAB — TROPONIN I: TROPONIN I: 0.44 ng/mL — AB (ref <0.30)

## 2014-06-12 LAB — HEPARIN LEVEL (UNFRACTIONATED): Heparin Unfractionated: 0.29 IU/mL — ABNORMAL LOW (ref 0.30–0.70)

## 2014-06-12 SURGERY — LEFT HEART CATHETERIZATION WITH CORONARY ANGIOGRAM
Anesthesia: LOCAL

## 2014-06-12 MED ORDER — LIDOCAINE HCL (PF) 1 % IJ SOLN
INTRAMUSCULAR | Status: AC
  Filled 2014-06-12: qty 30

## 2014-06-12 MED ORDER — SODIUM CHLORIDE 0.9 % IV SOLN
1.0000 mL/kg/h | INTRAVENOUS | Status: AC
  Administered 2014-06-12: 1 mL/kg/h via INTRAVENOUS

## 2014-06-12 MED ORDER — NITROGLYCERIN 0.2 MG/ML ON CALL CATH LAB
INTRAVENOUS | Status: AC
  Filled 2014-06-12: qty 1

## 2014-06-12 MED ORDER — HEPARIN SODIUM (PORCINE) 1000 UNIT/ML IJ SOLN
INTRAMUSCULAR | Status: AC
  Filled 2014-06-12: qty 1

## 2014-06-12 MED ORDER — HEPARIN SODIUM (PORCINE) 5000 UNIT/ML IJ SOLN
5000.0000 [IU] | Freq: Three times a day (TID) | INTRAMUSCULAR | Status: DC
  Administered 2014-06-12 – 2014-06-14 (×5): 5000 [IU] via SUBCUTANEOUS
  Filled 2014-06-12 (×7): qty 1

## 2014-06-12 MED ORDER — HEPARIN (PORCINE) IN NACL 2-0.9 UNIT/ML-% IJ SOLN
INTRAMUSCULAR | Status: AC
  Filled 2014-06-12: qty 1000

## 2014-06-12 MED ORDER — VERAPAMIL HCL 2.5 MG/ML IV SOLN
INTRAVENOUS | Status: AC
  Filled 2014-06-12: qty 2

## 2014-06-12 NOTE — Progress Notes (Signed)
Echo Lab  2D Echocardiogram completed.  Melissa L Morford, RDCS 06/12/2014 9:45 AM

## 2014-06-12 NOTE — CV Procedure (Signed)
    Cardiac Catheterization Procedure Note  Name: Ellen Peterson MRN: [id number] DOB: [birth year]  Procedure: Left Heart Cath, Selective Coronary Angiography  Indication: 80 yo WF admitted with NSTEMI and CHF. Ecg shows a LBBB.   Procedural Details: The right wrist was prepped, draped, and anesthetized with 1% lidocaine. Using the modified Seldinger technique, a 6 French slender sheath was introduced into the right radial artery. 3 mg of verapamil was administered through the sheath, weight-based unfractionated heparin was administered intravenously. Standard Judkins catheters were used for selective coronary angiography and left ventricular pressure measurement.. Catheter exchanges were performed over an exchange length guidewire. There were no immediate procedural complications. A TR band was used for radial hemostasis at the completion of the procedure.  The patient was transferred to the post catheterization recovery area for further monitoring. 30 cc of contrast was used.  Procedural Findings: Hemodynamics: AO 140/58 mean 93 mm Hg LV 141/4 mm Hg  Coronary angiography: Coronary dominance: left  Left mainstem: Normal  Left anterior descending (LAD): There are diffuse irregularities in the mid LAD up to 20-30%.  Left circumflex (LCx): Normal.  Right coronary artery (RCA): Normal, nondominant.  Left ventriculography: Not done  Final Conclusions:   1. Mild nonobstructive CAD 2. Low LV filling pressures.  Recommendations: Check Echo to assess LV function. Hold diuretics and hydrate. Follow up BMET in am.  Peter Martinique, [place]  06/12/2014, 8:00 AM

## 2014-06-12 NOTE — Progress Notes (Signed)
Patient ambulated to bathroom with two assist, gait belt, and walker with moderate shortness of breath.  Right radial site level 0.  Vital signs stable.  Patient and family deny any questions or concerns at this time.  Will continue to monitor.

## 2014-06-12 NOTE — Interval H&P Note (Signed)
History and Physical Interval Note:  06/12/2014 7:31 AM  [name]  has presented today for surgery, with the diagnosis of CP  The various methods of treatment have been discussed with the patient and family. After consideration of risks, benefits and other options for treatment, the patient has consented to  Procedure(s): LEFT HEART CATHETERIZATION WITH CORONARY ANGIOGRAM (N/A) as a surgical intervention .  The patient's history has been reviewed, patient examined, no change in status, stable for surgery.  I have reviewed the patient's chart and labs.  Questions were answered to the patient's satisfaction.   Cath Lab Visit (complete for each Cath Lab visit)  Clinical Evaluation Leading to the Procedure:   ACS: yes  Non-ACS:    Anginal Classification: CCS IV  Anti-ischemic medical therapy: Maximal Therapy (2 or more classes of medications)  Non-Invasive Test Results: No non-invasive testing performed  Prior CABG: No previous CABG        Collier Salina [facility] 06/12/2014 7:31 AM

## 2014-06-12 NOTE — Progress Notes (Signed)
     SUBJECTIVE: No complaints.   BP 115/63  Pulse 91  Temp(Src) 98.6 F (37 C) (Oral)  Resp 12  Ht 5\' 2"  (1.575 m)  Wt 144 lb 13.5 oz (65.7 kg)  BMI 26.49 kg/m2  SpO2 100%  Intake/Output Summary (Last 24 hours) at 06/12/14 0848 Last data filed at 06/12/14 0700  Gross per 24 hour  Intake 593.08 ml  Output    475 ml  Net 118.08 ml    PHYSICAL EXAM General: Well developed, well nourished, in no acute distress. Alert and oriented x 3.  Psych:  Good affect, responds appropriately Neck: No JVD. No masses noted.  Lungs: Clear bilaterally with no wheezes or rhonci noted.  Heart: RRR with no murmurs noted. Abdomen: Bowel sounds are present. Soft, non-tender.  Extremities: No lower extremity edema.   LABS: Basic Metabolic Panel:  Recent Labs  06/11/14 1109 06/12/14 0229  NA 144 142  K 4.2 4.4  CL 105 102  CO2 22 25  GLUCOSE 132* 128*  BUN 33* 36*  CREATININE 0.87 1.37*  CALCIUM 9.9 9.4   CBC:  Recent Labs  06/11/14 1109 06/12/14 0229  WBC 9.6 10.3  NEUTROABS 7.5  --   HGB 13.4 12.6  HCT 39.9 38.2  MCV 91.5 92.9  PLT 260 277   Cardiac Enzymes:  Recent Labs  06/11/14 2001 06/12/14 0229  CKTOTAL 57 110  CKMB 4.6* 3.7  TROPONINI 0.56* 0.44*   Fasting Lipid Panel:  Recent Labs  06/12/14 0229  CHOL 207*  HDL 64  LDLCALC 123*  TRIG 102  CHOLHDL 3.2    Current Meds: . antiseptic oral rinse  15 mL Mouth Rinse BID  . aspirin EC  81 mg Oral Daily  . cephALEXin  250 mg Oral QHS  . cholecalciferol  1,000 Units Oral Daily  . irbesartan  75 mg Oral Daily  . metoprolol tartrate  25 mg Oral BID  . pantoprazole  40 mg Oral BID  . sodium chloride  3 mL Intravenous Q12H    ASSESSMENT AND PLAN:  1. CAD/NSTEMI/Chest pain: Cardiac cath this am per Dr. Martinique with mild non-obstructive CAD. Continue ASA and beta blocker. Echo today.   2. Acute on chronic systolic CHF: She received IV Lasix yesterday. I/O even. LVEDP is low. Will hold Lasix today.  Recheck creatinine in am.   Transfer to telemetry unit.   MCALHANY,CHRISTOPHER  6/25/20158:48 AM

## 2014-06-13 DIAGNOSIS — I509 Heart failure, unspecified: Secondary | ICD-10-CM

## 2014-06-13 DIAGNOSIS — R072 Precordial pain: Secondary | ICD-10-CM

## 2014-06-13 LAB — BASIC METABOLIC PANEL
BUN: 48 mg/dL — AB (ref 6–23)
CHLORIDE: 105 meq/L (ref 96–112)
CO2: 20 mEq/L (ref 19–32)
Calcium: 8.4 mg/dL (ref 8.4–10.5)
Creatinine, Ser: 1.46 mg/dL — ABNORMAL HIGH (ref 0.50–1.10)
GFR calc Af Amer: 35 mL/min — ABNORMAL LOW (ref >90)
GFR calc non Af Amer: 30 mL/min — ABNORMAL LOW (ref >90)
GLUCOSE: 105 mg/dL — AB (ref 70–99)
Potassium: 3.9 mEq/L (ref 3.7–5.3)
Sodium: 141 mEq/L (ref 137–147)

## 2014-06-13 LAB — CBC
HEMATOCRIT: 35.9 % — AB (ref 36.0–46.0)
HEMOGLOBIN: 11.6 g/dL — AB (ref 12.0–15.0)
MCH: 30.3 pg (ref 26.0–34.0)
MCHC: 32.3 g/dL (ref 30.0–36.0)
MCV: 93.7 fL (ref 78.0–100.0)
Platelets: 227 10*3/uL (ref 150–400)
RBC: 3.83 MIL/uL — ABNORMAL LOW (ref 3.87–5.11)
RDW: 14.6 % (ref 11.5–15.5)
WBC: 9.1 10*3/uL (ref 4.0–10.5)

## 2014-06-13 NOTE — Progress Notes (Signed)
Wrong time please disregard.

## 2014-06-13 NOTE — Progress Notes (Signed)
Patient Name: Ellen Peterson Date of Encounter: 06/13/2014     Principal Problem:   NSTEMI (non-ST elevated myocardial infarction) Active Problems:   PERIPHERAL VASCULAR DISEASE   HYPERGLYCEMIA   LBBB (left bundle branch block)   Hyperlipidemia   HTN (hypertension)    SUBJECTIVE  Mild SOB with exertion overnight, improved since arrival. Denies any significant chest discomfort. Denies any dizziness last night. Family states she had DVT years ago [place], but no recent sign of calf pain. Denies any smoking history, no COPD or emphysema. Denies any symptom of OSA. No recent pneumonia or cough.   CURRENT MEDS . antiseptic oral rinse  15 mL Mouth Rinse BID  . aspirin EC  81 mg Oral Daily  . cephALEXin  250 mg Oral QHS  . cholecalciferol  1,000 Units Oral Daily  . heparin  5,000 Units Subcutaneous 3 times per day  . metoprolol tartrate  25 mg Oral BID  . pantoprazole  40 mg Oral BID    OBJECTIVE  Filed Vitals:   06/12/14 2032 06/12/14 2207 06/13/14 0152 06/13/14 0559  BP: 112/80 107/43 110/72 111/69  Pulse: 75 78 72 70  Temp: 98.2 F (36.8 C)  98 F (36.7 C) 98.4 F (36.9 C)  TempSrc: Oral  Oral Oral  Resp: 18  18 18   Height:      Weight:    150 lb 15.9 oz (68.49 kg)  SpO2: 100%  100% 100%    Intake/Output Summary (Last 24 hours) at 06/13/14 0744 Last data filed at 06/13/14 0724  Gross per 24 hour  Intake 1030.82 ml  Output    250 ml  Net 780.82 ml   Filed Weights   06/12/14 0400 06/12/14 1144 06/13/14 0559  Weight: 144 lb 13.5 oz (65.7 kg) 153 lb 3.5 oz (69.5 kg) 150 lb 15.9 oz (68.49 kg)    PHYSICAL EXAM  General: Pleasant, NAD. Neuro: Alert and oriented X 3. Moves all extremities spontaneously. Psych: Normal affect. HEENT:  Normal  Neck: Supple without bruits or JVD. Lungs:  Resp regular and unlabored, mildly decreased breath sound in all lung, no significant rhonchi, rale or wheezing Heart: RRR no s3, s4, or  murmurs. Abdomen: Soft, non-tender, non-distended, BS + x 4.  Extremities: No clubbing, cyanosis or edema. DP/PT/Radials 2+ and equal bilaterally. R radial cath site stable with no sign of bleeding  Accessory Clinical Findings  CBC  Recent Labs  06/11/14 1109  06/12/14 1305 06/13/14 0525  WBC 9.6  < > 9.7 9.1  NEUTROABS 7.5  --   --   --   HGB 13.4  < > 12.9 11.6*  HCT 39.9  < > 38.9 35.9*  MCV 91.5  < > 93.3 93.7  PLT 260  < > 265 227  < > = values in this interval not displayed. Basic Metabolic Panel  Recent Labs  06/12/14 0229 06/12/14 1305 06/13/14 0525  NA 142  --  141  K 4.4  --  3.9  CL 102  --  105  CO2 25  --  20  GLUCOSE 128*  --  105*  BUN 36*  --  48*  CREATININE 1.37* 1.40* 1.46*  CALCIUM 9.4  --  8.4   Liver Function Tests No results found for this basename: AST, ALT, ALKPHOS, BILITOT, PROT, ALBUMIN,  in the last 72 hours No results found for this basename: LIPASE, AMYLASE,  in the last 72 hours Cardiac Enzymes  Recent Labs  06/11/14 2001 06/12/14 0229  CKTOTAL 57 110  CKMB 4.6* 3.7  TROPONINI 0.56* 0.44*   Fasting Lipid Panel  Recent Labs  06/12/14 0229  CHOL 207*  HDL 64  LDLCALC 123*  TRIG 102  CHOLHDL 3.2    TELE  NSR with HR 80s, LBBB  ECG  NSR with HR 90s, LBBB  Radiology/Studies  Dg Chest Port 1 View  06/11/2014   CLINICAL DATA:  Chest pain.  EXAM: PORTABLE CHEST - 1 VIEW  COMPARISON:  CT chest 08/04/2005.  FINDINGS: Mediastinum unremarkable. Stable mild hilar prominence most likely vascular present. Borderline cardiomegaly. No significant pulmonary venous congestion. No focal pulmonary infiltrate. No pleural effusion or pneumothorax.  IMPRESSION: 1. Stable borderline cardiomegaly. 2. No acute cardiopulmonary disease.   Electronically Signed   By: Marcello Moores  Register   On: 06/11/2014 11:28    ASSESSMENT AND PLAN  1. Chest pain  - Cath 06/12/2014 non-obstructive CAD  2. Pulm HTN - unclear cause  - Echo 06/12/2014 EF  60-65%, no regional wall motion abnormality, mild to moderate dilated RV, PA peak pressure 63 mmHg  - PA pressure higher than expected for age alone, ?if would benefit from outpt PFT to r/o pulmonary disease  - nurse to walk the patient today to see if her O2 sat drops with exertion   3. HTN  4. Hyperlipidemia  5. Acute renal insufficiency  - likely due to diuresis, hold further diuresis as cath shows low filling pressure  6. Chronic LBBB  Signed, Almyra Deforest PA-C Pager: [phone]  As above, patient seen and examined. She denies chest pain or dyspnea this morning. Catheterization reveals no obstructive coronary disease. Her left ventricular end-diastolic pressure was 4. Her renal function is worse today. Hold diuretics. This is most likely related to overdiuresis and she also received dye yesterday. Repeat bmet tomorrow morning. If renal function improving she can be discharged and followup with Dr. Ron Parker. Continue aspirin and metoprolol. Hold ARB. Kirk Ruths

## 2014-06-13 NOTE — Progress Notes (Signed)
Patient evaluated for community based chronic disease management services with [facility] as a benefit of patient's [employer]. Spoke with patient and her four daughters at bedside to explain [facility] services.  Services have been accepted with written consent.  Her daughter [name] is the primary contact.  Patient has a new diagnosis of heart failure.  Family would like a medication review to reduce cost if possible and would like to put personal care aide services in place as patient lives alone.  Provided private duty aide list to [facility] for review.  Patient will receive a post discharge transition of care call and will be evaluated for monthly home visits for assessments and disease process education.  Left contact information and [facility] literature at bedside. Made Inpatient Case Manager aware that [facility] following. Of note, [facility] services does not replace or interfere with any services that are arranged by inpatient case management or social work.  For additional questions or referrals please contact Corliss Blacker BSN RN [facility]iaison at [phone].

## 2014-06-13 NOTE — Clinical Documentation Improvement (Addendum)
Query 1 of 2  Clinical Information:  "Acute Renal Insufficiency likely due to diuresis........." progress note 06/13/14  Creatinine on admission (white female)  0.87  Repeat Creatinine 05/12/14   1.37  (.50 mg/dl increase within 24 hours)   Repeat Creatinine 05/13/14    1.46  (0.59 mg/dl increase within 48 hours  Received IV Lasix on admission (currently on hold)  GFR (white female)  57 ml/min on admission down to 30 ml/ml/min in 48 hours   Please document in the progress notes and discharge summary if a condition below provides greater specificity regarding the patient's Acute Renal Insufficiency:   - Acute Kidney Injury 2/2 ...................   - Other Condition   - Unable to Clinically Determine    Query 2 of 2  Admitted with Chest Pain  LHC revealed nonobstructive CAD and low filling pressures  Echo 06/12/14 Ef 60-65% Mild Aortic Regurgitation Right sided Heart Chambers moderately dilated RV Systolic function moderately reduced RV systolic pressure was increased consistent with severe pulmonary hypertension   Please document the Possible, Probable, Suspected or Known cause of the patient's chest pain   In addition, please clarify if the diagnoses of NSTEMI and Acute on Chronic Systolic CHF have been:   - Confirmed and are applicable to this admission   - Ruled Out and not applicable to this admission.    Thank You,  Erling Conte ,RN  BSN CCDS Certified Clinical Documentation Specialist:  [facility]

## 2014-06-13 NOTE — Progress Notes (Signed)
SATURATION QUALIFICATIONS: (This note is used to comply with regulatory documentation for home oxygen)  Patient Saturations on Room Air at Rest =97%  Patient Saturations on Room Air while Ambulating79%  Patient Saturations on 2 Liters of oxygen while Ambulating = 97 %  Please briefly explain why patient needs home oxygen: desats on ambulation recovered with O2 l 2l/m [place]

## 2014-06-14 DIAGNOSIS — R079 Chest pain, unspecified: Secondary | ICD-10-CM

## 2014-06-14 DIAGNOSIS — N259 Disorder resulting from impaired renal tubular function, unspecified: Secondary | ICD-10-CM

## 2014-06-14 DIAGNOSIS — E785 Hyperlipidemia, unspecified: Secondary | ICD-10-CM

## 2014-06-14 DIAGNOSIS — I2789 Other specified pulmonary heart diseases: Secondary | ICD-10-CM

## 2014-06-14 LAB — BASIC METABOLIC PANEL
BUN: 43 mg/dL — AB (ref 6–23)
CHLORIDE: 105 meq/L (ref 96–112)
CO2: 19 mEq/L (ref 19–32)
Calcium: 8.7 mg/dL (ref 8.4–10.5)
Creatinine, Ser: 1.07 mg/dL (ref 0.50–1.10)
GFR calc non Af Amer: 44 mL/min — ABNORMAL LOW (ref >90)
GFR, EST AFRICAN AMERICAN: 51 mL/min — AB (ref >90)
Glucose, Bld: 110 mg/dL — ABNORMAL HIGH (ref 70–99)
POTASSIUM: 4.1 meq/L (ref 3.7–5.3)
Sodium: 140 mEq/L (ref 137–147)

## 2014-06-14 LAB — CBC
HCT: 34.4 % — ABNORMAL LOW (ref 36.0–46.0)
Hemoglobin: 11.1 g/dL — ABNORMAL LOW (ref 12.0–15.0)
MCH: 30 pg (ref 26.0–34.0)
MCHC: 32.3 g/dL (ref 30.0–36.0)
MCV: 93 fL (ref 78.0–100.0)
PLATELETS: 222 10*3/uL (ref 150–400)
RBC: 3.7 MIL/uL — AB (ref 3.87–5.11)
RDW: 14.3 % (ref 11.5–15.5)
WBC: 8.7 10*3/uL (ref 4.0–10.5)

## 2014-06-14 NOTE — Progress Notes (Signed)
The patient was seen and examined, and I agree with the assessment and plan as documented by K. Lawrence NP, with modifications as noted below. Pt admitted with NSTEMI and found to have nonobstructive CAD, elevated pulmonary pressures, and normal LV systolic function. Renal function has improved. Pt is symptomatically stable. Has daughter who lives across the street and two grandsons who live nearby. She will be discharged today.

## 2014-06-14 NOTE — Progress Notes (Signed)
1350 Discharge instructions given to pt and children . Verbalized understanding.Wheeled to lobby by NT with O2  2l l/mnc

## 2014-06-14 NOTE — Discharge Summary (Signed)
Physician Discharge Summary  Patient ID: Ellen Peterson MRN: [id number] DOB/AGE: 25-Jul-1920 25 y.o.  Admit date: 06/11/2014 Discharge date: 06/14/2014  Primary Discharge Diagnosis 1.CAD with chest pain:      S/P Cath non-obstructive      Preserved LV fx   Secondary Discharge Diagnosis 1. Acute renal insufficiency: Secondary to diuretics and contrast dye 2. Pulmonary Hypertension: Elevated PA pressure 63 mmHg 3. Chronic LBBB 4. Hypertension  Primary Cardiologist: Ron Parker  Significant Diagnostic Studies: 1. Cardiac Cath 06/12/2014 Procedural Findings:  Hemodynamics:  AO 140/58 mean 93 mm Hg  LV 141/4 mm Hg  Coronary angiography:  Coronary dominance: left  Left mainstem: Normal  Left anterior descending (LAD): There are diffuse irregularities in the mid LAD up to 20-30%.  Left circumflex (LCx): Normal.  Right coronary artery (RCA): Normal, nondominant.  Left ventriculography: Not done  Final Conclusions:  1. Mild nonobstructive CAD  2. Low LV filling pressures.  2. Echocardiogram 06/12/2014 Left ventricle: The cavity size was normal. Systolic function was normal. The estimated ejection fraction was in the range of 60% to 65%. Wall motion was normal; there were no regional wall motion abnormalities. - Ventricular septum: Septal motion showed mild paradox. These changes are consistent with a left bundle branch block. - Aortic valve: Mildly to moderately calcified annulus. Trileaflet. Mild thickening and calcification, consistent with sclerosis. There was mild regurgitation. - Mitral valve: Calcified annulus. - Right ventricle: The cavity size was mildly to moderately dilated. Wall thickness was normal. Systolic function was moderately reduced. - Right atrium: The atrium was moderately dilated. 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 - Tricuspid valve: There was moderate regurgitation. - Pulmonary  arteries: PA peak pressure: 63 mm Hg (S).   Consults: None  Hospital Course:  Mrs. Ellen Peterson is a 80 year old female patient who presented to the emergency room with chief complaint of chest pain and shortness of breath. She has a long-standing history of hypertension hyperlipidemia diabetes chronic left bundle branch block. He complained of heaviness in her arms, with progression of symptoms over 2-3 days prior to admission. EKG was negative for ACS, she is down a point-of-care elevation troponin 0.35 with subsequent troponin elevation of 0.45. She was also found to have elevated pro BNP of 7029. Chest x-ray however was negative for CHF.  Subsequent cardiac catheterization completed on 06/12/2014 demonstrated mild nonobstructive CAD with low LV filling pressures. Left main was normal LAD had diffuse icoronary artery. LV gram was not completed due to and 2 rregularities with mid LAD up to 23%, normal circumflex normal right coronary artery, no LV gram was completed secondary to renal insufficiency. Is also provided with IV Lasix in the setting of elevated proBNP. As a result of this along with IV contrast, she had mild elevation in the and then up to 1.46, from 1.37 on admission. Lasix was discontinued, with followup creatinine on day of discharge at 1.07. She was not found to be in CHF. Elevated proBNP likely related to pulmonary hypertension.  It was noted during echocardiogram that the patient had elevated PA pressure indicative of pulmonary hypertension. He was recommended the patient have PFTs as an outpatient if this is clinically indicated, and will be addressed by Dr. Ron Parker on followup. She was not  placed on statins as she is intolerant due to myalgias.  Mrs. Ellen Peterson was seen and examined by myself and Dr. Pennelope Bracken on day of discharge and found to be stable to  return home. Followup appointment with Dr. Ron Parker will be completed. She has family members close by, a daughter who lives across street with  other family members nearby.   Discharge Exam: Blood pressure 131/59, pulse 76, temperature 99.2 F (37.3 C), temperature source Oral, resp. rate 18, height 5\' 2"  (1.575 m), weight 150 lb 2.8 oz (68.12 kg), SpO2 100.00%.  Labs:   Lab Results  Component Value Date   WBC 8.7 06/14/2014   HGB 11.1* 06/14/2014   HCT 34.4* 06/14/2014   MCV 93.0 06/14/2014   PLT 222 06/14/2014     Recent Labs Lab 06/14/14 0336  NA 140  K 4.1  CL 105  CO2 19  BUN 43*  CREATININE 1.07  CALCIUM 8.7  GLUCOSE 110*   Lab Results  Component Value Date   CKTOTAL 110 06/12/2014   CKMB 3.7 06/12/2014   TROPONINI 0.44* 06/12/2014    Lab Results  Component Value Date   CHOL 207* 06/12/2014   CHOL 218* 07/31/2013   CHOL 234* 02/04/2013   Lab Results  Component Value Date   HDL 64 06/12/2014   HDL 60.20 07/31/2013   HDL 57.60 02/04/2013   Lab Results  Component Value Date   LDLCALC 123* 06/12/2014   LDLCALC  Value: 79        Total Cholesterol/HDL:CHD Risk Coronary Heart Disease Risk Table                     Men   Women  1/2 Average Risk   3.4   3.3  Average Risk       5.0   4.4  2 X Average Risk   9.6   7.1  3 X Average Risk  23.4   11.0        Use the calculated Patient Ratio above and the CHD Risk Table to determine the patient's CHD Risk.        ATP III CLASSIFICATION (LDL):  <100     mg/dL   Optimal  100-129  mg/dL   Near or Above                    Optimal  130-159  mg/dL   Borderline  160-189  mg/dL   High  >190     mg/dL   Very High 10/16/2009   LDLCALC 89 08/13/2009   Lab Results  Component Value Date   TRIG 102 06/12/2014   TRIG 117.0 07/31/2013   TRIG 121.0 02/04/2013   Lab Results  Component Value Date   CHOLHDL 3.2 06/12/2014   CHOLHDL 4 07/31/2013   CHOLHDL 4 02/04/2013   Lab Results  Component Value Date   LDLDIRECT 149.0 07/31/2013   LDLDIRECT 142.2 02/04/2013   LDLDIRECT 130.0 03/25/2010      Radiology: Dg Chest Port 1 View  06/11/2014   CLINICAL DATA:  Chest pain.  EXAM: PORTABLE CHEST  - 1 VIEW  COMPARISON:  CT chest 08/04/2005.  FINDINGS: Mediastinum unremarkable. Stable mild hilar prominence most likely vascular present. Borderline cardiomegaly. No significant pulmonary venous congestion. No focal pulmonary infiltrate. No pleural effusion or pneumothorax.  IMPRESSION: 1. Stable borderline cardiomegaly. 2. No acute cardiopulmonary disease.   Electronically Signed   By: Marcello Moores  Register   On: 06/11/2014 11:28    EKG: Normal sinus rhythm with left bundle branch block.    FOLLOW UP PLANS AND APPOINTMENTS     Discharge Instructions   Diet - low sodium heart healthy  Complete by:  As directed      Increase activity slowly    Complete by:  As directed             Medication List    STOP taking these medications       hydrochlorothiazide 25 MG tablet  Commonly known as:  HYDRODIURIL      TAKE these medications       acetaminophen 500 MG tablet  Commonly known as:  TYLENOL  Take 500 mg by mouth every 6 (six) hours as needed (pain).     aspirin 81 MG tablet  Take 81 mg by mouth daily.     cephALEXin 250 MG capsule  Commonly known as:  KEFLEX  Take 250 mg by mouth at bedtime. Continuous UTI prevention     cetirizine 10 MG tablet  Commonly known as:  ZYRTEC  Take 5 mg by mouth daily.     cholecalciferol 1000 UNITS tablet  Commonly known as:  VITAMIN D  Take 1,000 Units by mouth daily.     CINNAMON PO  Take 2,000 mg by mouth daily.     darifenacin 7.5 MG 24 hr tablet  Commonly known as:  ENABLEX  Take 7.5 mg by mouth daily.     Fish Oil 1000 MG Caps  Take 1,000 mg by mouth daily.     ICAPS Caps  Take 1 capsule by mouth daily.     isosorbide mononitrate 30 MG 24 hr tablet  Commonly known as:  IMDUR  Take 30 mg by mouth at bedtime.     metoprolol tartrate 25 MG tablet  Commonly known as:  LOPRESSOR  Take 25 mg by mouth 2 (two) times daily.     nitroGLYCERIN 0.4 MG SL tablet  Commonly known as:  NITROSTAT  Place 1 tablet (0.4 mg total)  under the tongue every 5 (five) minutes as needed for chest pain. As directed and as needed.     pantoprazole 40 MG tablet  Commonly known as:  PROTONIX  Take 40 mg by mouth 2 (two) times daily.     traMADol 50 MG tablet  Commonly known as:  ULTRAM  Take 50 mg by mouth every 8 (eight) hours as needed (pain).     valsartan 80 MG tablet  Commonly known as:  DIOVAN  Take 1 tablet (80 mg total) by mouth daily.     vitamin E 400 UNIT capsule  Take 400 Units by mouth daily.     VOLTAREN 1 % Gel  Generic drug:  diclofenac sodium  Apply 2 g topically 2 (two) times daily as needed (knee pain).       Follow-up Information   Follow up with Dola Argyle, MD. (Our office will call you for appointment.)    Specialty:  Cardiology   Contact information:   [address] [place] [address] [phone]         Time spent with patient to include physician time : 40 minutes Signed: Phill Myron. Lawrence NP  06/14/2014, 8:15 AM Co-Sign MD  The patient was seen and examined, and I agree with the assessment and plan as documented by K. Lawrence NP, with modifications as noted below.  Pt admitted with NSTEMI and found to have nonobstructive CAD, elevated pulmonary pressures, and normal LV systolic function. Renal function has improved. Pt is symptomatically stable. Has daughter who lives across the street and two grandsons who live nearby.  She will be discharged today.

## 2014-06-14 NOTE — Progress Notes (Signed)
The patient was seen and examined, and I agree with the assessment and plan as documented by K. Lawrence NP, with modifications as noted below.  Pt admitted with NSTEMI and found to have nonobstructive CAD, elevated pulmonary pressures, and normal LV systolic function. Renal function has improved. Pt is symptomatically stable. Has daughter who lives across the street and two grandsons who live nearby.  She will be discharged today.

## 2014-06-14 NOTE — Progress Notes (Signed)
    Consulting cardiologist: [place], Peter Primary Cardiologist: Cleatis Polka   Subjective:    Tentative about going home. No chest pain.  Objective:   Temp:  [98.2 F (36.8 C)-99.2 F (37.3 C)] 99.2 F (37.3 C) (06/27 0707) Pulse Rate:  [68-80] 76 (06/27 0707) Resp:  [16-18] 18 (06/27 0707) BP: (118-131)/(43-59) 131/59 mmHg (06/27 0707) SpO2:  [97 %-100 %] 100 % (06/27 0707) Weight:  [150 lb 2.8 oz (68.12 kg)] 150 lb 2.8 oz (68.12 kg) (06/27 0707) Last BM Date: 06/13/14  Filed Weights   06/12/14 1144 06/13/14 0559 06/14/14 0707  Weight: 153 lb 3.5 oz (69.5 kg) 150 lb 15.9 oz (68.49 kg) 150 lb 2.8 oz (68.12 kg)    Intake/Output Summary (Last 24 hours) at 06/14/14 0729 Last data filed at 06/13/14 1805  Gross per 24 hour  Intake    720 ml  Output    150 ml  Net    570 ml    Telemetry: NSR  Exam:  General: No acute distress.  HEENT: Conjunctiva and lids normal, oropharynx clear.  Lungs: Clear to auscultation, nonlabored.  Cardiac: No elevated JVP or bruits. RRR, no gallop or rub.   Abdomen: Normoactive bowel sounds, nontender, nondistended.  Extremities: No pitting edema, distal pulses full.  Neuropsychiatric: Alert and oriented x3, affect appropriate.   Lab Results:  Basic Metabolic Panel:  Recent Labs Lab 06/12/14 0229 06/12/14 1305 06/13/14 0525 06/14/14 0336  NA 142  --  141 140  K 4.4  --  3.9 4.1  CL 102  --  105 105  CO2 25  --  20 19  GLUCOSE 128*  --  105* 110*  BUN 36*  --  48* 43*  CREATININE 1.37* 1.40* 1.46* 1.07  CALCIUM 9.4  --  8.4 8.7     CBC:  Recent Labs Lab 06/12/14 1305 06/13/14 0525 06/14/14 0336  WBC 9.7 9.1 8.7  HGB 12.9 11.6* 11.1*  HCT 38.9 35.9* 34.4*  MCV 93.3 93.7 93.0  PLT 265 227 222    Cardiac Enzymes:  Recent Labs Lab 06/11/14 2001 06/12/14 0229  CKTOTAL 57 110  CKMB 4.6* 3.7  TROPONINI 0.56* 0.44*    BNP:  Recent Labs  06/11/14 1109  PROBNP 7029.0*    Coagulation:  Recent  Labs Lab 06/12/14 0229  INR 1.13      Medications:   Scheduled Medications: . antiseptic oral rinse  15 mL Mouth Rinse BID  . aspirin EC  81 mg Oral Daily  . cephALEXin  250 mg Oral QHS  . cholecalciferol  1,000 Units Oral Daily  . heparin  5,000 Units Subcutaneous 3 times per day  . metoprolol tartrate  25 mg Oral BID  . pantoprazole  40 mg Oral BID    Infusions: . sodium chloride Stopped (06/12/14 0533)    PRN Medications: acetaminophen, nitroGLYCERIN, ondansetron (ZOFRAN) IV   Assessment and Plan:   1. NSTEMI: One positive troponin. Cardiac cath demonstrated non-obstructive disease. Medical management only. Continue BB, ASA, intolerant to statins. Follow up with Dr. Ron Parker as OP.  2. Pulmonary Hypertension: PA pressure 63 mmHg. Consider PFT's as OP.   3. Hypertension: Good control. Continue current meds.   4. Acute Renal Insufficiency: Improved with d/c of lasix. Creatinine now 1.07. Will keep off lasix until follow up with Dr. Ron Parker.   Ellen Peterson. Lawrence NP  06/14/2014, 7:29 AM

## 2014-06-15 ENCOUNTER — Inpatient Hospital Stay (HOSPITAL_COMMUNITY)
Admission: EM | Admit: 2014-06-15 | Discharge: 2014-06-18 | Disposition: A | Discharge status: Home Health | Payer: Medicare Other | Source: Home / Self Care | Attending: Internal Medicine | Admitting: Internal Medicine

## 2014-06-15 ENCOUNTER — Emergency Department (HOSPITAL_COMMUNITY): Payer: Medicare Other

## 2014-06-15 ENCOUNTER — Encounter (HOSPITAL_COMMUNITY): Payer: Self-pay | Admitting: Emergency Medicine

## 2014-06-15 DIAGNOSIS — Z7901 Long term (current) use of anticoagulants: Secondary | ICD-10-CM

## 2014-06-15 DIAGNOSIS — Z823 Family history of stroke: Secondary | ICD-10-CM

## 2014-06-15 DIAGNOSIS — I82409 Acute embolism and thrombosis of unspecified deep veins of unspecified lower extremity: Secondary | ICD-10-CM

## 2014-06-15 DIAGNOSIS — D649 Anemia, unspecified: Secondary | ICD-10-CM

## 2014-06-15 DIAGNOSIS — I824Z2 Acute embolism and thrombosis of unspecified deep veins of left distal lower extremity: Secondary | ICD-10-CM

## 2014-06-15 DIAGNOSIS — Z8249 Family history of ischemic heart disease and other diseases of the circulatory system: Secondary | ICD-10-CM

## 2014-06-15 DIAGNOSIS — R079 Chest pain, unspecified: Secondary | ICD-10-CM | POA: Diagnosis present

## 2014-06-15 DIAGNOSIS — I2609 Other pulmonary embolism with acute cor pulmonale: Secondary | ICD-10-CM | POA: Diagnosis present

## 2014-06-15 DIAGNOSIS — K219 Gastro-esophageal reflux disease without esophagitis: Secondary | ICD-10-CM

## 2014-06-15 DIAGNOSIS — I447 Left bundle-branch block, unspecified: Secondary | ICD-10-CM | POA: Diagnosis present

## 2014-06-15 DIAGNOSIS — E785 Hyperlipidemia, unspecified: Secondary | ICD-10-CM | POA: Diagnosis present

## 2014-06-15 DIAGNOSIS — K5730 Diverticulosis of large intestine without perforation or abscess without bleeding: Secondary | ICD-10-CM

## 2014-06-15 DIAGNOSIS — M353 Polymyalgia rheumatica: Secondary | ICD-10-CM

## 2014-06-15 DIAGNOSIS — Z7982 Long term (current) use of aspirin: Secondary | ICD-10-CM

## 2014-06-15 DIAGNOSIS — R9430 Abnormal result of cardiovascular function study, unspecified: Secondary | ICD-10-CM

## 2014-06-15 DIAGNOSIS — R109 Unspecified abdominal pain: Secondary | ICD-10-CM

## 2014-06-15 DIAGNOSIS — N259 Disorder resulting from impaired renal tubular function, unspecified: Secondary | ICD-10-CM

## 2014-06-15 DIAGNOSIS — I10 Essential (primary) hypertension: Secondary | ICD-10-CM

## 2014-06-15 DIAGNOSIS — Z9981 Dependence on supplemental oxygen: Secondary | ICD-10-CM

## 2014-06-15 DIAGNOSIS — B009 Herpesviral infection, unspecified: Secondary | ICD-10-CM

## 2014-06-15 DIAGNOSIS — I872 Venous insufficiency (chronic) (peripheral): Secondary | ICD-10-CM

## 2014-06-15 DIAGNOSIS — E119 Type 2 diabetes mellitus without complications: Secondary | ICD-10-CM

## 2014-06-15 DIAGNOSIS — I2699 Other pulmonary embolism without acute cor pulmonale: Secondary | ICD-10-CM | POA: Diagnosis present

## 2014-06-15 DIAGNOSIS — M1990 Unspecified osteoarthritis, unspecified site: Secondary | ICD-10-CM

## 2014-06-15 DIAGNOSIS — R7309 Other abnormal glucose: Secondary | ICD-10-CM

## 2014-06-15 DIAGNOSIS — M129 Arthropathy, unspecified: Secondary | ICD-10-CM

## 2014-06-15 DIAGNOSIS — R0789 Other chest pain: Secondary | ICD-10-CM

## 2014-06-15 DIAGNOSIS — J9601 Acute respiratory failure with hypoxia: Secondary | ICD-10-CM

## 2014-06-15 DIAGNOSIS — I472 Ventricular tachycardia: Secondary | ICD-10-CM

## 2014-06-15 DIAGNOSIS — J9600 Acute respiratory failure, unspecified whether with hypoxia or hypercapnia: Secondary | ICD-10-CM

## 2014-06-15 DIAGNOSIS — Z86711 Personal history of pulmonary embolism: Secondary | ICD-10-CM | POA: Insufficient documentation

## 2014-06-15 DIAGNOSIS — E559 Vitamin D deficiency, unspecified: Secondary | ICD-10-CM

## 2014-06-15 DIAGNOSIS — I739 Peripheral vascular disease, unspecified: Secondary | ICD-10-CM

## 2014-06-15 DIAGNOSIS — R000 Tachycardia, unspecified: Secondary | ICD-10-CM

## 2014-06-15 DIAGNOSIS — I2789 Other specified pulmonary heart diseases: Secondary | ICD-10-CM

## 2014-06-15 DIAGNOSIS — Z0000 Encounter for general adult medical examination without abnormal findings: Secondary | ICD-10-CM

## 2014-06-15 DIAGNOSIS — IMO0002 Reserved for concepts with insufficient information to code with codable children: Secondary | ICD-10-CM

## 2014-06-15 DIAGNOSIS — K589 Irritable bowel syndrome without diarrhea: Secondary | ICD-10-CM

## 2014-06-15 DIAGNOSIS — D126 Benign neoplasm of colon, unspecified: Secondary | ICD-10-CM

## 2014-06-15 DIAGNOSIS — N390 Urinary tract infection, site not specified: Secondary | ICD-10-CM

## 2014-06-15 HISTORY — DX: Shortness of breath: R06.02

## 2014-06-15 HISTORY — DX: Adverse effect of unspecified anesthetic, initial encounter: T41.45XA

## 2014-06-15 HISTORY — DX: Other specified postprocedural states: Z98.890

## 2014-06-15 HISTORY — DX: Other complications of anesthesia, initial encounter: T88.59XA

## 2014-06-15 HISTORY — DX: Nausea with vomiting, unspecified: R11.2

## 2014-06-15 HISTORY — DX: Other pulmonary embolism without acute cor pulmonale: I26.99

## 2014-06-15 LAB — CBC WITH DIFFERENTIAL/PLATELET
Basophils Absolute: 0 10*3/uL (ref 0.0–0.1)
Basophils Relative: 0 % (ref 0–1)
Eosinophils Absolute: 0.2 10*3/uL (ref 0.0–0.7)
Eosinophils Relative: 2 % (ref 0–5)
HCT: 37.4 % (ref 36.0–46.0)
HEMOGLOBIN: 12.3 g/dL (ref 12.0–15.0)
LYMPHS PCT: 11 % — AB (ref 12–46)
Lymphs Abs: 1.2 10*3/uL (ref 0.7–4.0)
MCH: 30.8 pg (ref 26.0–34.0)
MCHC: 32.9 g/dL (ref 30.0–36.0)
MCV: 93.7 fL (ref 78.0–100.0)
Monocytes Absolute: 1 10*3/uL (ref 0.1–1.0)
Monocytes Relative: 10 % (ref 3–12)
NEUTROS ABS: 8.1 10*3/uL — AB (ref 1.7–7.7)
Neutrophils Relative %: 77 % (ref 43–77)
Platelets: 238 10*3/uL (ref 150–400)
RBC: 3.99 MIL/uL (ref 3.87–5.11)
RDW: 14 % (ref 11.5–15.5)
WBC: 10.5 10*3/uL (ref 4.0–10.5)

## 2014-06-15 LAB — I-STAT TROPONIN, ED: Troponin i, poc: 0.07 ng/mL (ref 0.00–0.08)

## 2014-06-15 LAB — I-STAT CHEM 8, ED
BUN: 34 mg/dL — ABNORMAL HIGH (ref 6–23)
CALCIUM ION: 1.22 mmol/L (ref 1.13–1.30)
Chloride: 107 mEq/L (ref 96–112)
Creatinine, Ser: 1.1 mg/dL (ref 0.50–1.10)
Glucose, Bld: 146 mg/dL — ABNORMAL HIGH (ref 70–99)
HCT: 40 % (ref 36.0–46.0)
Hemoglobin: 13.6 g/dL (ref 12.0–15.0)
Potassium: 4 mEq/L (ref 3.7–5.3)
Sodium: 142 mEq/L (ref 137–147)
TCO2: 21 mmol/L (ref 0–100)

## 2014-06-15 LAB — GLUCOSE, CAPILLARY: GLUCOSE-CAPILLARY: 137 mg/dL — AB (ref 70–99)

## 2014-06-15 LAB — D-DIMER, QUANTITATIVE (NOT AT ARMC): D-Dimer, Quant: 12.99 ug/mL-FEU — ABNORMAL HIGH (ref 0.00–0.48)

## 2014-06-15 LAB — PRO B NATRIURETIC PEPTIDE: Pro B Natriuretic peptide (BNP): 2873 pg/mL — ABNORMAL HIGH (ref 0–450)

## 2014-06-15 MED ORDER — INSULIN ASPART 100 UNIT/ML ~~LOC~~ SOLN
0.0000 [IU] | Freq: Every day | SUBCUTANEOUS | Status: DC
Start: 2014-06-15 — End: 2014-06-18

## 2014-06-15 MED ORDER — DARIFENACIN HYDROBROMIDE ER 7.5 MG PO TB24
7.5000 mg | ORAL_TABLET | Freq: Every day | ORAL | Status: DC
  Administered 2014-06-16 – 2014-06-18 (×3): 7.5 mg via ORAL
  Filled 2014-06-15 (×4): qty 1

## 2014-06-15 MED ORDER — VANCOMYCIN HCL IN DEXTROSE 1-5 GM/200ML-% IV SOLN
1000.0000 mg | Freq: Once | INTRAVENOUS | Status: AC
  Administered 2014-06-15: 1000 mg via INTRAVENOUS
  Filled 2014-06-15: qty 200

## 2014-06-15 MED ORDER — HYDROCHLOROTHIAZIDE 25 MG PO TABS
25.0000 mg | ORAL_TABLET | Freq: Every day | ORAL | Status: DC
  Administered 2014-06-15 – 2014-06-18 (×4): 25 mg via ORAL
  Filled 2014-06-15 (×4): qty 1

## 2014-06-15 MED ORDER — INSULIN ASPART 100 UNIT/ML ~~LOC~~ SOLN
0.0000 [IU] | Freq: Three times a day (TID) | SUBCUTANEOUS | Status: DC
  Administered 2014-06-16: 1 [IU] via SUBCUTANEOUS

## 2014-06-15 MED ORDER — ACETAMINOPHEN 500 MG PO TABS: 500.0000 mg | ORAL_TABLET | Freq: Four times a day (QID) | ORAL | Status: DC | PRN

## 2014-06-15 MED ORDER — PANTOPRAZOLE SODIUM 40 MG PO TBEC
40.0000 mg | DELAYED_RELEASE_TABLET | Freq: Two times a day (BID) | ORAL | Status: DC
  Administered 2014-06-15 – 2014-06-18 (×6): 40 mg via ORAL
  Filled 2014-06-15 (×6): qty 1

## 2014-06-15 MED ORDER — HEPARIN (PORCINE) IN NACL 100-0.45 UNIT/ML-% IJ SOLN
950.0000 [IU]/h | INTRAMUSCULAR | Status: DC
  Administered 2014-06-15: 1050 [IU]/h via INTRAVENOUS
  Filled 2014-06-15: qty 250

## 2014-06-15 MED ORDER — METOPROLOL TARTRATE 25 MG PO TABS
25.0000 mg | ORAL_TABLET | Freq: Two times a day (BID) | ORAL | Status: DC
  Administered 2014-06-15 – 2014-06-18 (×6): 25 mg via ORAL
  Filled 2014-06-15 (×7): qty 1

## 2014-06-15 MED ORDER — IRBESARTAN 75 MG PO TABS
75.0000 mg | ORAL_TABLET | Freq: Every day | ORAL | Status: DC
Start: 2014-06-15 — End: 2014-06-18
  Administered 2014-06-15 – 2014-06-18 (×4): 75 mg via ORAL
  Filled 2014-06-15 (×4): qty 1

## 2014-06-15 MED ORDER — HEPARIN BOLUS VIA INFUSION
3000.0000 [IU] | Freq: Once | INTRAVENOUS | Status: AC
  Administered 2014-06-15: 3000 [IU] via INTRAVENOUS
  Filled 2014-06-15: qty 3000

## 2014-06-15 MED ORDER — DICLOFENAC SODIUM 1 % TD GEL
2.0000 g | Freq: Two times a day (BID) | TRANSDERMAL | Status: DC | PRN
  Filled 2014-06-15: qty 100

## 2014-06-15 MED ORDER — TRAMADOL HCL 50 MG PO TABS: 50.0000 mg | ORAL_TABLET | Freq: Three times a day (TID) | ORAL | Status: DC | PRN

## 2014-06-15 MED ORDER — SODIUM CHLORIDE 0.9 % IJ SOLN
3.0000 mL | Freq: Two times a day (BID) | INTRAMUSCULAR | Status: DC
  Administered 2014-06-15 – 2014-06-18 (×6): 3 mL via INTRAVENOUS

## 2014-06-15 MED ORDER — ASPIRIN EC 81 MG PO TBEC
81.0000 mg | DELAYED_RELEASE_TABLET | Freq: Every day | ORAL | Status: DC
  Administered 2014-06-15: 81 mg via ORAL
  Filled 2014-06-15 (×2): qty 1

## 2014-06-15 MED ORDER — IOHEXOL 350 MG/ML SOLN
80.0000 mL | Freq: Once | INTRAVENOUS | Status: AC | PRN
  Administered 2014-06-15: 80 mL via INTRAVENOUS

## 2014-06-15 MED ORDER — SODIUM CHLORIDE 0.9 % IV BOLUS (SEPSIS)
500.0000 mL | Freq: Once | INTRAVENOUS | Status: AC
  Administered 2014-06-15: 500 mL via INTRAVENOUS

## 2014-06-15 MED ORDER — ISOSORBIDE MONONITRATE ER 30 MG PO TB24
30.0000 mg | ORAL_TABLET | Freq: Every day | ORAL | Status: DC
  Administered 2014-06-15 – 2014-06-17 (×3): 30 mg via ORAL
  Filled 2014-06-15 (×4): qty 1

## 2014-06-15 MED ORDER — CEPHALEXIN 250 MG PO CAPS
250.0000 mg | ORAL_CAPSULE | Freq: Every day | ORAL | Status: DC
  Administered 2014-06-15 – 2014-06-16 (×2): 250 mg via ORAL
  Filled 2014-06-15 (×4): qty 1

## 2014-06-15 NOTE — ED Provider Notes (Signed)
CSN: [id number]     Arrival date & time 06/15/14  1637 History   First MD Initiated Contact with Patient 06/15/14 1653     Chief Complaint  Patient presents with  . Chest Pain     (Consider location/radiation/quality/duration/timing/severity/associated sxs/prior Treatment) HPI Comments: Pt was recently admitted, discharged yesterday with CP/SOB.  Presents today with same.  Has been having episodes of CP/SOB, tightness to center of chest on ambulation.  Persists for 10-15 minutes after rest.  Was admitted for same.  Had cath 3 days ago showing normal coronaries, low filling pressures, felt to be related to pulmonary hypertension.  Was discharged with home oxygen which she has been using.  Was found to have sats in mid 29s, placed by EMS on NRB mask.  Pt pain free now.  No increased leg swelling or calf tendernes.  No fevers, cough.  Patient is a 80 y.o. female presenting with chest pain.  Chest Pain Associated symptoms: shortness of breath   Associated symptoms: no abdominal pain, no back pain, no cough, no diaphoresis, no dizziness, no fatigue, no fever, no headache, no nausea, no numbness, not vomiting and no weakness     Past Medical History  Diagnosis Date  . Diabetes mellitus     type II  . Diverticulosis of colon   . GERD (gastroesophageal reflux disease)   . Hyperlipidemia   . Hypertension   . Arthritis     OA  . Peripheral vascular disease   . PMR (polymyalgia rheumatica)   . Frequent UTI   . Chest tightness     catheterization, 2005, normal coronaries  . Wide-complex tachycardia     SVT in past  . Hypokalemia   . LBBB (left bundle branch block)     Since at least 2005  . Ejection fraction < 50%     EF 40% echo, 09/2009   Past Surgical History  Procedure Laterality Date  . Appendectomy  1993  . Cholecystectomy    . Colectomy  1993    partial  . Abdominal hysterectomy  1960s    fibroids total  . Vein ligation and stripping    . Bladder tack up    . Back  surgery    . Esophagogastroduodenoscopy  05/2004  . Bilateral pyelonephritis    . Cardiac catheterization  2005    no significant CAD   Family History  Problem Relation Age of Onset  . Heart disease Mother     CAD  . Hypertension Mother   . Stroke Mother   . Cancer Sister     pancreatic CA  . Diabetes Brother    History  Substance Use Topics  . Smoking status: Never Smoker   . Smokeless tobacco: Never Used  . Alcohol Use: No   OB History   Grav Para Term Preterm Abortions TAB SAB Ect Mult Living                 Review of Systems  Constitutional: Negative for fever, chills, diaphoresis and fatigue.  HENT: Negative for congestion, rhinorrhea and sneezing.   Eyes: Negative.   Respiratory: Positive for chest tightness and shortness of breath. Negative for cough.   Cardiovascular: Positive for chest pain. Negative for leg swelling.  Gastrointestinal: Negative for nausea, vomiting, abdominal pain, diarrhea and blood in stool.  Genitourinary: Negative for frequency, hematuria, flank pain and difficulty urinating.  Musculoskeletal: Negative for arthralgias and back pain.  Skin: Negative for rash.  Neurological: Negative for dizziness, speech difficulty,  weakness, numbness and headaches.      Allergies  Erythromycin; Metformin; Metronidazole; Nitrofurantoin; Nsaids; Rofecoxib; Simvastatin; Sulfamethoxazole-trimethoprim; and Tetracycline  Home Medications   Prior to Admission medications   Medication Sig Start Date End Date Taking? Authorizing Parmvir Boomer  acetaminophen (TYLENOL) 500 MG tablet Take 500 mg by mouth every 6 (six) hours as needed (pain).    Yes Historical Benedict Kue, MD  aspirin 81 MG tablet Take 81 mg by mouth at bedtime.    Yes Historical Sholanda Croson, MD  cephALEXin (KEFLEX) 250 MG capsule Take 250 mg by mouth at bedtime. Continuous UTI prevention   Yes Historical Ahman Dugdale, MD  cetirizine (ZYRTEC) 10 MG tablet Take 10 mg by mouth daily as needed for allergies.    Yes  Historical Markeda Narvaez, MD  cholecalciferol (VITAMIN D) 1000 UNITS tablet Take 1,000 Units by mouth daily.     Yes Historical Pearl Berlinger, MD  CINNAMON PO Take 2,000 mg by mouth daily.    Yes Historical Alahna Dunne, MD  darifenacin (ENABLEX) 7.5 MG 24 hr tablet Take 7.5 mg by mouth daily.     Yes Historical Alakai Macbride, MD  diclofenac sodium (VOLTAREN) 1 % GEL Apply 2 g topically 2 (two) times daily as needed (knee pain).    Yes Historical Wladyslawa Disbro, MD  hydrochlorothiazide (HYDRODIURIL) 25 MG tablet Take 25 mg by mouth daily.   Yes Historical Deyonna Fitzsimmons, MD  isosorbide mononitrate (IMDUR) 30 MG 24 hr tablet Take 30 mg by mouth at bedtime.   Yes Historical Katelen Luepke, MD  metoprolol tartrate (LOPRESSOR) 25 MG tablet Take 25 mg by mouth 2 (two) times daily. 05/30/14  Yes Carlena Bjornstad, MD  Multiple Vitamins-Minerals (ICAPS PO) Take 1 capsule by mouth daily.   Yes Historical Arohi Salvatierra, MD  nitroGLYCERIN (NITROSTAT) 0.4 MG SL tablet Place 1 tablet (0.4 mg total) under the tongue every 5 (five) minutes as needed for chest pain. As directed and as needed. 05/30/14  Yes Carlena Bjornstad, MD  Omega-3 Fatty Acids (FISH OIL) 1000 MG CAPS Take 1,000 mg by mouth daily.   Yes Historical Morrisa Aldaba, MD  pantoprazole (PROTONIX) 40 MG tablet Take 40 mg by mouth 2 (two) times daily.   Yes Historical Nain Rudd, MD  traMADol (ULTRAM) 50 MG tablet Take 50 mg by mouth every 8 (eight) hours as needed (pain).   Yes Historical Amiayah Giebel, MD  trolamine salicylate (ASPERCREME) 10 % cream Apply 1 application topically as needed for muscle pain.   Yes Historical Rehana Uncapher, MD  valsartan (DIOVAN) 80 MG tablet Take 1 tablet (80 mg total) by mouth daily. 05/30/14  Yes Carlena Bjornstad, MD  vitamin E 400 UNIT capsule Take 400 Units by mouth daily.     Yes Historical Treasure Ingrum, MD   BP 143/73  Pulse 101  Temp(Src) 98.2 F (36.8 C) (Oral)  Resp 22  Wt 150 lb (68.04 kg)  SpO2 92% Physical Exam  ED Course  Procedures (including critical care time) Labs  Review Results for orders placed during the hospital encounter of 06/15/14  CBC WITH DIFFERENTIAL      Result Value Ref Range   WBC 10.5  4.0 - 10.5 K/uL   RBC 3.99  3.87 - 5.11 MIL/uL   Hemoglobin 12.3  12.0 - 15.0 g/dL   HCT 37.4  36.0 - 46.0 %   MCV 93.7  78.0 - 100.0 fL   MCH 30.8  26.0 - 34.0 pg   MCHC 32.9  30.0 - 36.0 g/dL   RDW 14.0  11.5 - 15.5 %  Platelets 238  150 - 400 K/uL   Neutrophils Relative % 77  43 - 77 %   Neutro Abs 8.1 (*) 1.7 - 7.7 K/uL   Lymphocytes Relative 11 (*) 12 - 46 %   Lymphs Abs 1.2  0.7 - 4.0 K/uL   Monocytes Relative 10  3 - 12 %   Monocytes Absolute 1.0  0.1 - 1.0 K/uL   Eosinophils Relative 2  0 - 5 %   Eosinophils Absolute 0.2  0.0 - 0.7 K/uL   Basophils Relative 0  0 - 1 %   Basophils Absolute 0.0  0.0 - 0.1 K/uL  PRO B NATRIURETIC PEPTIDE      Result Value Ref Range   Pro B Natriuretic peptide (BNP) 2873.0 (*) 0 - 450 pg/mL  D-DIMER, QUANTITATIVE      Result Value Ref Range   D-Dimer, Quant 12.99 (*) 0.00 - 0.48 ug/mL-FEU  I-STAT CHEM 8, ED      Result Value Ref Range   Sodium 142  137 - 147 mEq/L   Potassium 4.0  3.7 - 5.3 mEq/L   Chloride 107  96 - 112 mEq/L   BUN 34 (*) 6 - 23 mg/dL   Creatinine, Ser 1.10  0.50 - 1.10 mg/dL   Glucose, Bld 146 (*) 70 - 99 mg/dL   Calcium, Ion 1.22  1.13 - 1.30 mmol/L   TCO2 21  0 - 100 mmol/L   Hemoglobin 13.6  12.0 - 15.0 g/dL   HCT 40.0  36.0 - 46.0 %  I-STAT TROPOININ, ED      Result Value Ref Range   Troponin i, poc 0.07  0.00 - 0.08 ng/mL   Comment 3            Dg Chest Port 1 View  06/15/2014   CLINICAL DATA:  Shortness of Breath  EXAM: PORTABLE CHEST - 1 VIEW  COMPARISON:  06/11/2014  FINDINGS: Cardiomediastinal silhouette is stable. Atherosclerotic calcifications of thoracic aorta again noted. No acute infiltrate or pulmonary edema. There is vague nodular density right midlung laterally measures 8 mm. Further correlation with CT scan of the chest is recommended to exclude a lung nodule.   IMPRESSION: No acute infiltrate or pulmonary edema. Vague nodular density right midlung laterally. Further correlation with CT scan of the chest is recommended to exclude a lung nodule.   Electronically Signed   By: Lahoma Crocker M.D.   On: 06/15/2014 18:15   Dg Chest Port 1 View  06/11/2014   CLINICAL DATA:  Chest pain.  EXAM: PORTABLE CHEST - 1 VIEW  COMPARISON:  CT chest 08/04/2005.  FINDINGS: Mediastinum unremarkable. Stable mild hilar prominence most likely vascular present. Borderline cardiomegaly. No significant pulmonary venous congestion. No focal pulmonary infiltrate. No pleural effusion or pneumothorax.  IMPRESSION: 1. Stable borderline cardiomegaly. 2. No acute cardiopulmonary disease.   Electronically Signed   By: [place]   On: 06/11/2014 11:28     Imaging Review Ct Angio Chest Pe W/cm &/or Wo Cm  06/15/2014   CLINICAL DATA:  Hypoxia.  Elevated D-dimer.  EXAM: CT ANGIOGRAPHY CHEST WITH CONTRAST  TECHNIQUE: Multidetector CT imaging of the chest was performed using the standard protocol during bolus administration of intravenous contrast. Multiplanar CT image reconstructions and MIPs were obtained to evaluate the vascular anatomy.  CONTRAST:  45mL OMNIPAQUE IOHEXOL 350 MG/ML SOLN  COMPARISON:  01/17/2005.  FINDINGS: There are multiple, large bilateral occlusive pulmonary emboli. Evidence of right heart strain is identified. The RV/  LV ratio is equal to 1.6. Consistent with right heart strain. The heart size is normal. No pericardial effusion. There are multiple small mediastinal and hilar lymph nodes. No pathologic adenopathy identified. No axillary or supraclavicular adenopathy identified.  There is no pleural effusion. Patchy areas of pneumonitis are identified within the right upper lobe, image 47/series 406. Likely pulmonary infarct. There is no suspicious pulmonary nodule or mass identified.  The trachea appears patent and is midline. Review of the visualized osseous structures is  significant for mild multi level thoracic spondylosis. No aggressive lytic or sclerotic bone lesions identified.  Incidental imaging through the upper abdomen is on unremarkable. The adrenal glands appear normal.  Review of the MIP images confirms the above findings.  IMPRESSION: 1. Positive for acute PE with CTevidence of right heart strain (RV/LV Ratio = 1.6.) consistent with at least submassive (intermediate risk) PE. The presence of right heart strain has been associated with an increased risk of morbidity and mortality. 2. Critical Value/emergent results were called by telephone at the time of interpretation on 06/15/2014 at 8:56 PM to Dr. Threasa Beards BELFI , who verbally acknowledged these results.   Electronically Signed   By: Kerby Moors M.D.   On: 06/15/2014 20:57   Dg Chest Port 1 View  06/15/2014   CLINICAL DATA:  Shortness of Breath  EXAM: PORTABLE CHEST - 1 VIEW  COMPARISON:  06/11/2014  FINDINGS: Cardiomediastinal silhouette is stable. Atherosclerotic calcifications of thoracic aorta again noted. No acute infiltrate or pulmonary edema. There is vague nodular density right midlung laterally measures 8 mm. Further correlation with CT scan of the chest is recommended to exclude a lung nodule.  IMPRESSION: No acute infiltrate or pulmonary edema. Vague nodular density right midlung laterally. Further correlation with CT scan of the chest is recommended to exclude a lung nodule.   Electronically Signed   By: Lahoma Crocker M.D.   On: 06/15/2014 18:15     EKG Interpretation   Date/Time:  Sunday June 15 2014 16:47:04 EDT Ventricular Rate:  107 PR Interval:  179 QRS Duration: 126 QT Interval:  363 QTC Calculation: 484 R Axis:   -94 Text Interpretation:  Sinus tachycardia Atrial premature complexes IVCD,  consider atypical RBBB Inferior infarct, acute (RCA) Probable anteroseptal  infarct, recent Probable RV involvement, suggest recording right  precordial leads similar to prior EKG.  slightly more  prominent st  elevation inferiorly, but elevation was noted on prior EKG,L BBB present,   unchanged Confirmed by BELFI  MD, MELANIE ([id number]) on 06/15/2014 4:59:36 PM      MDM   Final diagnoses:  None    Pt with exertional CP and SOB.  Had recent cath that was clear but echo shows decreased filling pressures and pulmonary HTN.  Pt has hypoxia down to 70s at home today, relatively clear CXR.  Checked d-dimer which is positive.  Will check CTA chest.  Discussed with of contrast with pt and family given that pt had a bump in her creatinine with the cath, but now is back to normal.  Will hydrate.  Also noted that pt has an area of erythema around prior IV site with some slight discharge from puncture wound.  Will start abx, area marked.  Pt with bilateral large PEs.  Heparin started, pt admitted to hospitalist service.   CRITICAL CARE Performed by: BELFI, MELANIE Total critical care time: 60 Critical care time was exclusive of separately billable procedures and treating other patients. Critical care was necessary to treat  or prevent imminent or life-threatening deterioration. Critical care was time spent personally by me on the following activities: development of treatment plan with patient and/or surrogate as well as nursing, discussions with consultants, evaluation of patient's response to treatment, examination of patient, obtaining history from patient or surrogate, ordering and performing treatments and interventions, ordering and review of laboratory studies, ordering and review of radiographic studies, pulse oximetry and re-evaluation of patient's condition.     Malvin Johns, MD 06/15/14 2107

## 2014-06-15 NOTE — H&P (Signed)
[facility] History and Physical  Ellen Peterson:[id number] DOB: 1920/05/15 DOA: 06/15/2014  Referring physician: EDP PCP: [name], MD   Chief Complaint: SOB, chest pain   HPI: Ellen Peterson is a 80 y.o. female who was recently admitted to cardiology and discharged yesterday for chest pain work up who returns to the ED with same symptoms.  Patient has been having episodes of CP/SOB with tightness to center of chest on ambulation.  Persists for 10-15 mins at rest.  During admission had heart cath which was negative, 2d echo showed new severe pulmonary HTN with PA pressure of 65 mmHg.  Please see cardiology's notes for details.  Patient was discharged home and returned today after her symptoms worsened.  She was noted to be hypoxic to 85% on room air.  Work up in ED has demonstrated bilateral PEs (see CT scan) with RV strain pattern.  Review of Systems: Systems reviewed.  As above, otherwise negative  Past Medical History  Diagnosis Date  . Diabetes mellitus     type II  . Diverticulosis of colon   . GERD (gastroesophageal reflux disease)   . Hyperlipidemia   . Hypertension   . Arthritis     OA  . Peripheral vascular disease   . PMR (polymyalgia rheumatica)   . Frequent UTI   . Chest tightness     catheterization, 2005, normal coronaries  . Wide-complex tachycardia     SVT in past  . Hypokalemia   . LBBB (left bundle branch block)     Since at least 2005  . Ejection fraction < 50%     EF 40% echo, 09/2009   Past Surgical History  Procedure Laterality Date  . Appendectomy  1993  . Cholecystectomy    . Colectomy  1993    partial  . Abdominal hysterectomy  1960s    fibroids total  . Vein ligation and stripping    . Bladder tack up    . Back surgery    . Esophagogastroduodenoscopy  05/2004  . Bilateral pyelonephritis    . Cardiac catheterization  2005    no significant CAD   Social History:  reports that she has never smoked. She has never used smokeless  tobacco. She reports that she does not drink alcohol or use illicit drugs.  Allergies  Allergen Reactions  . Erythromycin     REACTION: Rash  . Metformin     REACTION: vomiting and diarrhea  . Metronidazole     REACTION: whelps  . Nitrofurantoin Hives    REACTION: Reaction not know  . Nsaids     REACTION: GI symptoms  . Rofecoxib     REACTION: GI symptoms  . Simvastatin     REACTION: myalgia  . Sulfamethoxazole-Trimethoprim     REACTION: itching  . Tetracycline     REACTION: Rash    Family History  Problem Relation Age of Onset  . Heart disease Mother     CAD  . Hypertension Mother   . Stroke Mother   . Cancer Sister     pancreatic CA  . Diabetes Brother      Prior to Admission medications   Medication Sig Start Date End Date Taking? Authorizing Provider  acetaminophen (TYLENOL) 500 MG tablet Take 500 mg by mouth every 6 (six) hours as needed (pain).    Yes Historical Provider, MD  aspirin 81 MG tablet Take 81 mg by mouth at bedtime.    Yes Historical Provider, MD  cephALEXin (  KEFLEX) 250 MG capsule Take 250 mg by mouth at bedtime. Continuous UTI prevention   Yes Historical Provider, MD  cetirizine (ZYRTEC) 10 MG tablet Take 10 mg by mouth daily as needed for allergies.    Yes Historical Provider, MD  cholecalciferol (VITAMIN D) 1000 UNITS tablet Take 1,000 Units by mouth daily.     Yes Historical Provider, MD  CINNAMON PO Take 2,000 mg by mouth daily.    Yes Historical Provider, MD  darifenacin (ENABLEX) 7.5 MG 24 hr tablet Take 7.5 mg by mouth daily.     Yes Historical Provider, MD  diclofenac sodium (VOLTAREN) 1 % GEL Apply 2 g topically 2 (two) times daily as needed (knee pain).    Yes Historical Provider, MD  hydrochlorothiazide (HYDRODIURIL) 25 MG tablet Take 25 mg by mouth daily.   Yes Historical Provider, MD  isosorbide mononitrate (IMDUR) 30 MG 24 hr tablet Take 30 mg by mouth at bedtime.   Yes Historical Provider, MD  metoprolol tartrate (LOPRESSOR) 25 MG  tablet Take 25 mg by mouth 2 (two) times daily. 05/30/14  Yes Carlena Bjornstad, MD  Multiple Vitamins-Minerals (ICAPS PO) Take 1 capsule by mouth daily.   Yes Historical Provider, MD  nitroGLYCERIN (NITROSTAT) 0.4 MG SL tablet Place 1 tablet (0.4 mg total) under the tongue every 5 (five) minutes as needed for chest pain. As directed and as needed. 05/30/14  Yes Carlena Bjornstad, MD  Omega-3 Fatty Acids (FISH OIL) 1000 MG CAPS Take 1,000 mg by mouth daily.   Yes Historical Provider, MD  pantoprazole (PROTONIX) 40 MG tablet Take 40 mg by mouth 2 (two) times daily.   Yes Historical Provider, MD  traMADol (ULTRAM) 50 MG tablet Take 50 mg by mouth every 8 (eight) hours as needed (pain).   Yes Historical Provider, MD  trolamine salicylate (ASPERCREME) 10 % cream Apply 1 application topically as needed for muscle pain.   Yes Historical Provider, MD  valsartan (DIOVAN) 80 MG tablet Take 1 tablet (80 mg total) by mouth daily. 05/30/14  Yes Carlena Bjornstad, MD  vitamin E 400 UNIT capsule Take 400 Units by mouth daily.     Yes Historical Provider, MD   Physical Exam: Filed Vitals:   06/15/14 2030  BP: 143/73  Pulse: 101  Temp:   Resp: 22    BP 143/73  Pulse 101  Temp(Src) 98.2 F (36.8 C) (Oral)  Resp 22  Wt 68.04 kg (150 lb)  SpO2 92%  General Appearance:    Alert, oriented, no distress, appears stated age  Head:    Normocephalic, atraumatic  Eyes:    PERRL, EOMI, sclera non-icteric        Nose:   Nares without drainage or epistaxis. Mucosa, turbinates normal  Throat:   Moist mucous membranes. Oropharynx without erythema or exudate.  Neck:   Supple. No carotid bruits.  No thyromegaly.  No lymphadenopathy.   Back:     No CVA tenderness, no spinal tenderness  Lungs:     Clear to auscultation bilaterally, without wheezes, rhonchi or rales  Chest wall:    No tenderness to palpitation  Heart:    Regular rate and rhythm without murmurs, gallops, rubs  Abdomen:     Soft, non-tender, nondistended, normal  bowel sounds, no organomegaly  Genitalia:    deferred  Rectal:    deferred  Extremities:   No clubbing, cyanosis or edema.  Pulses:   2+ and symmetric all extremities  Skin:   Skin color,  texture, turgor normal, no rashes or lesions  Lymph nodes:   Cervical, supraclavicular, and axillary nodes normal  Neurologic:   CNII-XII intact. Normal strength, sensation and reflexes      throughout    Labs on Admission:  Basic Metabolic Panel:  Recent Labs Lab 06/11/14 1109 06/12/14 0229 06/12/14 1305 06/13/14 0525 06/14/14 0336 06/15/14 1719  NA 144 142  --  141 140 142  K 4.2 4.4  --  3.9 4.1 4.0  CL 105 102  --  105 105 107  CO2 22 25  --  20 19  --   GLUCOSE 132* 128*  --  105* 110* 146*  BUN 33* 36*  --  48* 43* 34*  CREATININE 0.87 1.37* 1.40* 1.46* 1.07 1.10  CALCIUM 9.9 9.4  --  8.4 8.7  --    Liver Function Tests: No results found for this basename: AST, ALT, ALKPHOS, BILITOT, PROT, ALBUMIN,  in the last 168 hours No results found for this basename: LIPASE, AMYLASE,  in the last 168 hours No results found for this basename: AMMONIA,  in the last 168 hours CBC:  Recent Labs Lab 06/11/14 1109 06/12/14 0229 06/12/14 1305 06/13/14 0525 06/14/14 0336 06/15/14 1708 06/15/14 1719  WBC 9.6 10.3 9.7 9.1 8.7 10.5  --   NEUTROABS 7.5  --   --   --   --  8.1*  --   HGB 13.4 12.6 12.9 11.6* 11.1* 12.3 13.6  HCT 39.9 38.2 38.9 35.9* 34.4* 37.4 40.0  MCV 91.5 92.9 93.3 93.7 93.0 93.7  --   PLT 260 277 265 227 222 238  --    Cardiac Enzymes:  Recent Labs Lab 06/11/14 2001 06/12/14 0229  CKTOTAL 57 110  CKMB 4.6* 3.7  TROPONINI 0.56* 0.44*    BNP (last 3 results)  Recent Labs  06/11/14 1109 06/15/14 1708  PROBNP 7029.0* 2873.0*   CBG:  Recent Labs Lab 06/12/14 0001 06/12/14 0431 06/12/14 1712  GLUCAP 130* 147* 102*    Radiological Exams on Admission: Ct Angio Chest Pe W/cm &/or Wo Cm  06/15/2014   CLINICAL DATA:  Hypoxia.  Elevated D-dimer.  EXAM: CT  ANGIOGRAPHY CHEST WITH CONTRAST  TECHNIQUE: Multidetector CT imaging of the chest was performed using the standard protocol during bolus administration of intravenous contrast. Multiplanar CT image reconstructions and MIPs were obtained to evaluate the vascular anatomy.  CONTRAST:  70mL OMNIPAQUE IOHEXOL 350 MG/ML SOLN  COMPARISON:  01/17/2005.  FINDINGS: There are multiple, large bilateral occlusive pulmonary emboli. Evidence of right heart strain is identified. The RV/ LV ratio is equal to 1.6. Consistent with right heart strain. The heart size is normal. No pericardial effusion. There are multiple small mediastinal and hilar lymph nodes. No pathologic adenopathy identified. No axillary or supraclavicular adenopathy identified.  There is no pleural effusion. Patchy areas of pneumonitis are identified within the right upper lobe, image 47/series 406. Likely pulmonary infarct. There is no suspicious pulmonary nodule or mass identified.  The trachea appears patent and is midline. Review of the visualized osseous structures is significant for mild multi level thoracic spondylosis. No aggressive lytic or sclerotic bone lesions identified.  Incidental imaging through the upper abdomen is on unremarkable. The adrenal glands appear normal.  Review of the MIP images confirms the above findings.  IMPRESSION: 1. Positive for acute PE with CTevidence of right heart strain (RV/LV Ratio = 1.6.) consistent with at least submassive (intermediate risk) PE. The presence of right heart strain has been  associated with an increased risk of morbidity and mortality. 2. Critical Value/emergent results were called by telephone at the time of interpretation on 06/15/2014 at 8:56 PM to Dr. Threasa Beards BELFI , who verbally acknowledged these results.   Electronically Signed   By: Kerby Moors M.D.   On: 06/15/2014 20:57   Dg Chest Port 1 View  06/15/2014   CLINICAL DATA:  Shortness of Breath  EXAM: PORTABLE CHEST - 1 VIEW  COMPARISON:   06/11/2014  FINDINGS: Cardiomediastinal silhouette is stable. Atherosclerotic calcifications of thoracic aorta again noted. No acute infiltrate or pulmonary edema. There is vague nodular density right midlung laterally measures 8 mm. Further correlation with CT scan of the chest is recommended to exclude a lung nodule.  IMPRESSION: No acute infiltrate or pulmonary edema. Vague nodular density right midlung laterally. Further correlation with CT scan of the chest is recommended to exclude a lung nodule.   Electronically Signed   By: Lahoma Crocker M.D.   On: 06/15/2014 18:15    EKG: Independently reviewed.  Assessment/Plan Principal Problem:   Acute pulmonary embolism Active Problems:   Chest pain   Bilateral pulmonary embolism   Acute respiratory failure with hypoxia   Acute cor pulmonale   1. Acute bilateral pulmonary emboli with resulting acute cor pulmonale and acute hypoxic respiratory failure- 1. Heparin gtt per pharm consult 2. No hypotension at this point so will hold off on TPa for now (especially given patients age). 3. Venous duplex BLE to look for source 4. Tele monitor, continuous pulse ox, O2 via [place]. 5. Repeat BMP in am to follow kidney function after IV contrast used for CTA (had slight creatinine bump last admit after heart cath but kidney function returned to normal and was normal today prior to CTA). 6. Expect patient will likely be transitioned to xarelto or coumadin, will leave decision as to which up to day team. 2. HTN - continue current home BP meds   Code Status: Full Code  Family Communication: Family at bedside Disposition Plan: Admit to inpatient   Time spent: 70 min  GARDNER, JARED M. [facility] Pager [phone]  If 7AM-7PM, please contact the day team taking care of the patient [email or web address] Password [facility] 06/15/2014, 9:11 PM

## 2014-06-15 NOTE — ED Notes (Signed)
Pt presents to ED via EMS from home with complaints of chest pain and shortness of breath. EMS placed 22g  PIV rt hand. Pt has been having sob on exertion  Today pt hands and feet became cyanotic while walking and having chest pain and shortness of breath. NRB on pt for 85% sats on room air. 97% with NRB. 2 L [place] sats 90% Pain free on arrival .

## 2014-06-15 NOTE — ED Notes (Signed)
Admitting physician at bedside

## 2014-06-15 NOTE — Progress Notes (Signed)
ANTICOAGULATION CONSULT NOTE - Initial Consult  Pharmacy Consult for Heparin Indication: pulmonary embolus  Allergies  Allergen Reactions  . Erythromycin     REACTION: Rash  . Metformin     REACTION: vomiting and diarrhea  . Metronidazole     REACTION: whelps  . Nitrofurantoin Hives    REACTION: Reaction not know  . Nsaids     REACTION: GI symptoms  . Rofecoxib     REACTION: GI symptoms  . Simvastatin     REACTION: myalgia  . Sulfamethoxazole-Trimethoprim     REACTION: itching  . Tetracycline     REACTION: Rash    Patient Measurements: Weight: 150 lb (68.04 kg) Height: 61 inches IBW: 49.7 kg  Heparin Dosing Weight: 63.9 kg  Vital Signs: Temp: 98.2 F (36.8 C) (06/28 1656) Temp src: Oral (06/28 1656) BP: 134/67 mmHg (06/28 2000) Pulse Rate: 99 (06/28 2000)  Labs:  Recent Labs  06/13/14 0525 06/14/14 0336 06/15/14 1708 06/15/14 1719  HGB 11.6* 11.1* 12.3 13.6  HCT 35.9* 34.4* 37.4 40.0  PLT 227 222 238  --   CREATININE 1.46* 1.07  --  1.10   Medical History: Past Medical History  Diagnosis Date  . Diabetes mellitus     type II  . Diverticulosis of colon   . GERD (gastroesophageal reflux disease)   . Hyperlipidemia   . Hypertension   . Arthritis     OA  . Peripheral vascular disease   . PMR (polymyalgia rheumatica)   . Frequent UTI   . Chest tightness     catheterization, 2005, normal coronaries  . Wide-complex tachycardia     SVT in past  . Hypokalemia   . LBBB (left bundle branch block)     Since at least 2005  . Ejection fraction < 50%     EF 40% echo, 09/2009   Assessment: 80yo female recently admitted for c/o chest pain.  She had a cardiac cath which revealed non-obstructive disease and medical management was planned.  She now returns with c/o chest pain and has an elevated D-Dimer and CT positive for PE with right heart strain.  We have been asked to initiate IV heparin for her.  Her H/H is stable as well as her platelets.  She is  without noted bleeding complications.  She is 68 kg and we will use an adjusted weight of 63kg.    Goal of Therapy:  Heparin level 0.3-0.7 units/ml Monitor platelets by anticoagulation protocol: Yes   Plan:  1.  Will give heparin 3000 units IV bolus x 1 and follow with continuous infusion of 1050 units/hr. 2.  F/U 8 hour heparin level and adjust dose accordingly. 3.  Daily heparin levels/CBC  Rober Minion, PharmD., MS Clinical Pharmacist Pager:  [phone] Thank you for allowing pharmacy to be part of this patients care team. 06/15/2014,8:50 PM

## 2014-06-16 ENCOUNTER — Encounter (HOSPITAL_COMMUNITY): Payer: Self-pay | Admitting: General Practice

## 2014-06-16 DIAGNOSIS — I2699 Other pulmonary embolism without acute cor pulmonale: Secondary | ICD-10-CM

## 2014-06-16 DIAGNOSIS — I824Z9 Acute embolism and thrombosis of unspecified deep veins of unspecified distal lower extremity: Secondary | ICD-10-CM

## 2014-06-16 DIAGNOSIS — I10 Essential (primary) hypertension: Secondary | ICD-10-CM

## 2014-06-16 HISTORY — DX: Other pulmonary embolism without acute cor pulmonale: I26.99

## 2014-06-16 LAB — GLUCOSE, CAPILLARY
Glucose-Capillary: 117 mg/dL — ABNORMAL HIGH (ref 70–99)
Glucose-Capillary: 122 mg/dL — ABNORMAL HIGH (ref 70–99)
Glucose-Capillary: 129 mg/dL — ABNORMAL HIGH (ref 70–99)
Glucose-Capillary: 141 mg/dL — ABNORMAL HIGH (ref 70–99)

## 2014-06-16 LAB — BASIC METABOLIC PANEL
BUN: 31 mg/dL — ABNORMAL HIGH (ref 6–23)
CALCIUM: 8.8 mg/dL (ref 8.4–10.5)
CO2: 19 meq/L (ref 19–32)
Chloride: 107 mEq/L (ref 96–112)
Creatinine, Ser: 0.95 mg/dL (ref 0.50–1.10)
GFR calc Af Amer: 59 mL/min — ABNORMAL LOW (ref >90)
GFR calc non Af Amer: 51 mL/min — ABNORMAL LOW (ref >90)
Glucose, Bld: 127 mg/dL — ABNORMAL HIGH (ref 70–99)
Potassium: 4.2 mEq/L (ref 3.7–5.3)
SODIUM: 142 meq/L (ref 137–147)

## 2014-06-16 LAB — HEPARIN LEVEL (UNFRACTIONATED)
Heparin Unfractionated: 0.9 IU/mL — ABNORMAL HIGH (ref 0.30–0.70)
Heparin Unfractionated: 0.73 IU/mL — ABNORMAL HIGH (ref 0.30–0.70)

## 2014-06-16 LAB — CBC
HCT: 35.9 % — ABNORMAL LOW (ref 36.0–46.0)
HEMOGLOBIN: 11.9 g/dL — AB (ref 12.0–15.0)
MCH: 30.9 pg (ref 26.0–34.0)
MCHC: 33.1 g/dL (ref 30.0–36.0)
MCV: 93.2 fL (ref 78.0–100.0)
Platelets: 214 10*3/uL (ref 150–400)
RBC: 3.85 MIL/uL — ABNORMAL LOW (ref 3.87–5.11)
RDW: 14.3 % (ref 11.5–15.5)
WBC: 8.2 10*3/uL (ref 4.0–10.5)

## 2014-06-16 MED ORDER — RIVAROXABAN 20 MG PO TABS: 20.0000 mg | ORAL_TABLET | Freq: Every day | ORAL | Status: DC

## 2014-06-16 MED ORDER — RIVAROXABAN 15 MG PO TABS
15.0000 mg | ORAL_TABLET | Freq: Two times a day (BID) | ORAL | Status: DC
  Administered 2014-06-16 – 2014-06-18 (×4): 15 mg via ORAL
  Filled 2014-06-16 (×7): qty 1

## 2014-06-16 MED ORDER — RIVAROXABAN (XARELTO) EDUCATION KIT FOR DVT/PE PATIENTS
PACK | Freq: Once | Status: AC
  Administered 2014-06-16: 15:00:00
  Filled 2014-06-16: qty 1

## 2014-06-16 NOTE — Progress Notes (Signed)
[place] for Heparin Indication: pulmonary embolus  Allergies  Allergen Reactions  . Erythromycin     REACTION: Rash  . Metformin     REACTION: vomiting and diarrhea  . Metronidazole     REACTION: whelps  . Nitrofurantoin Hives    REACTION: Reaction not know  . Nsaids     REACTION: GI symptoms  . Rofecoxib     REACTION: GI symptoms  . Simvastatin     REACTION: myalgia  . Sulfamethoxazole-Trimethoprim     REACTION: itching  . Tetracycline     REACTION: Rash    Patient Measurements: Height: 5\' 2"  (157.5 cm) Weight: 144 lb 13.5 oz (65.7 kg) IBW/kg (Calculated) : 50.1  Vital Signs: Temp: 98.2 F (36.8 C) (06/29 0411) Temp src: Oral (06/29 0411) BP: 107/50 mmHg (06/29 0411) Pulse Rate: 82 (06/29 0411)  Labs:  Recent Labs  06/13/14 0525 06/14/14 0336 06/15/14 1708 06/15/14 1719 06/16/14 0351  HGB 11.6* 11.1* 12.3 13.6 11.9*  HCT 35.9* 34.4* 37.4 40.0 35.9*  PLT 227 222 238  --  214  HEPARINUNFRC  --   --   --   --  0.90*  CREATININE 1.46* 1.07  --  1.10  --    Goal of Therapy:  Heparin level 0.3-0.7 units/ml Monitor platelets by anticoagulation protocol: Yes   Plan:  Decrease Heparin 950 units/hr Check heparin level in 8 hours.   Phillis Knack, PharmD, BCPS 06/16/2014,4:39 AM

## 2014-06-16 NOTE — Progress Notes (Signed)
Positive findings of DVT text paged to Dr. Myer Haff, Karren Burly, RN

## 2014-06-16 NOTE — Progress Notes (Signed)
Chart reviewed.   [facility] PROGRESS NOTE  Ellen Peterson HYQ:[id number] DOB: [birth year] DOA: 06/15/2014 PCP: [name], MD  Assessment/Plan:  Principal Problem:   Acute pulmonary embolism Active Problems:   HYPERLIPIDEMIA   Hypertension   Chest pain   Acute respiratory failure with hypoxia   Acute cor pulmonale   Acute deep vein thrombosis (DVT) of distal vein of left lower extremity  Change to xarelto. Continue oxygen. PT eval.  Code Status:  full Family Communication:  Daughters at bedside Disposition Plan:  SNF v. Home with 24 hour supervision  Consultants:    Procedures:     Antibiotics:    HPI/Subjective: Became extremely SOB when walking to BR today.  Objective: Filed Vitals:   06/16/14 1348  BP: 106/42  Pulse: 79  Temp: 97.7 F (36.5 C)  Resp: 20    Intake/Output Summary (Last 24 hours) at 06/16/14 1446 Last data filed at 06/16/14 1349  Gross per 24 hour  Intake    940 ml  Output      0 ml  Net    940 ml   Filed Weights   06/15/14 1656 06/15/14 2139  Weight: 68.04 kg (150 lb) 65.7 kg (144 lb 13.5 oz)    Exam:   General:  Asleep. Arousable. Oriented. Weak appearing  Cardiovascular: RRR without MGR  Respiratory: CTA without WRR  Abdomen: S, NT, ND  Ext:  No CCE, no calf tenderness  Basic Metabolic Panel:  Recent Labs Lab 06/11/14 1109 06/12/14 0229 06/12/14 1305 06/13/14 0525 06/14/14 0336 06/15/14 1719 06/16/14 0351  NA 144 142  --  141 140 142 142  K 4.2 4.4  --  3.9 4.1 4.0 4.2  CL 105 102  --  105 105 107 107  CO2 22 25  --  20 19  --  19  GLUCOSE 132* 128*  --  105* 110* 146* 127*  BUN 33* 36*  --  48* 43* 34* 31*  CREATININE 0.87 1.37* 1.40* 1.46* 1.07 1.10 0.95  CALCIUM 9.9 9.4  --  8.4 8.7  --  8.8   Liver Function Tests: No results found for this basename: AST, ALT, ALKPHOS, BILITOT, PROT, ALBUMIN,  in the last 168 hours No results found for this basename: LIPASE, AMYLASE,  in the last 168  hours No results found for this basename: AMMONIA,  in the last 168 hours CBC:  Recent Labs Lab 06/11/14 1109  06/12/14 1305 06/13/14 0525 06/14/14 0336 06/15/14 1708 06/15/14 1719 06/16/14 0351  WBC 9.6  < > 9.7 9.1 8.7 10.5  --  8.2  NEUTROABS 7.5  --   --   --   --  8.1*  --   --   HGB 13.4  < > 12.9 11.6* 11.1* 12.3 13.6 11.9*  HCT 39.9  < > 38.9 35.9* 34.4* 37.4 40.0 35.9*  MCV 91.5  < > 93.3 93.7 93.0 93.7  --  93.2  PLT 260  < > 265 227 222 238  --  214  < > = values in this interval not displayed. Cardiac Enzymes:  Recent Labs Lab 06/11/14 2001 06/12/14 0229  CKTOTAL 57 110  CKMB 4.6* 3.7  TROPONINI 0.56* 0.44*   BNP (last 3 results)  Recent Labs  06/11/14 1109 06/15/14 1708  PROBNP 7029.0* 2873.0*   CBG:  Recent Labs Lab 06/12/14 0431 06/12/14 1712 06/15/14 2350 06/16/14 0633 06/16/14 1137  GLUCAP 147* 102* 137* 117* 122*    Recent Results (from the past  240 hour(s))  MRSA PCR SCREENING     Status: None   Collection Time    06/11/14  3:48 PM      Result Value Ref Range Status   MRSA by PCR NEGATIVE  NEGATIVE Final   Comment:            The GeneXpert MRSA Assay (FDA     approved for NASAL specimens     only), is one component of a     comprehensive MRSA colonization     surveillance program. It is not     intended to diagnose MRSA     infection nor to guide or     monitor treatment for     MRSA infections.     Studies: Ct Angio Chest Pe W/cm &/or Wo Cm  06/15/2014   CLINICAL DATA:  Hypoxia.  Elevated D-dimer.  EXAM: CT ANGIOGRAPHY CHEST WITH CONTRAST  TECHNIQUE: Multidetector CT imaging of the chest was performed using the standard protocol during bolus administration of intravenous contrast. Multiplanar CT image reconstructions and MIPs were obtained to evaluate the vascular anatomy.  CONTRAST:  100mL OMNIPAQUE IOHEXOL 350 MG/ML SOLN  COMPARISON:  01/17/2005.  FINDINGS: There are multiple, large bilateral occlusive pulmonary emboli.  Evidence of right heart strain is identified. The RV/ LV ratio is equal to 1.6. Consistent with right heart strain. The heart size is normal. No pericardial effusion. There are multiple small mediastinal and hilar lymph nodes. No pathologic adenopathy identified. No axillary or supraclavicular adenopathy identified.  There is no pleural effusion. Patchy areas of pneumonitis are identified within the right upper lobe, image 47/series 406. Likely pulmonary infarct. There is no suspicious pulmonary nodule or mass identified.  The trachea appears patent and is midline. Review of the visualized osseous structures is significant for mild multi level thoracic spondylosis. No aggressive lytic or sclerotic bone lesions identified.  Incidental imaging through the upper abdomen is on unremarkable. The adrenal glands appear normal.  Review of the MIP images confirms the above findings.  IMPRESSION: 1. Positive for acute PE with CTevidence of right heart strain (RV/LV Ratio = 1.6.) consistent with at least submassive (intermediate risk) PE. The presence of right heart strain has been associated with an increased risk of morbidity and mortality. 2. Critical Value/emergent results were called by telephone at the time of interpretation on 06/15/2014 at 8:56 PM to Dr. Threasa Beards BELFI , who verbally acknowledged these results.   Electronically Signed   By: Kerby Moors M.D.   On: 06/15/2014 20:57   Dg Chest Port 1 View  06/15/2014   CLINICAL DATA:  Shortness of Breath  EXAM: PORTABLE CHEST - 1 VIEW  COMPARISON:  06/11/2014  FINDINGS: Cardiomediastinal silhouette is stable. Atherosclerotic calcifications of thoracic aorta again noted. No acute infiltrate or pulmonary edema. There is vague nodular density right midlung laterally measures 8 mm. Further correlation with CT scan of the chest is recommended to exclude a lung nodule.  IMPRESSION: No acute infiltrate or pulmonary edema. Vague nodular density right midlung laterally.  Further correlation with CT scan of the chest is recommended to exclude a lung nodule.   Electronically Signed   By: Lahoma Crocker M.D.   On: 06/15/2014 18:15   Doppler legs: acute dvt on left  Scheduled Meds: . cephALEXin  250 mg Oral QHS  . darifenacin  7.5 mg Oral Daily  . hydrochlorothiazide  25 mg Oral Daily  . insulin aspart  0-5 Units Subcutaneous QHS  . insulin aspart  0-9 Units Subcutaneous TID WC  . irbesartan  75 mg Oral Daily  . isosorbide mononitrate  30 mg Oral QHS  . metoprolol tartrate  25 mg Oral BID  . pantoprazole  40 mg Oral BID  . sodium chloride  3 mL Intravenous Q12H   Continuous Infusions:   Time spent: 35 minutes  [facility] Pager [phone]. If 7PM-7AM, please contact night-coverage at [email or web address], password [facility] 06/16/2014, 2:46 PM  LOS: 1 day

## 2014-06-16 NOTE — Care Management Note (Signed)
    Page 1 of 2   06/18/2014     2:11:44 PM CARE MANAGEMENT NOTE 06/18/2014  Patient:  Ellen Peterson, Ellen Peterson   Account Number:  [id number]  Date Initiated:  06/16/2014  Documentation initiated by:  Doloris Servantes  Subjective/Objective Assessment:   Pt adm on 06/15/14 with PE.  PTA, pt lives alone, but has supportive family.     Action/Plan:   CM referral to check coverage for Xarelto and Eliquis.   Anticipated DC Date:  06/18/2014   Anticipated DC Plan:  SKILLED NURSING FACILITY  In-house referral  Clinical Social Worker      DC Planning Services  CM consult  Medication Assistance      Choice offered to / List presented to:             Status of service:  Completed, signed off Medicare Important Message given?  NA - LOS <3 / Initial given by admissions (If response is "NO", the following Medicare IM given date fields will be blank) Date Medicare IM given:   Medicare IM given by:   Date Additional Medicare IM given:   Additional Medicare IM given by:    Discharge Disposition:  [place]  Per UR Regulation:  Reviewed for med. necessity/level of care/duration of stay  If discussed at [place] of Stay Meetings, dates discussed:    Comments:  06/18/14 [name], RN, BSN [phone] Pt discharged to SNF today, per CSW arrangements.  06/16/14 [name], RN, BSN [phone] MD has chosen Xarelto for therapy; pt given free 30 day trial card.  MD has completed Rx prior authorization    06/16/14 [name], RN, BSN [phone]  per rep at [facility]  xarelto: tier 3; $45.00 per rx filled; auth required [phone]  eliquis: tier 3; $45 per rx filled auth required [phone]  patient can use any retail pharmacy

## 2014-06-16 NOTE — Discharge Instructions (Signed)
Information on my medicine - XARELTO (rivaroxaban)  This medication education was reviewed with me or my healthcare representative as part of my discharge preparation.  The pharmacist that spoke with me during my hospital stay was:  Eudelia Bunch, [place]? Xarelto was prescribed to treat blood clots that may have been found in the veins of your legs (deep vein thrombosis) or in your lungs (pulmonary embolism) and to reduce the risk of them occurring again.  What do you need to know about Xarelto? The starting dose is one 15 mg tablet taken TWICE daily with food for the FIRST 21 DAYS then on (enter date)  07/08/2014  the dose is changed to one 20 mg tablet taken ONCE A DAY with your evening meal.  DO NOT stop taking Xarelto without talking to the health care provider who prescribed the medication.  Refill your prescription for 20 mg tablets before you run out.  After discharge, you should have regular check-up appointments with your healthcare provider that is prescribing your Xarelto.  In the future your dose may need to be changed if your kidney function changes by a significant amount.  What do you do if you miss a dose? If you are taking Xarelto TWICE DAILY and you miss a dose, take it as soon as you remember. You may take two 15 mg tablets (total 30 mg) at the same time then resume your regularly scheduled 15 mg twice daily the next day.  If you are taking Xarelto ONCE DAILY and you miss a dose, take it as soon as you remember on the same day then continue your regularly scheduled once daily regimen the next day. Do not take two doses of Xarelto at the same time.   Important Safety Information Xarelto is a blood thinner medicine that can cause bleeding. You should call your healthcare provider right away if you experience any of the following:   Bleeding from an injury or your nose that does not stop.   Unusual colored urine (red or dark brown) or  unusual colored stools (red or black).   Unusual bruising for unknown reasons.   A serious fall or if you hit your head (even if there is no bleeding).  Some medicines may interact with Xarelto and might increase your risk of bleeding while on Xarelto. To help avoid this, consult your healthcare provider or pharmacist prior to using any new prescription or non-prescription medications, including herbals, vitamins, non-steroidal anti-inflammatory drugs (NSAIDs) and supplements.  This website has more information on Xarelto: [email or web address]

## 2014-06-16 NOTE — Progress Notes (Addendum)
[place] for Heparin Indication: pulmonary embolus  Allergies  Allergen Reactions  . Erythromycin     REACTION: Rash  . Metformin     REACTION: vomiting and diarrhea  . Metronidazole     REACTION: whelps  . Nitrofurantoin Hives    REACTION: Reaction not know  . Nsaids     REACTION: GI symptoms  . Rofecoxib     REACTION: GI symptoms  . Simvastatin     REACTION: myalgia  . Sulfamethoxazole-Trimethoprim     REACTION: itching  . Tetracycline     REACTION: Rash    Patient Measurements: Height: 5' 2"  (157.5 cm) Weight: 144 lb 13.5 oz (65.7 kg) IBW/kg (Calculated) : 50.1 Height: 61 inches IBW: 49.7 kg  Heparin Dosing Weight: 63.9 kg  Vital Signs: Temp: 98.2 F (36.8 C) (06/29 0411) Temp src: Oral (06/29 0411) BP: 107/48 mmHg (06/29 0830) Pulse Rate: 88 (06/29 0830)  Labs:  Recent Labs  06/14/14 0336 06/15/14 1708 06/15/14 1719 06/16/14 0351 06/16/14 1115  HGB 11.1* 12.3 13.6 11.9*  --   HCT 34.4* 37.4 40.0 35.9*  --   PLT 222 238  --  214  --   HEPARINUNFRC  --   --   --  0.90* 0.73*  CREATININE 1.07  --  1.10 0.95  --     Assessment: 80yo female recently admitted for c/o chest pain.  She had a cardiac cath which revealed non-obstructive disease and medical management was planned.  She now returns with c/o chest pain and has an elevated D-Dimer and CT positive for PE with right heart strain.  Pharmacy consulted to dose IV heparin for her.    Her first HL was elevated at 0.95 after a 3000 unit bolus and drip at 1050 units/hr.  The heparin rate was decreased to 950 units/hr and heparin level draw ~ 6.5 hrs after is therapeutic at 0.73.   Level drawn about 1.5 hrs early, so not at steady-state.  Creat clearance is 40 ml/min per C&G equation using TBW.  Her renal function is OK for PE dosing of Xarelto or Eliquis.  CM to investigate co-pays for NOACS.  Baseline INR 1.13 on 06/12/14.    Goal of Therapy:  Heparin level 0.3-0.7  units/ml Monitor platelets by anticoagulation protocol: Yes   Plan:  Continue heparin drip at 950 units/hr Daily heparin levels/CBC F/u plans for oral anticoagulant: NOACS vs coumadin  Eudelia Bunch, Pharm.D. [phone] 06/16/2014 1:00 PM  To transition from UFH to xarelto; Creat cl per Cockcroft-Gault calculated with TBW is 40 ml/min. Plan: xarelto 15 mg po BID x 21 days, followed by dose change to xarelto 20 mg q supper starting July 21st Dc heparin drip at same time first dose of xarelto given Xarelto discharge teaching kit ordered for pt.  Will educate pt and give pt 30 day free card  Eudelia Bunch, Pharm.D. [phone] 06/16/2014 3:03 PM

## 2014-06-16 NOTE — Progress Notes (Signed)
VASCULAR LAB PRELIMINARY  PRELIMINARY  PRELIMINARY  PRELIMINARY  Bilateral lower extremity venous Dopplers completed.    Preliminary report:  There is acute DVT noted in both branches of the left peroneal.    KANADY, CANDACE, RVT 06/16/2014, 3:38 PM

## 2014-06-17 LAB — GLUCOSE, CAPILLARY
GLUCOSE-CAPILLARY: 112 mg/dL — AB (ref 70–99)
GLUCOSE-CAPILLARY: 120 mg/dL — AB (ref 70–99)
Glucose-Capillary: 114 mg/dL — ABNORMAL HIGH (ref 70–99)
Glucose-Capillary: 131 mg/dL — ABNORMAL HIGH (ref 70–99)

## 2014-06-17 NOTE — Progress Notes (Signed)
Rehab Admissions Coordinator Note:  Patient was screened by Cleatrice Burke for appropriateness for an Inpatient Acute Rehab Consult per PT recommendation. At this time, we are recommending [place] vs home with [facility]. AARP Medicare insurance will not approve an inpt rehab admission for this diagnosis.  Cleatrice Burke 06/17/2014, 1:30 PM  I can be reached at [phone].

## 2014-06-17 NOTE — Evaluation (Signed)
Physical Therapy Evaluation Patient Details Name: Ellen Peterson MRN: [id number] DOB: 01-29-1920 Today's Date: 06/17/2014   History of Present Illness  Pt admit with bil PE.  Also with positive LLE DVT.    Clinical Impression  Pt admitted with above. Pt currently with functional limitations due to the deficits listed below (see PT Problem List). Pt will benefit from skilled PT to increase their independence and safety with mobility to allow discharge to the venue listed below.      Follow Up Recommendations CIR;Supervision/Assistance - 24 hour    Equipment Recommendations  Rolling walker with 5" wheels (Needs youth RW)    Recommendations for Other Services Rehab consult     Precautions / Restrictions Precautions Precautions: Fall Restrictions Weight Bearing Restrictions: No      Mobility  Bed Mobility Overal bed mobility: Needs Assistance Bed Mobility: Supine to Sit     Supine to sit: Min assist     General bed mobility comments: Assist for elevation of trunk.    Transfers Overall transfer level: Needs assistance Equipment used: Rolling walker (2 wheeled) Transfers: Sit to/from Stand Sit to Stand: Mod assist;From elevated surface         General transfer comment: Took pt two attempts to stand as first attempt she initiated movement but could not quite get up and PT had to assist her moderately and had to keep hands on her as she has posterior lean.  Had to bring potty to pt as she needed to go and was not walking well enough to ambulate into bathroom.  Pt able to use toilet paper and clean herself.    Ambulation/Gait Ambulation/Gait assistance: Mod assist Ambulation Distance (Feet): 15 Feet Assistive device: Rolling walker (2 wheeled) Gait Pattern/deviations: Decreased step length - right;Decreased step length - left;Step-to pattern;Decreased stride length;Shuffle;Leaning posteriorly;Staggering left;Staggering right;Trunk flexed   Gait velocity interpretation:  Below normal speed for age/gender General Gait Details: Pt loses balance easily.  needede support and guidance throughout walk.  Pt has a lot of difficulty with controlling descent into chair and toilet.  Pt very deconditioned and weak with poor safety awareness.  Daughter reports that pt has been ambulating with flexed posture that has worsened recently.  Desat to 86 % on RA and 90/91 % on 3L with activity.    Stairs            Wheelchair Mobility    Modified Rankin (Stroke Patients Only)       Balance Overall balance assessment: Needs assistance;History of Falls Sitting-balance support: Bilateral upper extremity supported;Feet supported Sitting balance-Leahy Scale: Fair     Standing balance support: Bilateral upper extremity supported;During functional activity Standing balance-Leahy Scale: Poor Standing balance comment: Needs assist to steady as pt with posterior lean.                              Pertinent Vitals/Pain Desat to 86 % on RA and 90/91 % on 3L with activity.  Instructed in incentive spirometer.  Other VSS, no pain.      Home Living Family/patient expects to be discharged to:: Private residence Living Arrangements: Alone Available Help at Discharge: Family;Available PRN/intermittently (can stay for a short time 24 hours. ) Type of Home: House Home Access: Stairs to enter Entrance Stairs-Rails: Right Entrance Stairs-Number of Steps: 2 Home Layout: One level Home Equipment: Walker - 2 wheels;Walker - 4 wheels;Cane - quad;Shower seat;Hand held shower head;Wheelchair - manual (they have a  3N1, has adjustable bed) Additional Comments: Need to call [place] to ensure if youth RW is not already ordered.      Prior Function Level of Independence: Independent with assistive device(s)               Hand Dominance   Dominant Hand: Right    Extremity/Trunk Assessment   Upper Extremity Assessment: Defer to OT evaluation            Lower Extremity Assessment: Generalized weakness      Cervical / Trunk Assessment: Kyphotic  Communication   Communication: No difficulties  Cognition Arousal/Alertness: Awake/alert Behavior During Therapy: WFL for tasks assessed/performed Overall Cognitive Status: Within Functional Limits for tasks assessed                      General Comments      Exercises General Exercises - Lower Extremity Ankle Circles/Pumps: AROM;Both;5 reps;Seated Long Arc Quad: AROM;Both;10 reps;Seated      Assessment/Plan    PT Assessment Patient needs continued PT services  PT Diagnosis Generalized weakness   PT Problem List Decreased activity tolerance;Decreased balance;Decreased mobility;Decreased knowledge of use of DME;Decreased safety awareness;Decreased knowledge of precautions  PT Treatment Interventions DME instruction;Gait training;Functional mobility training;Therapeutic activities;Therapeutic exercise;Balance training;Patient/family education   PT Goals (Current goals can be found in the Care Plan section) Acute Rehab PT Goals Patient Stated Goal: to gohome PT Goal Formulation: With patient Time For Goal Achievement: 07/01/14 Potential to Achieve Goals: Good    Frequency Min 3X/week   Barriers to discharge        Co-evaluation               End of Session Equipment Utilized During Treatment: Gait belt;Oxygen Activity Tolerance: Patient limited by fatigue Patient left: in chair;with call bell/phone within reach;with family/visitor present Nurse Communication: Mobility status         Time: 8032-1224 PT Time Calculation (min): 30 min   Charges:   PT Evaluation $Initial PT Evaluation Tier I: 1 Procedure PT Treatments $Gait Training: 8-22 mins $Self Care/Home Management: 8-22   PT G Codes:          INGOLD,DAWN Jul 05, 2014, 11:32 AM [facility] [phone] (pager)

## 2014-06-17 NOTE — Progress Notes (Signed)
Clinical Social Work Department CLINICAL SOCIAL WORK PLACEMENT NOTE 06/17/2014  Patient:  ASAKO, SALIBA  Account Number:  [id number] Admit date:  06/15/2014  Clinical Social Worker:  Megan Salon  Date/time:  06/17/2014 11:47 AM  Clinical Social Work is seeking post-discharge placement for this patient at the following level of care:   [place]   (*CSW will update this form in Epic as items are completed)   06/17/2014  Patient/family provided with [place] Department of Clinical Social Work's list of facilities offering this level of care within the geographic area requested by the patient (or if unable, by the patient's family).  06/17/2014  Patient/family informed of their freedom to choose among providers that offer the needed level of care, that participate in Medicare, Medicaid or managed care program needed by the patient, have an available bed and are willing to accept the patient.  06/17/2014  Patient/family informed of [facility]' ownership interest in [facility], as well as of the fact that they are under no obligation to receive care at this facility.  PASARR submitted to EDS on 06/17/2014 PASARR number received on 06/17/2014  FL2 transmitted to all facilities in geographic area requested by pt/family on  06/17/2014 FL2 transmitted to all facilities within larger geographic area on   Patient informed that his/her managed care company has contracts with or will negotiate with  certain facilities, including the following:     Patient/family informed of bed offers received:   Patient chooses bed at  Physician recommends and patient chooses bed at    Patient to be transferred to  on   Patient to be transferred to facility by  Patient and family notified of transfer on  Name of family member notified:    The following physician request were entered in Epic:   Additional Comments:  Jeanette Caprice, MSW, [place]

## 2014-06-17 NOTE — Progress Notes (Signed)
[facility] PROGRESS NOTE  Ellen Peterson BDZ:[id number] DOB: 1920/09/29 DOA: 06/15/2014 PCP: [name], MD  Assessment/Plan:  Principal Problem:   Acute pulmonary embolism Active Problems:   HYPERLIPIDEMIA   Hypertension   Chest pain   Acute respiratory failure with hypoxia   Acute cor pulmonale   Acute deep vein thrombosis (DVT) of distal vein of left lower extremity  continue xarelto. Continue oxygen. PT eval recommends CIR  Code Status:  full Family Communication:  Daughters at bedside Disposition Plan:  SNF v. CIR  Consultants:    Procedures:     Antibiotics:    HPI/Subjective: Slightly better today, but still gets winded with exertion. Appetite good  Objective: Filed Vitals:   06/17/14 0617  BP: 133/64  Pulse: 79  Temp: 98.2 F (36.8 C)  Resp: 18    Intake/Output Summary (Last 24 hours) at 06/17/14 1310 Last data filed at 06/17/14 0700  Gross per 24 hour  Intake    240 ml  Output    300 ml  Net    -60 ml   Filed Weights   06/15/14 1656 06/15/14 2139 06/17/14 0617  Weight: 68.04 kg (150 lb) 65.7 kg (144 lb 13.5 oz) 67.132 kg (148 lb)    Exam:   General:  Alert, oriented, comfortable  Cardiovascular: RRR without MGR  Respiratory: CTA without WRR  Abdomen: S, NT, ND  Ext:  No CCE, no calf tenderness  Basic Metabolic Panel:  Recent Labs Lab 06/11/14 1109 06/12/14 0229 06/12/14 1305 06/13/14 0525 06/14/14 0336 06/15/14 1719 06/16/14 0351  NA 144 142  --  141 140 142 142  K 4.2 4.4  --  3.9 4.1 4.0 4.2  CL 105 102  --  105 105 107 107  CO2 22 25  --  20 19  --  19  GLUCOSE 132* 128*  --  105* 110* 146* 127*  BUN 33* 36*  --  48* 43* 34* 31*  CREATININE 0.87 1.37* 1.40* 1.46* 1.07 1.10 0.95  CALCIUM 9.9 9.4  --  8.4 8.7  --  8.8   Liver Function Tests: No results found for this basename: AST, ALT, ALKPHOS, BILITOT, PROT, ALBUMIN,  in the last 168 hours No results found for this basename: LIPASE, AMYLASE,  in the  last 168 hours No results found for this basename: AMMONIA,  in the last 168 hours CBC:  Recent Labs Lab 06/11/14 1109  06/12/14 1305 06/13/14 0525 06/14/14 0336 06/15/14 1708 06/15/14 1719 06/16/14 0351  WBC 9.6  < > 9.7 9.1 8.7 10.5  --  8.2  NEUTROABS 7.5  --   --   --   --  8.1*  --   --   HGB 13.4  < > 12.9 11.6* 11.1* 12.3 13.6 11.9*  HCT 39.9  < > 38.9 35.9* 34.4* 37.4 40.0 35.9*  MCV 91.5  < > 93.3 93.7 93.0 93.7  --  93.2  PLT 260  < > 265 227 222 238  --  214  < > = values in this interval not displayed. Cardiac Enzymes:  Recent Labs Lab 06/11/14 2001 06/12/14 0229  CKTOTAL 57 110  CKMB 4.6* 3.7  TROPONINI 0.56* 0.44*   BNP (last 3 results)  Recent Labs  06/11/14 1109 06/15/14 1708  PROBNP 7029.0* 2873.0*   CBG:  Recent Labs Lab 06/16/14 1137 06/16/14 1600 06/16/14 2104 06/17/14 0630 06/17/14 1117  GLUCAP 122* 129* 141* 114* 112*    Recent Results (from the past 240 hour(s))  MRSA PCR SCREENING     Status: None   Collection Time    06/11/14  3:48 PM      Result Value Ref Range Status   MRSA by PCR NEGATIVE  NEGATIVE Final   Comment:            The GeneXpert MRSA Assay (FDA     approved for NASAL specimens     only), is one component of a     comprehensive MRSA colonization     surveillance program. It is not     intended to diagnose MRSA     infection nor to guide or     monitor treatment for     MRSA infections.     Studies: Ct Angio Chest Pe W/cm &/or Wo Cm  06/15/2014   CLINICAL DATA:  Hypoxia.  Elevated D-dimer.  EXAM: CT ANGIOGRAPHY CHEST WITH CONTRAST  TECHNIQUE: Multidetector CT imaging of the chest was performed using the standard protocol during bolus administration of intravenous contrast. Multiplanar CT image reconstructions and MIPs were obtained to evaluate the vascular anatomy.  CONTRAST:  7mL OMNIPAQUE IOHEXOL 350 MG/ML SOLN  COMPARISON:  01/17/2005.  FINDINGS: There are multiple, large bilateral occlusive pulmonary  emboli. Evidence of right heart strain is identified. The RV/ LV ratio is equal to 1.6. Consistent with right heart strain. The heart size is normal. No pericardial effusion. There are multiple small mediastinal and hilar lymph nodes. No pathologic adenopathy identified. No axillary or supraclavicular adenopathy identified.  There is no pleural effusion. Patchy areas of pneumonitis are identified within the right upper lobe, image 47/series 406. Likely pulmonary infarct. There is no suspicious pulmonary nodule or mass identified.  The trachea appears patent and is midline. Review of the visualized osseous structures is significant for mild multi level thoracic spondylosis. No aggressive lytic or sclerotic bone lesions identified.  Incidental imaging through the upper abdomen is on unremarkable. The adrenal glands appear normal.  Review of the MIP images confirms the above findings.  IMPRESSION: 1. Positive for acute PE with CTevidence of right heart strain (RV/LV Ratio = 1.6.) consistent with at least submassive (intermediate risk) PE. The presence of right heart strain has been associated with an increased risk of morbidity and mortality. 2. Critical Value/emergent results were called by telephone at the time of interpretation on 06/15/2014 at 8:56 PM to Dr. Threasa Beards BELFI , who verbally acknowledged these results.   Electronically Signed   By: Kerby Moors M.D.   On: 06/15/2014 20:57   Dg Chest Port 1 View  06/15/2014   CLINICAL DATA:  Shortness of Breath  EXAM: PORTABLE CHEST - 1 VIEW  COMPARISON:  06/11/2014  FINDINGS: Cardiomediastinal silhouette is stable. Atherosclerotic calcifications of thoracic aorta again noted. No acute infiltrate or pulmonary edema. There is vague nodular density right midlung laterally measures 8 mm. Further correlation with CT scan of the chest is recommended to exclude a lung nodule.  IMPRESSION: No acute infiltrate or pulmonary edema. Vague nodular density right midlung  laterally. Further correlation with CT scan of the chest is recommended to exclude a lung nodule.   Electronically Signed   By: Lahoma Crocker M.D.   On: 06/15/2014 18:15   Doppler legs: acute dvt on left  Scheduled Meds: . cephALEXin  250 mg Oral QHS  . darifenacin  7.5 mg Oral Daily  . hydrochlorothiazide  25 mg Oral Daily  . insulin aspart  0-5 Units Subcutaneous QHS  . insulin aspart  0-9 Units Subcutaneous  TID WC  . irbesartan  75 mg Oral Daily  . isosorbide mononitrate  30 mg Oral QHS  . metoprolol tartrate  25 mg Oral BID  . pantoprazole  40 mg Oral BID  . Rivaroxaban  15 mg Oral BID WC  . [START ON 07/08/2014] Rivaroxaban  20 mg Oral Q supper  . sodium chloride  3 mL Intravenous Q12H   Continuous Infusions:   Time spent: 25 minutes  [facility] Pager [phone]. If 7PM-7AM, please contact night-coverage at [email or web address], password [facility] 06/17/2014, 1:10 PM  LOS: 2 days

## 2014-06-17 NOTE — Progress Notes (Signed)
Clinical Social Work Department BRIEF PSYCHOSOCIAL ASSESSMENT 06/17/2014  Patient:  NOORAH, GIAMMONA     Account Number:  [id number]     Admit date:  06/15/2014  Clinical Social Worker:  Megan Salon  Date/Time:  06/17/2014 11:45 AM  Referred by:  Physician  Date Referred:  06/17/2014 Referred for  SNF Placement   Other Referral:   Interview type:  Other - See comment Other interview type:   CSW spoke to patient and patient's daughters at bedside    PSYCHOSOCIAL DATA Living Status:  ALONE Admitted from facility:   Level of care:   Primary support name:  [name] Primary support relationship to patient:  CHILD, ADULT Degree of support available:   Good    CURRENT CONCERNS Current Concerns  Post-Acute Placement   Other Concerns:    SOCIAL WORK ASSESSMENT / PLAN Clinical Social Worker received referral for SNF placement at d/c. Clinical Social Worker met with patient at bedside to offer support and discuss patient needs at discharge.  CSW introduced self and explained reason for visit. Patient had visitors by bedside, patient's daughters.  CSW explained SNF process and provided SNF packet to patient and family. Patient reported she is agreeable for SNF placement and family prefers [facility] CSW will complete FL2 for MD's signature and will update patient and family when bed offers are received.    CSW remains available for support and to facilitate patient discharge needs once medically ready.   Assessment/plan status:  Psychosocial Support/Ongoing Assessment of Needs Other assessment/ plan:   Information/referral to community resources:   SNF information/CSW information    PATIENT'S/FAMILY'S RESPONSE TO PLAN OF CARE: Patient states she is agreeable to SNF placement        Jeanette Caprice, MSW, [place]

## 2014-06-18 LAB — GLUCOSE, CAPILLARY
Glucose-Capillary: 110 mg/dL — ABNORMAL HIGH (ref 70–99)
Glucose-Capillary: 124 mg/dL — ABNORMAL HIGH (ref 70–99)

## 2014-06-18 MED ORDER — RIVAROXABAN 20 MG PO TABS: 20.0000 mg | ORAL_TABLET | Freq: Every day | ORAL | Status: DC

## 2014-06-18 MED ORDER — RIVAROXABAN 15 MG PO TABS: 15.0000 mg | ORAL_TABLET | Freq: Two times a day (BID) | ORAL | Status: DC

## 2014-06-18 NOTE — Progress Notes (Signed)
Report called to [facility] at [facility], Karren Burly, RN

## 2014-06-18 NOTE — Progress Notes (Signed)
IV and tele removed, pt getting dressed, family to take pt to SNF, packet given to family Rickard Rhymes, RN

## 2014-06-18 NOTE — Progress Notes (Signed)
Physical Therapy Treatment Patient Details Name: Ellen Peterson MRN: [id number] DOB: 05/29/1920 Today's Date: Jun 28, 2014    History of Present Illness Pt admit with bil PE.  Also with positive LLE DVT.      PT Comments    Pt making steady progress.  Follow Up Recommendations  SNF     Equipment Recommendations  Rolling walker with 5" wheels (youth)    Recommendations for Other Services       Precautions / Restrictions Precautions Precautions: Fall    Mobility  Bed Mobility Overal bed mobility: Needs Assistance Bed Mobility: Supine to Sit;Sit to Supine     Supine to sit: Supervision;HOB elevated Sit to supine: Supervision   General bed mobility comments: Incr time but no physical assistance  Transfers Overall transfer level: Needs assistance Equipment used: Rolling walker (2 wheeled) Transfers: Sit to/from Stand Sit to Stand: Mod assist         General transfer comment: Assist to bring hips up.  Ambulation/Gait Ambulation/Gait assistance: Min assist Ambulation Distance (Feet): 60 Feet Assistive device: Rolling walker (2 wheeled) Gait Pattern/deviations: Step-through pattern;Decreased step length - right;Decreased step length - left     General Gait Details: Verbal cues to stand more erect and stay closer to walker.   Stairs            Wheelchair Mobility    Modified Rankin (Stroke Patients Only)       Balance   Sitting-balance support: No upper extremity supported;Feet supported Sitting balance-Leahy Scale: Fair     Standing balance support: Bilateral upper extremity supported Standing balance-Leahy Scale: Poor Standing balance comment: support of walker                    Cognition Arousal/Alertness: Awake/alert Behavior During Therapy: WFL for tasks assessed/performed Overall Cognitive Status: Within Functional Limits for tasks assessed                      Exercises      General Comments        Pertinent  Vitals/Pain Dyspnea 1/4 on 2L O2 with amb    Home Living                      Prior Function            PT Goals (current goals can now be found in the care plan section) Progress towards PT goals: Progressing toward goals    Frequency       PT Plan Discharge plan needs to be updated    Co-evaluation             End of Session Equipment Utilized During Treatment: Gait belt;Oxygen Activity Tolerance: Patient tolerated treatment well Patient left: in bed;with call bell/phone within reach;with family/visitor present     Time: 1114-1130 PT Time Calculation (min): 16 min  Charges:  $Gait Training: 8-22 mins                    G Codes:      Ellen Peterson 2014-06-28, 2:28 PM  [facility] PT [phone]

## 2014-06-18 NOTE — Evaluation (Signed)
Occupational Therapy Evaluation Patient Details Name: Ellen Peterson MRN: [id number] DOB: November 26, 1920 Today's Date: 06/18/2014    History of Present Illness Pt admit with bil PE.  Also with positive LLE DVT.     Clinical Impression   PT admitted with BIL PE and LLE DVT. Pt currently with functional limitiations due to the deficits listed below (see OT problem list).  Pt will benefit from skilled OT to increase their independence and safety with adls and balance to allow discharge SNF. Pt plans to d/c to [facility] place. ot to follow acutely for adl retraining with Ec techniques     Follow Up Recommendations  SNF    Equipment Recommendations  Other (comment) (defer SNF)    Recommendations for Other Services       Precautions / Restrictions Precautions Precautions: Fall Precaution Comments: watch oxygen levels Restrictions Weight Bearing Restrictions: No      Mobility Bed Mobility Overal bed mobility: Needs Assistance Bed Mobility: Supine to Sit;Sit to Supine     Supine to sit: Min guard;HOB elevated Sit to supine: Min guard;HOB elevated   General bed mobility comments: incr time and use of bed rails  Transfers Overall transfer level: Needs assistance Equipment used: Rolling walker (2 wheeled) Transfers: Sit to/from Stand Sit to Stand: Min assist              Balance Overall balance assessment: Needs assistance Sitting-balance support: Bilateral upper extremity supported;Feet supported Sitting balance-Leahy Scale: Fair     Standing balance support: Bilateral upper extremity supported;During functional activity Standing balance-Leahy Scale: Poor                              ADL Overall ADL's : Needs assistance/impaired Eating/Feeding: Set up;Sitting   Grooming: Set up;Sitting Grooming Details (indicate cue type and reason): Extended time and effort     Lower Body Bathing: Maximal assistance           Toilet Transfer: Minimal  assistance;Ambulation;BSC           Functional mobility during ADLs: Minimal assistance;Rolling walker General ADL Comments: Pt reports SOB. Pt maintained oxygen 96% RA for ambulation ~12 ft. pt 3L oxygen during session with initial transfer. Pt trending downward with mobility so recommend oxygen during adls at this time. Pt and daughter provided [facility] handout and educated on adls using handout.     Vision                     Perception     Praxis      Pertinent Vitals/Pain VSS 3L o2     Hand Dominance Right   Extremity/Trunk Assessment Upper Extremity Assessment Upper Extremity Assessment: Generalized weakness   Lower Extremity Assessment Lower Extremity Assessment: Defer to PT evaluation   Cervical / Trunk Assessment Cervical / Trunk Assessment: Kyphotic   Communication Communication Communication: No difficulties   Cognition Arousal/Alertness: Awake/alert Behavior During Therapy: WFL for tasks assessed/performed Overall Cognitive Status: Within Functional Limits for tasks assessed                     General Comments       Exercises       Shoulder Instructions      Home Living Family/patient expects to be discharged to:: Skilled nursing facility Living Arrangements: Alone Available Help at Discharge: Family;Available PRN/intermittently Type of Home: House Home Access: Stairs to enter [employer] of Steps: 2 Entrance  Stairs-Rails: Right Home Layout: One level     Bathroom Shower/Tub: Occupational psychologist: Standard     Home Equipment: Environmental consultant - 2 wheels;Walker - 4 wheels;Cane - quad;Shower seat;Hand held shower head;Wheelchair - manual;Grab bars - tub/shower   Additional Comments: Need to call [place] to ensure if youth RW is not already ordered.        Prior Functioning/Environment Level of Independence: Independent with assistive device(s)        Comments: pt has a dog named "rex"    OT  Diagnosis: Generalized weakness   OT Problem List: Decreased strength;Decreased activity tolerance;Decreased safety awareness;Decreased knowledge of use of DME or AE;Decreased knowledge of precautions;Cardiopulmonary status limiting activity   OT Treatment/Interventions: Self-care/ADL training;Therapeutic exercise;DME and/or AE instruction;Therapeutic activities;Patient/family education;Balance training    OT Goals(Current goals can be found in the care plan section) Acute Rehab OT Goals Patient Stated Goal: [facility] but its just not her time yet. OT Goal Formulation: With patient/family Time For Goal Achievement: 07/02/14 Potential to Achieve Goals: Good  OT Frequency: Min 2X/week   Barriers to D/C:            Co-evaluation              End of Session Nurse Communication: Mobility status;Precautions  Activity Tolerance: Patient tolerated treatment well Patient left: in bed;with call bell/phone within reach;with family/visitor present   Time: 0853-0920 OT Time Calculation (min): 27 min Charges:  OT General Charges $OT Visit: 1 Procedure OT Evaluation $Initial OT Evaluation Tier I: 1 Procedure OT Treatments $Self Care/Home Management : 8-22 mins G-Codes:    Peri Maris 06/21/14, 9:46 AM Pager: [phone]

## 2014-06-18 NOTE — Progress Notes (Signed)
Clinical Social Work Department CLINICAL SOCIAL WORK PLACEMENT NOTE 06/18/2014  Patient:  Ellen Peterson, Ellen Peterson  Account Number:  [id number] Admit date:  06/15/2014  Clinical Social Worker:  Megan Salon  Date/time:  06/17/2014 11:47 AM  Clinical Social Work is seeking post-discharge placement for this patient at the following level of care:   Combine   (*CSW will update this form in Epic as items are completed)   06/17/2014  Patient/family provided with [place] Department of Clinical Social Work's list of facilities offering this level of care within the geographic area requested by the patient (or if unable, by the patient's family).  06/17/2014  Patient/family informed of their freedom to choose among providers that offer the needed level of care, that participate in Medicare, Medicaid or managed care program needed by the patient, have an available bed and are willing to accept the patient.  06/17/2014  Patient/family informed of [facility]' ownership interest in [facility], as well as of the fact that they are under no obligation to receive care at this facility.  PASARR submitted to EDS on 06/17/2014 PASARR number received on 06/17/2014  FL2 transmitted to all facilities in geographic area requested by pt/family on  06/17/2014 FL2 transmitted to all facilities within larger geographic area on   Patient informed that his/her managed care company has contracts with or will negotiate with  certain facilities, including the following:     Patient/family informed of bed offers received:  06/17/2014 Patient chooses bed at [facility] Physician recommends and patient chooses bed at    Patient to be transferred to [facility] on  06/18/2014 Patient to be transferred to facility by FAMILY Patient and family notified of transfer on 06/18/2014 Name of family member notified:  Daughters  The following physician request were entered in Epic:   Additional  Comments:  Jeanette Caprice, MSW, [place]

## 2014-06-18 NOTE — Discharge Summary (Signed)
Physician Discharge Summary  Ellen Peterson WFU:[id number] DOB: October 29, 1920 DOA: 06/15/2014  PCP: [name], MD  Admit date: 06/15/2014 Discharge date: 06/18/2014  Time spent: greater than 30 minutes  Discharge Diagnoses:  Principal Problem:   Acute pulmonary embolism Active Problems:   HYPERLIPIDEMIA   Hypertension   Acute respiratory failure with hypoxia   Acute cor pulmonale   Acute deep vein thrombosis (DVT) of distal vein of left lower extremity  Discharge Condition: stable  Filed Weights   06/15/14 2139 06/17/14 0617 06/18/14 0503  Weight: 65.7 kg (144 lb 13.5 oz) 67.132 kg (148 lb) 65.6 kg (144 lb 10 oz)    History of present illness:  80 y.o. female who was recently admitted to cardiology and discharged yesterday for chest pain work up who returns to the ED with same symptoms. Patient has been having episodes of CP/SOB with tightness to center of chest on ambulation. Persists for 10-15 mins at rest. During admission had heart cath which was negative, 2d echo showed new severe pulmonary HTN with PA pressure of 65 mmHg. Please see cardiology's notes for details.  Patient was discharged home and returned today after her symptoms worsened. She was noted to be hypoxic to 85% on room air.  Work up in ED has demonstrated bilateral PEs (see CT scan) with RV strain pattern.  Hospital Course:  Patient was admitted to telemetry. Started initially on heparin gtt. Transitioned to xarelto.  Would treat for minimum 6 months.  preauthorization for same was done by myself.  Patient has experienced no bleeding.  Feels less dyspnea, and pain has resolved.  Doppler legs showed acute DVT of LLE.  Has worked with PT, OT and will benefit from short term SNF for conditioning. Recommend continuing oxygen until oxygen sats normalize with activity.   Procedures:  none  Consultations:  none  Discharge Exam: Filed Vitals:   06/18/14 0950  BP: 123/46  Pulse: 72  Temp:   Resp:     General:  smiling. Drinking coffee Cardiovascular: RRR Respiratory: CTA Ext no CCE  Discharge Instructions   Diet - low sodium heart healthy    Complete by:  As directed      Walk with assistance    Complete by:  As directed             Medication List    STOP taking these medications       aspirin 81 MG tablet     cephALEXin 250 MG capsule  Commonly known as:  KEFLEX     CINNAMON PO      TAKE these medications       acetaminophen 500 MG tablet  Commonly known as:  TYLENOL  Take 500 mg by mouth every 6 (six) hours as needed (pain).     cetirizine 10 MG tablet  Commonly known as:  ZYRTEC  Take 10 mg by mouth daily as needed for allergies.     cholecalciferol 1000 UNITS tablet  Commonly known as:  VITAMIN D  Take 1,000 Units by mouth daily.     darifenacin 7.5 MG 24 hr tablet  Commonly known as:  ENABLEX  Take 7.5 mg by mouth daily.     Fish Oil 1000 MG Caps  Take 1,000 mg by mouth daily.     hydrochlorothiazide 25 MG tablet  Commonly known as:  HYDRODIURIL  Take 25 mg by mouth daily.     ICAPS PO  Take 1 capsule by mouth daily.     isosorbide  mononitrate 30 MG 24 hr tablet  Commonly known as:  IMDUR  Take 30 mg by mouth at bedtime.     metoprolol tartrate 25 MG tablet  Commonly known as:  LOPRESSOR  Take 25 mg by mouth 2 (two) times daily.     nitroGLYCERIN 0.4 MG SL tablet  Commonly known as:  NITROSTAT  Place 1 tablet (0.4 mg total) under the tongue every 5 (five) minutes as needed for chest pain. As directed and as needed.     pantoprazole 40 MG tablet  Commonly known as:  PROTONIX  Take 40 mg by mouth 2 (two) times daily.     Rivaroxaban 15 MG Tabs tablet  Commonly known as:  XARELTO  Take 1 tablet (15 mg total) by mouth 2 (two) times daily with a meal. Through 07/07/14, then change to 20 mg once daily     rivaroxaban 20 MG Tabs tablet  Commonly known as:  XARELTO  Take 1 tablet (20 mg total) by mouth daily with supper. Starting 07/08/14  Start  taking on:  07/08/2014     traMADol 50 MG tablet  Commonly known as:  ULTRAM  Take 50 mg by mouth every 8 (eight) hours as needed (pain).     trolamine salicylate 10 % cream  Commonly known as:  ASPERCREME  Apply 1 application topically as needed for muscle pain.     valsartan 80 MG tablet  Commonly known as:  DIOVAN  Take 1 tablet (80 mg total) by mouth daily.     vitamin E 400 UNIT capsule  Take 400 Units by mouth daily.     VOLTAREN 1 % Gel  Generic drug:  diclofenac sodium  Apply 2 g topically 2 (two) times daily as needed (knee pain).       Allergies  Allergen Reactions  . Erythromycin     REACTION: Rash  . Metformin     REACTION: vomiting and diarrhea  . Metronidazole     REACTION: whelps  . Nitrofurantoin Hives    REACTION: Reaction not know  . Nsaids     REACTION: GI symptoms  . Rofecoxib     REACTION: GI symptoms  . Simvastatin     REACTION: myalgia  . Sulfamethoxazole-Trimethoprim     REACTION: itching  . Tetracycline     REACTION: Rash      The results of significant diagnostics from this hospitalization (including imaging, microbiology, ancillary and laboratory) are listed below for reference.    Significant Diagnostic Studies: Ct Angio Chest Pe W/cm &/or Wo Cm  06/15/2014   CLINICAL DATA:  Hypoxia.  Elevated D-dimer.  EXAM: CT ANGIOGRAPHY CHEST WITH CONTRAST  TECHNIQUE: Multidetector CT imaging of the chest was performed using the standard protocol during bolus administration of intravenous contrast. Multiplanar CT image reconstructions and MIPs were obtained to evaluate the vascular anatomy.  CONTRAST:  48mL OMNIPAQUE IOHEXOL 350 MG/ML SOLN  COMPARISON:  01/17/2005.  FINDINGS: There are multiple, large bilateral occlusive pulmonary emboli. Evidence of right heart strain is identified. The RV/ LV ratio is equal to 1.6. Consistent with right heart strain. The heart size is normal. No pericardial effusion. There are multiple small mediastinal and hilar  lymph nodes. No pathologic adenopathy identified. No axillary or supraclavicular adenopathy identified.  There is no pleural effusion. Patchy areas of pneumonitis are identified within the right upper lobe, image 47/series 406. Likely pulmonary infarct. There is no suspicious pulmonary nodule or mass identified.  The trachea appears patent and is midline.  Review of the visualized osseous structures is significant for mild multi level thoracic spondylosis. No aggressive lytic or sclerotic bone lesions identified.  Incidental imaging through the upper abdomen is on unremarkable. The adrenal glands appear normal.  Review of the MIP images confirms the above findings.  IMPRESSION: 1. Positive for acute PE with CTevidence of right heart strain (RV/LV Ratio = 1.6.) consistent with at least submassive (intermediate risk) PE. The presence of right heart strain has been associated with an increased risk of morbidity and mortality. 2. Critical Value/emergent results were called by telephone at the time of interpretation on 06/15/2014 at 8:56 PM to Dr. Threasa Beards BELFI , who verbally acknowledged these results.   Electronically Signed   By: Kerby Moors M.D.   On: 06/15/2014 20:57   Dg Chest Port 1 View  06/15/2014   CLINICAL DATA:  Shortness of Breath  EXAM: PORTABLE CHEST - 1 VIEW  COMPARISON:  06/11/2014  FINDINGS: Cardiomediastinal silhouette is stable. Atherosclerotic calcifications of thoracic aorta again noted. No acute infiltrate or pulmonary edema. There is vague nodular density right midlung laterally measures 8 mm. Further correlation with CT scan of the chest is recommended to exclude a lung nodule.  IMPRESSION: No acute infiltrate or pulmonary edema. Vague nodular density right midlung laterally. Further correlation with CT scan of the chest is recommended to exclude a lung nodule.   Electronically Signed   By: Lahoma Crocker M.D.   On: 06/15/2014 18:15   Dg Chest Port 1 View  06/11/2014   CLINICAL DATA:  Chest  pain.  EXAM: PORTABLE CHEST - 1 VIEW  COMPARISON:  CT chest 08/04/2005.  FINDINGS: Mediastinum unremarkable. Stable mild hilar prominence most likely vascular present. Borderline cardiomegaly. No significant pulmonary venous congestion. No focal pulmonary infiltrate. No pleural effusion or pneumothorax.  IMPRESSION: 1. Stable borderline cardiomegaly. 2. No acute cardiopulmonary disease.   Electronically Signed   By: Marcello Moores  Register   On: 06/11/2014 11:28    Microbiology: Recent Results (from the past 240 hour(s))  MRSA PCR SCREENING     Status: None   Collection Time    06/11/14  3:48 PM      Result Value Ref Range Status   MRSA by PCR NEGATIVE  NEGATIVE Final   Comment:            The GeneXpert MRSA Assay (FDA     approved for NASAL specimens     only), is one component of a     comprehensive MRSA colonization     surveillance program. It is not     intended to diagnose MRSA     infection nor to guide or     monitor treatment for     MRSA infections.     Labs: Basic Metabolic Panel:  Recent Labs Lab 06/12/14 0229 06/12/14 1305 06/13/14 0525 06/14/14 0336 06/15/14 1719 06/16/14 0351  NA 142  --  141 140 142 142  K 4.4  --  3.9 4.1 4.0 4.2  CL 102  --  105 105 107 107  CO2 25  --  20 19  --  19  GLUCOSE 128*  --  105* 110* 146* 127*  BUN 36*  --  48* 43* 34* 31*  CREATININE 1.37* 1.40* 1.46* 1.07 1.10 0.95  CALCIUM 9.4  --  8.4 8.7  --  8.8   Liver Function Tests: No results found for this basename: AST, ALT, ALKPHOS, BILITOT, PROT, ALBUMIN,  in the last 168 hours No results  found for this basename: LIPASE, AMYLASE,  in the last 168 hours No results found for this basename: AMMONIA,  in the last 168 hours CBC:  Recent Labs Lab 06/12/14 1305 06/13/14 0525 06/14/14 0336 06/15/14 1708 06/15/14 1719 06/16/14 0351  WBC 9.7 9.1 8.7 10.5  --  8.2  NEUTROABS  --   --   --  8.1*  --   --   HGB 12.9 11.6* 11.1* 12.3 13.6 11.9*  HCT 38.9 35.9* 34.4* 37.4 40.0 35.9*   MCV 93.3 93.7 93.0 93.7  --  93.2  PLT 265 227 222 238  --  214   Cardiac Enzymes:  Recent Labs Lab 06/11/14 2001 06/12/14 0229  CKTOTAL 57 110  CKMB 4.6* 3.7  TROPONINI 0.56* 0.44*   BNP: BNP (last 3 results)  Recent Labs  06/11/14 1109 06/15/14 1708  PROBNP 7029.0* 2873.0*   CBG:  Recent Labs Lab 06/17/14 0630 06/17/14 1117 06/17/14 1620 06/17/14 2149 06/18/14 1058  GLUCAP 114* 112* 120* 131* 124*    Signed:  Kailani Brass L  [facility] 06/18/2014, 11:21 AM

## 2014-06-18 NOTE — Progress Notes (Signed)
Clinical Social Worker facilitated patient discharge including contacting patient, family and facility to confirm patient discharge plans.  Clinical information faxed to facility and patient agreeable with plan.  Patient's daughters requested to bring patient and brought a portable O2 tank.  RN to call report prior to discharge.  Clinical Social Worker will sign off for now as social work intervention is no longer needed. Please consult [place] again if new need arises.  Jeanette Caprice, MSW, [place]

## 2014-06-19 ENCOUNTER — Encounter: Payer: Self-pay | Admitting: Adult Health

## 2014-06-19 ENCOUNTER — Non-Acute Institutional Stay (SKILLED_NURSING_FACILITY): Payer: Medicare Other | Admitting: Adult Health

## 2014-06-19 DIAGNOSIS — I824Z2 Acute embolism and thrombosis of unspecified deep veins of left distal lower extremity: Secondary | ICD-10-CM

## 2014-06-19 DIAGNOSIS — M1990 Unspecified osteoarthritis, unspecified site: Secondary | ICD-10-CM

## 2014-06-19 DIAGNOSIS — I10 Essential (primary) hypertension: Secondary | ICD-10-CM

## 2014-06-19 DIAGNOSIS — I2699 Other pulmonary embolism without acute cor pulmonale: Secondary | ICD-10-CM

## 2014-06-19 DIAGNOSIS — N39498 Other specified urinary incontinence: Secondary | ICD-10-CM

## 2014-06-19 DIAGNOSIS — R5381 Other malaise: Secondary | ICD-10-CM

## 2014-06-19 DIAGNOSIS — I824Z9 Acute embolism and thrombosis of unspecified deep veins of unspecified distal lower extremity: Secondary | ICD-10-CM

## 2014-06-19 DIAGNOSIS — K219 Gastro-esophageal reflux disease without esophagitis: Secondary | ICD-10-CM

## 2014-06-19 DIAGNOSIS — I209 Angina pectoris, unspecified: Secondary | ICD-10-CM

## 2014-06-19 NOTE — Progress Notes (Signed)
Patient ID: Ellen Peterson, female   DOB: 03/02/1920, 80 y.o.   MRN: [id number]     [facility]  Allergies  Allergen Reactions  . Erythromycin     REACTION: Rash  . Metformin     REACTION: vomiting and diarrhea  . Metronidazole     REACTION: whelps  . Nitrofurantoin Hives    REACTION: Reaction not know  . Nsaids     REACTION: GI symptoms  . Rofecoxib     REACTION: GI symptoms  . Simvastatin     REACTION: myalgia  . Sulfamethoxazole-Trimethoprim     REACTION: itching  . Tetracycline     REACTION: Rash     Chief Complaint  Patient presents with  . Hospitalization Follow-up    HPI:  She has been hospitalized for an acute PE and DVT. She is here for short term rehab. Her goal at this time is to return back home. She is not voicing amy complaints at this time; she states that she is starting to feel better and that her energy is slowly improving.    Past Medical History  Diagnosis Date  . Diabetes mellitus     type II  . Diverticulosis of colon   . GERD (gastroesophageal reflux disease)   . Hyperlipidemia   . Hypertension   . Arthritis     OA  . Peripheral vascular disease   . PMR (polymyalgia rheumatica)   . Frequent UTI   . Chest tightness     catheterization, 2005, normal coronaries  . Wide-complex tachycardia     SVT in past  . Hypokalemia   . LBBB (left bundle branch block)     Since at least 2005  . Ejection fraction < 50%     EF 40% echo, 09/2009  . Complication of anesthesia   . PONV (postoperative nausea and vomiting)   . PE (pulmonary embolism) 06/16/2014  . Shortness of breath   . UTI (urinary tract infection)     HX OF RECURRENT UTI'    Past Surgical History  Procedure Laterality Date  . Appendectomy  1993  . Cholecystectomy    . Colectomy  1993    partial  . Abdominal hysterectomy  1960s    fibroids total  . Vein ligation and stripping    . Bladder tack up    . Back surgery    . Esophagogastroduodenoscopy  05/2004  . Bilateral  pyelonephritis    . Cardiac catheterization  2005    no significant CAD  . Tonsillectomy      VITAL SIGNS BP 127/83  Pulse 78  Ht 5' (1.524 m)  Wt 148 lb (67.132 kg)  BMI 28.90 kg/m2  SpO2 96%   Patient's Medications  New Prescriptions   No medications on file  Previous Medications   ACETAMINOPHEN (TYLENOL) 500 MG TABLET    Take 500 mg by mouth every 6 (six) hours as needed (pain).    CETIRIZINE (ZYRTEC) 10 MG TABLET    Take 10 mg by mouth daily as needed for allergies.    CHOLECALCIFEROL (VITAMIN D) 1000 UNITS TABLET    Take 1,000 Units by mouth daily.     DARIFENACIN (ENABLEX) 7.5 MG 24 HR TABLET    Take 7.5 mg by mouth daily.     DICLOFENAC SODIUM (VOLTAREN) 1 % GEL    Apply 2 g topically 2 (two) times daily as needed (knee pain).    HYDROCHLOROTHIAZIDE (HYDRODIURIL) 25 MG TABLET    Take 25 mg  by mouth daily.   ISOSORBIDE MONONITRATE (IMDUR) 30 MG 24 HR TABLET    Take 30 mg by mouth at bedtime.   METOPROLOL TARTRATE (LOPRESSOR) 25 MG TABLET    Take 25 mg by mouth 2 (two) times daily.   MULTIPLE VITAMINS-MINERALS (ICAPS PO)    Take 1 capsule by mouth daily.   NITROGLYCERIN (NITROSTAT) 0.4 MG SL TABLET    Place 1 tablet (0.4 mg total) under the tongue every 5 (five) minutes as needed for chest pain. As directed and as needed.   OMEGA-3 FATTY ACIDS (FISH OIL) 1000 MG CAPS    Take 1,000 mg by mouth daily.   PANTOPRAZOLE (PROTONIX) 40 MG TABLET    Take 40 mg by mouth 2 (two) times daily.   RIVAROXABAN (XARELTO) 15 MG TABS TABLET    Take 1 tablet (15 mg total) by mouth 2 (two) times daily with a meal. Through 07/07/14, then change to 20 mg once daily   RIVAROXABAN (XARELTO) 20 MG TABS TABLET    Take 1 tablet (20 mg total) by mouth daily with supper. Starting 07/08/14   TRAMADOL (ULTRAM) 50 MG TABLET    Take 50 mg by mouth every 8 (eight) hours as needed (pain).   TROLAMINE SALICYLATE (ASPERCREME) 10 % CREAM    Apply 1 application topically as needed for muscle pain.   VALSARTAN (DIOVAN)  80 MG TABLET    Take 1 tablet (80 mg total) by mouth daily.   VITAMIN E 400 UNIT CAPSULE    Take 400 Units by mouth daily.    Modified Medications   No medications on file  Discontinued Medications   No medications on file    SIGNIFICANT DIAGNOSTIC EXAMS  06-11-14: chest x-ray: 1. Stable borderline cardiomegaly. 2. No acute cardiopulmonary disease.   06-12-14: TEE: Left ventricle: The cavity size was normal. Systolic function was normal. The estimated ejection fraction was in the range of 60% to 65%. Wall motion was normal; there were no regional wall motion abnormalities. - Ventricular septum: Septal motion showed mild paradox. These changes are consistent with a left bundle branch block. - Aortic valve: Mildly to moderately calcified annulus. Trileaflet. Mild thickening and calcification, consistent with sclerosis. There was mild regurgitation. - Mitral valve: Calcified annulus. - Right ventricle: The cavity size was mildly to moderately dilated. Wall thickness was normal. Systolic function was moderately reduced. - Right atrium: The atrium was moderately dilated. 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 - Tricuspid valve: There was moderate regurgitation. - Pulmonary arteries: PA peak pressure: 63 mm Hg (S).  06-13-14: ct angio of chest1. Positive for acute PE with CTevidence of right heart strain (RV/LV Ratio = 1.6.) consistent with at least submassive (intermediate risk) PE. The presence of right heart strain has been associated with an increased risk of morbidity and mortality.  06-15-14: chest x-ray: No acute infiltrate or pulmonary edema. Vague nodular density right midlung laterally. Further correlation with CT scan of the chest is recommended to exclude a lung nodule.   06-16-14: bilateral lower extremity dopplers: Right leg is negative for deep and superficial vein thrombosis. The left leg has ACUTE DVT in the  peroneal veins in the calf.     LABS REVIEWED:   06-11-14: wbc 9.6; hgb 13.4 ;hct 39.9 ;mcv 91.5; plt 260; glucose 132; bun 33; creat 0.87; k+4.2; na++144; BNP 7029 06-12-14: chol 207; ldl 123; trig 102 06-15-14; wbc 10.5; hgb 12.3; hct 37.4; mcv 93.7;  plt 238; BNP 2873; D-dimer: 12.99   .  Review of Systems  Constitutional: Positive for malaise/fatigue.  Respiratory: Positive for shortness of breath. Negative for cough and wheezing.   Cardiovascular: Negative for chest pain, palpitations and leg swelling.  Gastrointestinal: Negative for heartburn, abdominal pain and constipation.  Musculoskeletal: Negative for joint pain and myalgias.  Skin: Negative.   Psychiatric/Behavioral: Negative for depression. The patient is not nervous/anxious.      Physical Exam  Constitutional: She is oriented to person, place, and time. She appears well-developed and well-nourished. No distress.  Neck: Neck supple. No JVD present.  Cardiovascular: Normal rate, regular rhythm and intact distal pulses.   Respiratory: Effort normal. No respiratory distress. She has no wheezes.  Breath sounds slightly diminished a  GI: Soft. Bowel sounds are normal. She exhibits no distension. There is no tenderness.  Musculoskeletal: She exhibits no edema.  Is able to move all extremities   Neurological: She is alert and oriented to person, place, and time.  Skin: Skin is warm and dry. She is not diaphoretic.  Psychiatric: She has a normal mood and affect.      ASSESSMENT/ PLAN:  1. PE/DVT on left lower extremity: will continue her xarelto 15 mg twice daily through 07-07-14 will then being xarelto 20 mg daily and will continue to monitor her status.   2. Hypertension: will continue hctz 25 mg daily; lopressor 25 mg twice daily; diovan 80 mg daily  and will monitor her status   3. Angina: no complaints of chest pain present: will continue imdur 30 mg daily and has ntg prn will monitor her status   4. UI: will  continue enablex 7.5 mg daily   5. Gerd: will continue protonix 40 mg twice daily   6. Osteoarthritis: will continue ultram 50 mg every 8 hours as needed and voltaren gel to both knees twice daily as needed   7. Physical deconditioning: will continue therapy as directed to improve upon her strength; endurance and independence with adl's and will monitor her status.    Time spent with patient 50 minutes.     Ok Edwards NP [facility]  Contact [phone] Monday through Friday 8am- 5pm  After hours call [phone]

## 2014-06-22 DIAGNOSIS — R5381 Other malaise: Secondary | ICD-10-CM | POA: Insufficient documentation

## 2014-06-22 DIAGNOSIS — I10 Essential (primary) hypertension: Secondary | ICD-10-CM | POA: Insufficient documentation

## 2014-06-22 DIAGNOSIS — K219 Gastro-esophageal reflux disease without esophagitis: Secondary | ICD-10-CM | POA: Insufficient documentation

## 2014-06-22 DIAGNOSIS — R32 Unspecified urinary incontinence: Secondary | ICD-10-CM | POA: Insufficient documentation

## 2014-06-23 ENCOUNTER — Non-Acute Institutional Stay (SKILLED_NURSING_FACILITY): Payer: Medicare Other | Admitting: Internal Medicine

## 2014-06-23 ENCOUNTER — Encounter: Payer: Self-pay | Admitting: Internal Medicine

## 2014-06-23 DIAGNOSIS — R32 Unspecified urinary incontinence: Secondary | ICD-10-CM

## 2014-06-23 DIAGNOSIS — R531 Weakness: Secondary | ICD-10-CM

## 2014-06-23 DIAGNOSIS — R5383 Other fatigue: Secondary | ICD-10-CM

## 2014-06-23 DIAGNOSIS — R5381 Other malaise: Secondary | ICD-10-CM

## 2014-06-23 DIAGNOSIS — K219 Gastro-esophageal reflux disease without esophagitis: Secondary | ICD-10-CM

## 2014-06-23 DIAGNOSIS — I2699 Other pulmonary embolism without acute cor pulmonale: Secondary | ICD-10-CM

## 2014-06-23 DIAGNOSIS — I824Z2 Acute embolism and thrombosis of unspecified deep veins of left distal lower extremity: Secondary | ICD-10-CM

## 2014-06-23 DIAGNOSIS — I10 Essential (primary) hypertension: Secondary | ICD-10-CM

## 2014-06-23 DIAGNOSIS — M1990 Unspecified osteoarthritis, unspecified site: Secondary | ICD-10-CM

## 2014-06-23 DIAGNOSIS — I824Z9 Acute embolism and thrombosis of unspecified deep veins of unspecified distal lower extremity: Secondary | ICD-10-CM

## 2014-06-23 NOTE — Progress Notes (Signed)
Patient ID: Ellen Peterson, female   DOB: May 09, 1920, 80 y.o.   MRN: [id number]     Facility: [facility]    PCP: [name], MD  Code Status: dnr  Allergies  Allergen Reactions  . Erythromycin     REACTION: Rash  . Metformin     REACTION: vomiting and diarrhea  . Metronidazole     REACTION: whelps  . Nitrofurantoin Hives    REACTION: Reaction not know  . Nsaids     REACTION: GI symptoms  . Rofecoxib     REACTION: GI symptoms  . Simvastatin     REACTION: myalgia  . Sulfamethoxazole-Trimethoprim     REACTION: itching  . Tetracycline     REACTION: Rash    Chief Complaint: new admission  HPI:  80 y/o female patient is here for STR after hospital admission from 06/15/14- 06/18/14 with dyspnea and chest pain. She recently had these symptoms and had undergone cardiac cath which was negative and 2 d echo showed severe pulmonary HTN. She was then discharged home and then her symptoms recurred and worsened and she was brought back to the hospital. she was diagnosed with new bilateral pulmonary embolism and left lower extremity DVT. She had acute respiratory failure with hypoxia. She was started on heparin gtt and transitioned to xarelto. She is tolerating this well. She is working with therapy team in facility and denies any chest pain or dyspnea. No falls in facility. No concerns today. She is seen in her room with SLP team present  Review of Systems:  Constitutional: Negative for fever, chills, weight loss, diaphoresis.  HENT: Negative for congestion, hearing loss and sore throat.   Eyes: Negative for eye pain, blurred vision, double vision and discharge.  Respiratory: Negative for cough, sputum production, shortness of breath and wheezing.   Cardiovascular: Negative for chest pain, palpitations, orthopnea and leg swelling.  Gastrointestinal: Negative for heartburn, nausea, vomiting, abdominal pain, diarrhea and constipation.  Genitourinary: Negative for  dysuria, urgency, frequency, hematuria and flank pain.  Musculoskeletal: Negative for back pain, falls, joint pain and myalgias. using a walker Skin: Negative for itching and rash.  Neurological: Negative for dizziness, tingling, focal weakness and headaches.  Psychiatric/Behavioral: Negative for depression and memory loss. The patient is not nervous/anxious.     Past Medical History  Diagnosis Date  . Diabetes mellitus     type II  . Diverticulosis of colon   . GERD (gastroesophageal reflux disease)   . Hyperlipidemia   . Hypertension   . Arthritis     OA  . Peripheral vascular disease   . PMR (polymyalgia rheumatica)   . Frequent UTI   . Chest tightness     catheterization, 2005, normal coronaries  . Wide-complex tachycardia     SVT in past  . Hypokalemia   . LBBB (left bundle branch block)     Since at least 2005  . Ejection fraction < 50%     EF 40% echo, 09/2009  . Complication of anesthesia   . PONV (postoperative nausea and vomiting)   . PE (pulmonary embolism) 06/16/2014  . Shortness of breath   . UTI (urinary tract infection)     HX OF RECURRENT UTI'   Past Surgical History  Procedure Laterality Date  . Appendectomy  1993  . Cholecystectomy    . Colectomy  1993    partial  . Abdominal hysterectomy  1960s    fibroids total  . Vein ligation and stripping    .  Bladder tack up    . Back surgery    . Esophagogastroduodenoscopy  05/2004  . Bilateral pyelonephritis    . Cardiac catheterization  2005    no significant CAD  . Tonsillectomy     Social History:   reports that she has never smoked. She has never used smokeless tobacco. She reports that she does not drink alcohol or use illicit drugs.  Family History  Problem Relation Age of Onset  . Heart disease Mother     CAD  . Hypertension Mother   . Stroke Mother   . Cancer Sister     pancreatic CA  . Diabetes Brother     Medications: Patient's Medications  New Prescriptions   No medications on  file  Previous Medications   ACETAMINOPHEN (TYLENOL) 500 MG TABLET    Take 500 mg by mouth every 6 (six) hours as needed (pain).    CETIRIZINE (ZYRTEC) 10 MG TABLET    Take 10 mg by mouth daily as needed for allergies.    CHOLECALCIFEROL (VITAMIN D) 1000 UNITS TABLET    Take 1,000 Units by mouth daily.     DARIFENACIN (ENABLEX) 7.5 MG 24 HR TABLET    Take 7.5 mg by mouth daily.     DICLOFENAC SODIUM (VOLTAREN) 1 % GEL    Apply 2 g topically 2 (two) times daily as needed (knee pain).    HYDROCHLOROTHIAZIDE (HYDRODIURIL) 25 MG TABLET    Take 25 mg by mouth daily.   ISOSORBIDE MONONITRATE (IMDUR) 30 MG 24 HR TABLET    Take 30 mg by mouth at bedtime.   METOPROLOL TARTRATE (LOPRESSOR) 25 MG TABLET    Take 25 mg by mouth 2 (two) times daily.   MULTIPLE VITAMINS-MINERALS (ICAPS PO)    Take 1 capsule by mouth daily.   NITROGLYCERIN (NITROSTAT) 0.4 MG SL TABLET    Place 1 tablet (0.4 mg total) under the tongue every 5 (five) minutes as needed for chest pain. As directed and as needed.   OMEGA-3 FATTY ACIDS (FISH OIL) 1000 MG CAPS    Take 1,000 mg by mouth daily.   PANTOPRAZOLE (PROTONIX) 40 MG TABLET    Take 40 mg by mouth 2 (two) times daily.   RIVAROXABAN (XARELTO) 15 MG TABS TABLET    Take 1 tablet (15 mg total) by mouth 2 (two) times daily with a meal. Through 07/07/14, then change to 20 mg once daily   RIVAROXABAN (XARELTO) 20 MG TABS TABLET    Take 1 tablet (20 mg total) by mouth daily with supper. Starting 07/08/14   TRAMADOL (ULTRAM) 50 MG TABLET    Take 50 mg by mouth every 8 (eight) hours as needed (pain).   TROLAMINE SALICYLATE (ASPERCREME) 10 % CREAM    Apply 1 application topically as needed for muscle pain.   VALSARTAN (DIOVAN) 80 MG TABLET    Take 1 tablet (80 mg total) by mouth daily.   VITAMIN E 400 UNIT CAPSULE    Take 400 Units by mouth daily.    Modified Medications   No medications on file  Discontinued Medications   No medications on file     Physical Exam: Filed Vitals:    06/23/14 1044  BP: 114/85  Pulse: 69  Temp: 98.3 F (36.8 C)  Resp: 18  SpO2: 96%    General- elderly female in no acute distress Head- atraumatic, normocephalic Eyes- PERRLA, EOMI, no pallor, no icterus, no discharge Neck- no lymphadenopathy Throat- moist mucus membrane Cardiovascular- normal  s1,s2, no murmurs/ rubs/ gallops Respiratory- bilateral clear to auscultation, no wheeze, no rhonchi, no crackles, no use of accessory muscles Abdomen- bowel sounds present, soft, non tender, no CVA tenderness Musculoskeletal- able to move all 4 extremities,normal range of motion, no leg edema Neurological- no focal deficit Skin- warm and dry Psychiatry- alert and oriented to person, place and time, normal mood and affect   Labs reviewed: Basic Metabolic Panel:  Recent Labs  06/13/14 0525 06/14/14 0336 06/15/14 1719 06/16/14 0351  NA 141 140 142 142  K 3.9 4.1 4.0 4.2  CL 105 105 107 107  CO2 20 19  --  19  GLUCOSE 105* 110* 146* 127*  BUN 48* 43* 34* 31*  CREATININE 1.46* 1.07 1.10 0.95  CALCIUM 8.4 8.7  --  8.8   Liver Function Tests:  Recent Labs  07/31/13 0852  AST 26  ALT 19  ALKPHOS 41  BILITOT 0.7  PROT 6.7  ALBUMIN 3.9   No results found for this basename: LIPASE, AMYLASE,  in the last 8760 hours No results found for this basename: AMMONIA,  in the last 8760 hours CBC:  Recent Labs  07/31/13 0852 06/11/14 1109  06/14/14 0336 06/15/14 1708 06/15/14 1719 06/16/14 0351  WBC 6.0 9.6  < > 8.7 10.5  --  8.2  NEUTROABS 3.4 7.5  --   --  8.1*  --   --   HGB 11.8* 13.4  < > 11.1* 12.3 13.6 11.9*  HCT 35.7* 39.9  < > 34.4* 37.4 40.0 35.9*  MCV 91.7 91.5  < > 93.0 93.7  --  93.2  PLT 277.0 260  < > 222 238  --  214  < > = values in this interval not displayed. Cardiac Enzymes:  Recent Labs  06/11/14 2001 06/12/14 0229  CKTOTAL 57 110  CKMB 4.6* 3.7  TROPONINI 0.56* 0.44*   BNP: No components found with this basename: POCBNP,  CBG:  Recent Labs   06/17/14 2149 06/18/14 0638 06/18/14 1058  GLUCAP 131* 110* 124*    Radiological Exams: Ct Angio Chest Pe W/cm &/or Wo Cm  06/15/2014   CLINICAL DATA:  Hypoxia.  Elevated D-dimer.  EXAM: CT ANGIOGRAPHY CHEST WITH CONTRAST  TECHNIQUE: Multidetector CT imaging of the chest was performed using the standard protocol during bolus administration of intravenous contrast. Multiplanar CT image reconstructions and MIPs were obtained to evaluate the vascular anatomy.  CONTRAST:  58mL OMNIPAQUE IOHEXOL 350 MG/ML SOLN  COMPARISON:  01/17/2005.  FINDINGS: There are multiple, large bilateral occlusive pulmonary emboli. Evidence of right heart strain is identified. The RV/ LV ratio is equal to 1.6. Consistent with right heart strain. The heart size is normal. No pericardial effusion. There are multiple small mediastinal and hilar lymph nodes. No pathologic adenopathy identified. No axillary or supraclavicular adenopathy identified.  There is no pleural effusion. Patchy areas of pneumonitis are identified within the right upper lobe, image 47/series 406. Likely pulmonary infarct. There is no suspicious pulmonary nodule or mass identified.  The trachea appears patent and is midline. Review of the visualized osseous structures is significant for mild multi level thoracic spondylosis. No aggressive lytic or sclerotic bone lesions identified.  Incidental imaging through the upper abdomen is on unremarkable. The adrenal glands appear normal.  Review of the MIP images confirms the above findings.  IMPRESSION: 1. Positive for acute PE with CTevidence of right heart strain (RV/LV Ratio = 1.6.) consistent with at least submassive (intermediate risk) PE. The presence of right  heart strain has been associated with an increased risk of morbidity and mortality. 2. Critical Value/emergent results were called by telephone at the time of interpretation on 06/15/2014 at 8:56 PM to Dr. Threasa Beards BELFI , who verbally acknowledged these results.    Electronically Signed   By: Kerby Moors M.D.   On: 06/15/2014 20:57   Dg Chest Port 1 View  06/15/2014   CLINICAL DATA:  Shortness of Breath  EXAM: PORTABLE CHEST - 1 VIEW  COMPARISON:  06/11/2014  FINDINGS: Cardiomediastinal silhouette is stable. Atherosclerotic calcifications of thoracic aorta again noted. No acute infiltrate or pulmonary edema. There is vague nodular density right midlung laterally measures 8 mm. Further correlation with CT scan of the chest is recommended to exclude a lung nodule.  IMPRESSION: No acute infiltrate or pulmonary edema. Vague nodular density right midlung laterally. Further correlation with CT scan of the chest is recommended to exclude a lung nodule.   Electronically Signed   By: Lahoma Crocker M.D.   On: 06/15/2014 18:15   Dg Chest Port 1 View  06/11/2014   CLINICAL DATA:  Chest pain.  EXAM: PORTABLE CHEST - 1 VIEW  COMPARISON:  CT chest 08/04/2005.  FINDINGS: Mediastinum unremarkable. Stable mild hilar prominence most likely vascular present. Borderline cardiomegaly. No significant pulmonary venous congestion. No focal pulmonary infiltrate. No pleural effusion or pneumothorax.  IMPRESSION: 1. Stable borderline cardiomegaly. 2. No acute cardiopulmonary disease.   Electronically Signed   By: Marcello Moores  Register   On: 06/11/2014 11:28    Assessment/Plan  Generalized weakness New PE and DVT with her respiratory failure has made her weak from deconditioning. Will have patient work with PT/OT as tolerated to regain strength and restore function.  Fall precautions are in place.  New pulmonary embolism Breathing stable. Off oxygen. Continue xarelto for now bid until 7/20 and then once a day for atleast 6 months  DVT Continue xarelto as above  Urinary incontinence Stable with her enablex  OA Stable on prn ultram and voltaren gel. Continue using walker. Fall precuations  HTN Stable. Continue current regimen of diovan 80 mg qd, lopressor 25 mg bid, imdur 30 mg qd and  hctz 25 mg qd, monitor bmp  GERD Continue protonix  Family/ staff Communication: reviewed care plan with patient and nursing supervisor   Goals of care: short term rehabilitation    Labs/tests ordered- cbc, cmp    Blanchie Serve, MD  [facility] [phone] (Monday-Friday 8 am - 5 pm) [phone] (afterhours)

## 2014-06-26 ENCOUNTER — Ambulatory Visit: Payer: Medicare Other | Admitting: Cardiology

## 2014-07-02 ENCOUNTER — Other Ambulatory Visit: Payer: Self-pay

## 2014-07-03 ENCOUNTER — Non-Acute Institutional Stay (SKILLED_NURSING_FACILITY): Payer: Medicare Other | Admitting: Adult Health

## 2014-07-03 DIAGNOSIS — I2699 Other pulmonary embolism without acute cor pulmonale: Secondary | ICD-10-CM

## 2014-07-03 DIAGNOSIS — I10 Essential (primary) hypertension: Secondary | ICD-10-CM

## 2014-07-03 DIAGNOSIS — I824Z9 Acute embolism and thrombosis of unspecified deep veins of unspecified distal lower extremity: Secondary | ICD-10-CM

## 2014-07-03 DIAGNOSIS — I824Z2 Acute embolism and thrombosis of unspecified deep veins of left distal lower extremity: Secondary | ICD-10-CM

## 2014-07-03 LAB — URINALYSIS, COMPLETE
BILIRUBIN, UR: NEGATIVE
Glucose,UR: NEGATIVE mg/dL (ref 0–75)
Ketone: NEGATIVE
Nitrite: POSITIVE
PH: 5 (ref 4.5–8.0)
Protein: 30
Specific Gravity: 1.01 (ref 1.003–1.030)

## 2014-07-04 MED ORDER — CEPHALEXIN 250 MG PO CAPS: 250.0000 mg | ORAL_CAPSULE | Freq: Every day | ORAL | Status: DC

## 2014-07-04 MED ORDER — CIPROFLOXACIN HCL 500 MG PO TABS: 500.0000 mg | ORAL_TABLET | Freq: Two times a day (BID) | ORAL | Status: AC

## 2014-07-04 NOTE — Progress Notes (Signed)
Patient ID: Ellen Peterson, female   DOB: 1920/03/05, 80 y.o.   MRN: [id number]     [facility]   Chief Complaint  Patient presents with  . Discharge Note    HPI:  She is being discharged to home with home health for pt/ot services to continue to improve upon her strength; gait and level of independence with her adl's. She will need front wheel walker (child's) and will need her prescriptions to be written. We are awaiting her urine culture results. She has apparently been on long term keflex 250 mg daily will begin her also on cipro 500 mg twice daily for 10 days. If there is a need to change her abt will do so on 07-07-14.   Past Medical History  Diagnosis Date  . Diabetes mellitus     type II  . Diverticulosis of colon   . GERD (gastroesophageal reflux disease)   . Hyperlipidemia   . Hypertension   . Arthritis     OA  . Peripheral vascular disease   . PMR (polymyalgia rheumatica)   . Frequent UTI   . Chest tightness     catheterization, 2005, normal coronaries  . Wide-complex tachycardia     SVT in past  . Hypokalemia   . LBBB (left bundle branch block)     Since at least 2005  . Ejection fraction < 50%     EF 40% echo, 09/2009  . Complication of anesthesia   . PONV (postoperative nausea and vomiting)   . PE (pulmonary embolism) 06/16/2014  . Shortness of breath   . UTI (urinary tract infection)     HX OF RECURRENT UTI'    Past Surgical History  Procedure Laterality Date  . Appendectomy  1993  . Cholecystectomy    . Colectomy  1993    partial  . Abdominal hysterectomy  1960s    fibroids total  . Vein ligation and stripping    . Bladder tack up    . Back surgery    . Esophagogastroduodenoscopy  05/2004  . Bilateral pyelonephritis    . Cardiac catheterization  2005    no significant CAD  . Tonsillectomy      VITAL SIGNS BP 133/76  Pulse 79  Ht 5' (1.524 m)  Wt 148 lb (67.132 kg)  BMI 28.90 kg/m2   Patient's Medications  New Prescriptions   No  medications on file  Previous Medications   ACETAMINOPHEN (TYLENOL) 500 MG TABLET    Take 500 mg by mouth every 6 (six) hours as needed (pain).    CETIRIZINE (ZYRTEC) 10 MG TABLET    Take 10 mg by mouth daily as needed for allergies.    CHOLECALCIFEROL (VITAMIN D) 1000 UNITS TABLET    Take 1,000 Units by mouth daily.     DARIFENACIN (ENABLEX) 7.5 MG 24 HR TABLET    Take 7.5 mg by mouth daily.     DICLOFENAC SODIUM (VOLTAREN) 1 % GEL    Apply 2 g topically 2 (two) times daily as needed (knee pain).    HYDROCHLOROTHIAZIDE (HYDRODIURIL) 25 MG TABLET    Take 25 mg by mouth daily.   ISOSORBIDE MONONITRATE (IMDUR) 30 MG 24 HR TABLET    Take 30 mg by mouth at bedtime.   METOPROLOL TARTRATE (LOPRESSOR) 25 MG TABLET    Take 25 mg by mouth 2 (two) times daily.   MULTIPLE VITAMINS-MINERALS (ICAPS PO)    Take 1 capsule by mouth daily.   NITROGLYCERIN (NITROSTAT) 0.4  MG SL TABLET    Place 1 tablet (0.4 mg total) under the tongue every 5 (five) minutes as needed for chest pain. As directed and as needed.   OMEGA-3 FATTY ACIDS (FISH OIL) 1000 MG CAPS    Take 1,000 mg by mouth daily.   PANTOPRAZOLE (PROTONIX) 40 MG TABLET    Take 40 mg by mouth 2 (two) times daily.   RIVAROXABAN (XARELTO) 15 MG TABS TABLET    Take 1 tablet (15 mg total) by mouth 2 (two) times daily with a meal. Through 07/07/14, then change to 20 mg once daily   RIVAROXABAN (XARELTO) 20 MG TABS TABLET    Take 1 tablet (20 mg total) by mouth daily with supper. Starting 07/08/14   TRAMADOL (ULTRAM) 50 MG TABLET    Take 50 mg by mouth every 8 (eight) hours as needed (pain).   TROLAMINE SALICYLATE (ASPERCREME) 10 % CREAM    Apply 1 application topically as needed for muscle pain.   VALSARTAN (DIOVAN) 80 MG TABLET    Take 1 tablet (80 mg total) by mouth daily.   VITAMIN E 400 UNIT CAPSULE    Take 400 Units by mouth daily.    Modified Medications   No medications on file  Discontinued Medications   No medications on file    SIGNIFICANT DIAGNOSTIC  EXAMS  06-11-14: chest x-ray: 1. Stable borderline cardiomegaly. 2. No acute cardiopulmonary disease.   06-12-14: TEE: Left ventricle: The cavity size was normal. Systolic function was normal. The estimated ejection fraction was in the range of 60% to 65%. Wall motion was normal; there were no regional wall motion abnormalities. - Ventricular septum: Septal motion showed mild paradox. These changes are consistent with a left bundle branch block. - Aortic valve: Mildly to moderately calcified annulus. Trileaflet. Mild thickening and calcification, consistent with sclerosis. There was mild regurgitation. - Mitral valve: Calcified annulus. - Right ventricle: The cavity size was mildly to moderately dilated. Wall thickness was normal. Systolic function was moderately reduced. - Right atrium: The atrium was moderately dilated. 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 - Tricuspid valve: There was moderate regurgitation. - Pulmonary arteries: PA peak pressure: 63 mm Hg (S).  06-13-14: ct angio of chest1. Positive for acute PE with CTevidence of right heart strain (RV/LV Ratio = 1.6.) consistent with at least submassive (intermediate risk) PE. The presence of right heart strain has been associated with an increased risk of morbidity and mortality.  06-15-14: chest x-ray: No acute infiltrate or pulmonary edema. Vague nodular density right midlung laterally. Further correlation with CT scan of the chest is recommended to exclude a lung nodule.   06-16-14: bilateral lower extremity dopplers: Right leg is negative for deep and superficial vein thrombosis. The left leg has ACUTE DVT in the peroneal veins in the calf.     LABS REVIEWED:   06-11-14: wbc 9.6; hgb 13.4 ;hct 39.9 ;mcv 91.5; plt 260; glucose 132; bun 33; creat 0.87; k+4.2; na++144; BNP 7029 06-12-14: chol 207; ldl 123; trig 102 06-15-14; wbc 10.5; hgb 12.3; hct 37.4; mcv 93.7;  plt 238; BNP 2873; D-dimer: 12.99  07-03-14; wbc 8.2; hgb 10.3; hct 33.3 ;mcv 97.4; plt 342   .  Review of Systems  Constitutional: negative for fatigue   Respiratory: negative for shortness of breath . Negative for cough and wheezing.   Cardiovascular: Negative for chest pain, palpitations and leg swelling.  Gastrointestinal: Negative for heartburn, abdominal pain and  constipation.  Musculoskeletal: Negative for joint pain and myalgias.  Skin: Negative.   Psychiatric/Behavioral: Negative for depression. The patient is not nervous/anxious.      Physical Exam  Constitutional: She is oriented to person, place, and time. She appears well-developed and well-nourished. No distress.  Neck: Neck supple. No JVD present.  Cardiovascular: Normal rate, regular rhythm and intact distal pulses.   Respiratory: Effort normal. No respiratory distress. She has no wheezes.  Breath sounds slightly diminished   GI: Soft. Bowel sounds are normal. She exhibits no distension. There is no tenderness.  Musculoskeletal: She exhibits no edema.  Is able to move all extremities   Neurological: She is alert and oriented to person, place, and time.  Skin: Skin is warm and dry. She is not diaphoretic.  Psychiatric: She has a normal mood and affect.     ASSESSMENT/ PLAN:  Will discharge to home with home health for pt/ot. She will need a front wheel walker (child's). She has a follow up appointment scheduled with Dr. [name] (pcp) 07-11-14 at 12 pm. Her prescriptions have been written she has been written for ultram 50 mg tabs #30.

## 2014-07-05 LAB — URINE CULTURE

## 2014-07-11 ENCOUNTER — Encounter: Payer: Self-pay | Admitting: Family Medicine

## 2014-07-11 ENCOUNTER — Ambulatory Visit (INDEPENDENT_AMBULATORY_CARE_PROVIDER_SITE_OTHER): Payer: Medicare Other | Admitting: Family Medicine

## 2014-07-11 VITALS — BP 128/72 | HR 63 | Temp 97.8°F | Ht 62.0 in | Wt 150.0 lb

## 2014-07-11 DIAGNOSIS — I2699 Other pulmonary embolism without acute cor pulmonale: Secondary | ICD-10-CM

## 2014-07-11 DIAGNOSIS — I824Z9 Acute embolism and thrombosis of unspecified deep veins of unspecified distal lower extremity: Secondary | ICD-10-CM

## 2014-07-11 DIAGNOSIS — I824Z2 Acute embolism and thrombosis of unspecified deep veins of left distal lower extremity: Secondary | ICD-10-CM

## 2014-07-11 DIAGNOSIS — N390 Urinary tract infection, site not specified: Secondary | ICD-10-CM

## 2014-07-11 MED ORDER — NITROGLYCERIN 0.4 MG SL SUBL: 0.4000 mg | SUBLINGUAL_TABLET | SUBLINGUAL | Status: DC | PRN

## 2014-07-11 NOTE — Assessment & Plan Note (Signed)
This lead to DVT No symptoms now at all -nl leg exam  Rev hosp records and studies in detail Is doing well with PT/OT after inpt rehab

## 2014-07-11 NOTE — Assessment & Plan Note (Signed)
She is finishing current cipro for acute uti in rehab- and then will go back to prophylaxis  Cr had improved at hosp d/c

## 2014-07-11 NOTE — Progress Notes (Signed)
Pre visit review using our clinic review tool, if applicable. No additional management support is needed unless otherwise documented below in the visit note. 

## 2014-07-11 NOTE — Progress Notes (Signed)
Subjective:    Patient ID: Ellen Peterson, female    DOB: Feb 16, 1920, 80 y.o.   MRN: [id number]  HPI Here for f/u after short term rehab stay at [facility] for recovery after PE and DVT (hosp 6/28-7/1)  Had a first hosp for CP with nl cath but mod pulm HTN seen on echo Then returned to [facility] with PE (hypoxia/resp distress) and DVT was found in LLE  Family is upset they sent her home feeling poorly - they were unsatisfied with her hospital care  They state that the care was not good at the hospital   Had PT/OT Continues at home  Using front wheeled walker   Lab Results  Component Value Date   WBC 8.2 06/16/2014   HGB 11.9* 06/16/2014   HCT 35.9* 06/16/2014   MCV 93.2 06/16/2014   PLT 214 06/16/2014      Chemistry      Component Value Date/Time   NA 142 06/16/2014 0351   K 4.2 06/16/2014 0351   CL 107 06/16/2014 0351   CO2 19 06/16/2014 0351   BUN 31* 06/16/2014 0351   CREATININE 0.95 06/16/2014 0351      Component Value Date/Time   CALCIUM 8.8 06/16/2014 0351   ALKPHOS 41 07/31/2013 0852   AST 26 07/31/2013 0852   ALT 19 07/31/2013 0852   BILITOT 0.7 07/31/2013 0852       Wt is up 2 lb with bmi of 27 On xarelto for 6 mo  On cipro- per family - they stopped it in the hospital- got a uti - started it back at [facility] and then will finish on Monday and go back to prophylactic dose  Pt states her uti symptoms are improved   They also watched her swallowing in [facility] - thought she was having trouble (gets choked on food)- and they recommended a f/u with GI  Wants to prevent aspiration  She is eating with caution/ slowly and chewing better   Has been home a week  Has good and bad days    Patient Active Problem List   Diagnosis Date Noted  . Essential hypertension, benign 06/22/2014  . Angina pectoris 06/22/2014  . UI (urinary incontinence) 06/22/2014  . GERD (gastroesophageal reflux disease) 06/22/2014  . Physical deconditioning 06/22/2014  . Acute deep vein  thrombosis (DVT) of distal vein of left lower extremity 06/16/2014  . Acute pulmonary embolism 06/15/2014  . Bilateral pulmonary embolism 06/15/2014  . Acute respiratory failure with hypoxia 06/15/2014  . Acute cor pulmonale 06/15/2014  . Encounter for Medicare annual wellness exam 08/08/2013  . LBBB (left bundle branch block)   . Hyperlipidemia   . Chest tightness   . Wide-complex tachycardia   . Ejection fraction < 50%   . Venous insufficiency 06/03/2011  . HYPERGLYCEMIA 11/17/2010  . UNSPECIFIED ANEMIA 02/08/2010  . RENAL INSUFFICIENCY 10/27/2009  . UTI'S, RECURRENT 09/22/2008  . UNSPECIFIED VITAMIN D DEFICIENCY 09/10/2008  . COLONIC POLYPS, ADENOMATOUS 04/25/2007  . PERIPHERAL VASCULAR DISEASE 04/25/2007  . GERD 04/25/2007  . DIVERTICULOSIS, COLON 04/25/2007  . IBS 04/25/2007  . OSTEOARTHRITIS 04/25/2007  . POLYMYALGIA RHEUMATICA 04/25/2007   Past Medical History  Diagnosis Date  . Diabetes mellitus     type II  . Diverticulosis of colon   . GERD (gastroesophageal reflux disease)   . Hyperlipidemia   . Hypertension   . Arthritis     OA  . Peripheral vascular disease   . PMR (polymyalgia rheumatica)   .  Frequent UTI   . Chest tightness     catheterization, 2005, normal coronaries  . Wide-complex tachycardia     SVT in past  . Hypokalemia   . LBBB (left bundle branch block)     Since at least 2005  . Ejection fraction < 50%     EF 40% echo, 09/2009  . Complication of anesthesia   . PONV (postoperative nausea and vomiting)   . PE (pulmonary embolism) 06/16/2014  . Shortness of breath   . UTI (urinary tract infection)     HX OF RECURRENT UTI'   Past Surgical History  Procedure Laterality Date  . Appendectomy  1993  . Cholecystectomy    . Colectomy  1993    partial  . Abdominal hysterectomy  1960s    fibroids total  . Vein ligation and stripping    . Bladder tack up    . Back surgery    . Esophagogastroduodenoscopy  05/2004  . Bilateral pyelonephritis     . Cardiac catheterization  2005    no significant CAD  . Tonsillectomy     History  Substance Use Topics  . Smoking status: Never Smoker   . Smokeless tobacco: Never Used  . Alcohol Use: No   Family History  Problem Relation Age of Onset  . Heart disease Mother     CAD  . Hypertension Mother   . Stroke Mother   . Cancer Sister     pancreatic CA  . Diabetes Brother    Allergies  Allergen Reactions  . Erythromycin     REACTION: Rash  . Metformin     REACTION: vomiting and diarrhea  . Metronidazole     REACTION: whelps  . Nitrofurantoin Hives    REACTION: Reaction not know  . Nsaids     REACTION: GI symptoms  . Rofecoxib     REACTION: GI symptoms  . Simvastatin     REACTION: myalgia  . Sulfamethoxazole-Trimethoprim     REACTION: itching  . Tetracycline     REACTION: Rash   Current Outpatient Prescriptions on File Prior to Visit  Medication Sig Dispense Refill  . acetaminophen (TYLENOL) 500 MG tablet Take 500 mg by mouth every 6 (six) hours as needed (pain).       . cholecalciferol (VITAMIN D) 1000 UNITS tablet Take 1,000 Units by mouth daily.        . ciprofloxacin (CIPRO) 500 MG tablet Take 1 tablet (500 mg total) by mouth 2 (two) times daily.  20 tablet  0  . diclofenac sodium (VOLTAREN) 1 % GEL Apply 2 g topically 2 (two) times daily as needed (knee pain).       . hydrochlorothiazide (HYDRODIURIL) 25 MG tablet Take 25 mg by mouth daily.      . isosorbide mononitrate (IMDUR) 30 MG 24 hr tablet Take 30 mg by mouth at bedtime.      . metoprolol tartrate (LOPRESSOR) 25 MG tablet Take 25 mg by mouth 2 (two) times daily.      . Multiple Vitamins-Minerals (ICAPS PO) Take 1 capsule by mouth daily.      . Omega-3 Fatty Acids (FISH OIL) 1000 MG CAPS Take 1,000 mg by mouth daily.      . pantoprazole (PROTONIX) 40 MG tablet Take 40 mg by mouth 2 (two) times daily.      . rivaroxaban (XARELTO) 20 MG TABS tablet Take 1 tablet (20 mg total) by mouth daily with supper. Starting  07/08/14  30 tablet    .  traMADol (ULTRAM) 50 MG tablet Take 50 mg by mouth every 8 (eight) hours as needed (pain).      Marland Kitchen trolamine salicylate (ASPERCREME) 10 % cream Apply 1 application topically as needed for muscle pain.      . valsartan (DIOVAN) 80 MG tablet Take 1 tablet (80 mg total) by mouth daily.  30 tablet  23  . vitamin E 400 UNIT capsule Take 400 Units by mouth daily.        . cephALEXin (KEFLEX) 250 MG capsule Take 1 capsule (250 mg total) by mouth daily.  30 capsule  11   No current facility-administered medications on file prior to visit.    Review of Systems    Review of Systems  Constitutional: Negative for fever, appetite change, and unexpected weight change.  Eyes: Negative for pain and visual disturbance.  Respiratory: Negative for cough and shortness of breath.   Cardiovascular: Negative for palpitations   pos for chest/ chest wall soreness  Gastrointestinal: Negative for nausea, diarrhea and constipation.  Genitourinary: pos for baseline urgency and frequency, neg for dysuria  Skin: Negative for pallor or rash   Neurological: Negative for , light-headedness, numbness and headaches. pos for generalized weakness and poor balance that is improved  Hematological: Negative for adenopathy. Does not bruise/bleed easily.  Psychiatric/Behavioral: Negative for dysphoric mood. The patient is not nervous/anxious.      Objective:   Physical Exam  Constitutional: She appears well-developed and well-nourished. No distress.  Well appearing elderly female using walker well  Can get up to table with assistance   HENT:  Head: Normocephalic and atraumatic.  Mouth/Throat: Oropharynx is clear and moist.  Eyes: Conjunctivae and EOM are normal. Pupils are equal, round, and reactive to light.  Neck: Normal range of motion. Neck supple. No JVD present. Carotid bruit is not present.  Cardiovascular: Normal rate, regular rhythm, normal heart sounds and intact distal pulses.  Exam reveals  no gallop.   Pulmonary/Chest: Breath sounds normal. No respiratory distress. She has no wheezes. She has no rales. She exhibits tenderness.  Mild anterior chest wall tenderness without skin change or crepitus  Abdominal: Soft. Bowel sounds are normal. She exhibits no distension and no mass. There is no tenderness.  Musculoskeletal: She exhibits no edema and no tenderness.  Neither leg is tender or swollen No palp cords and neg homan sign Varicosities noted   Lymphadenopathy:    She has no cervical adenopathy.  Neurological: She is alert. She has normal reflexes. No cranial nerve deficit. She exhibits normal muscle tone. Coordination normal.  Skin: Skin is warm and dry. No rash noted. No erythema. No pallor.  Psychiatric: She has a normal mood and affect.          Assessment & Plan:   Problem List Items Addressed This Visit     Cardiovascular and Mediastinum   Bilateral pulmonary embolism - Primary     Pt is doing well with xarelto -no resp distress/ some lingering anterior chest soreness Rehab went well-she continues at home  Will continue to follow  Has appt with cardiol in Aug- to disc pulm HTN (likely due to above) found on echo as well     Relevant Medications      nitroGLYCERIN (NITROSTAT) SL tablet   Acute deep vein thrombosis (DVT) of distal vein of left lower extremity     This lead to DVT No symptoms now at all -nl leg exam  Rev hosp records and studies in detail Is doing  well with PT/OT after inpt rehab     Relevant Medications      nitroGLYCERIN (NITROSTAT) SL tablet     Genitourinary   UTI'S, RECURRENT     She is finishing current cipro for acute uti in rehab- and then will go back to prophylaxis  Cr had improved at hosp d/c

## 2014-07-11 NOTE — Assessment & Plan Note (Signed)
Pt is doing well with xarelto -no resp distress/ some lingering anterior chest soreness Rehab went well-she continues at home  Will continue to follow  Has appt with cardiol in Aug- to disc pulm HTN (likely due to above) found on echo as well

## 2014-07-11 NOTE — Patient Instructions (Addendum)
I'm glad you are home and feeling better  Continue PT / OT and use of your walker to keep getting stronger  Watch out for falls  Follow up with Dr Ron Parker as planned  Continue current medicines  Let me know if any symptoms worsen or change

## 2014-07-23 ENCOUNTER — Encounter: Payer: Self-pay | Admitting: Cardiology

## 2014-07-23 DIAGNOSIS — I272 Pulmonary hypertension, unspecified: Secondary | ICD-10-CM | POA: Insufficient documentation

## 2014-07-23 DIAGNOSIS — I519 Heart disease, unspecified: Secondary | ICD-10-CM | POA: Insufficient documentation

## 2014-07-23 DIAGNOSIS — I2510 Atherosclerotic heart disease of native coronary artery without angina pectoris: Secondary | ICD-10-CM | POA: Insufficient documentation

## 2014-07-23 DIAGNOSIS — R9430 Abnormal result of cardiovascular function study, unspecified: Secondary | ICD-10-CM | POA: Insufficient documentation

## 2014-07-23 DIAGNOSIS — Z86718 Personal history of other venous thrombosis and embolism: Secondary | ICD-10-CM | POA: Insufficient documentation

## 2014-07-23 DIAGNOSIS — R4702 Dysphasia: Secondary | ICD-10-CM | POA: Insufficient documentation

## 2014-07-23 DIAGNOSIS — Z9229 Personal history of other drug therapy: Secondary | ICD-10-CM | POA: Insufficient documentation

## 2014-07-25 ENCOUNTER — Encounter: Payer: Self-pay | Admitting: Cardiology

## 2014-07-25 ENCOUNTER — Ambulatory Visit (INDEPENDENT_AMBULATORY_CARE_PROVIDER_SITE_OTHER): Payer: Medicare Other | Admitting: Cardiology

## 2014-07-25 VITALS — BP 148/57 | HR 62 | Ht 60.0 in | Wt 148.1 lb

## 2014-07-25 DIAGNOSIS — I272 Pulmonary hypertension, unspecified: Secondary | ICD-10-CM

## 2014-07-25 DIAGNOSIS — Z9229 Personal history of other drug therapy: Secondary | ICD-10-CM

## 2014-07-25 DIAGNOSIS — I82409 Acute embolism and thrombosis of unspecified deep veins of unspecified lower extremity: Secondary | ICD-10-CM

## 2014-07-25 DIAGNOSIS — I2699 Other pulmonary embolism without acute cor pulmonale: Secondary | ICD-10-CM

## 2014-07-25 DIAGNOSIS — I2789 Other specified pulmonary heart diseases: Secondary | ICD-10-CM

## 2014-07-25 DIAGNOSIS — Z7901 Long term (current) use of anticoagulants: Secondary | ICD-10-CM

## 2014-07-25 MED ORDER — RIVAROXABAN 20 MG PO TABS: 20.0000 mg | ORAL_TABLET | Freq: Every day | ORAL | Status: DC

## 2014-07-25 NOTE — Assessment & Plan Note (Signed)
DVT was found by Doppler. She did not have clinical symptoms. She is being treated.

## 2014-07-25 NOTE — Assessment & Plan Note (Signed)
Very careful decisions we made over time concerning how long to use Xarelto. We will treat for at least 6 months.

## 2014-07-25 NOTE — Progress Notes (Signed)
Patient ID: Ellen Peterson, female   DOB: 1920/09/06, 80 y.o.   MRN: [id number]    HPI   Patient is seen today to follow her post hospital and to follow her overall cardiac status. I had seen her on May 30, 2014. At that time she did not complain of significant problems. I understand from her daughters here today that she may have had some increased shortness of breath at that time. Approximately a week later she was admitted with worsening shortness of breath. She did undergo cardiac catheterization. Her coronaries were stable. It was noted that she had pulmonary hypertension. It was felt that she was stable for discharge. She was discharged home but within 24 hours felt worse and was returned to the hospital. At that time CT scan revealed significant bilateral pulmonary emboli. She did have DVT of one of her legs. She was treated with Xarelto. She continues to improve.  Today she is here in stable. She is here with 2 of her daughters. Her daughter [name] and understands the medical terms concerning the patient very well. She is disappointed that the patient was discharged home and then had to return shortly at which time the diagnosis of pulmonary emboli was made. Of course she is most pleased with the fact that the patient is doing better. Allergies  Allergen Reactions  . Nitrofurantoin Hives  . Tetracycline     REACTION: Rash  . Erythromycin     REACTION: Rash  . Metformin     REACTION: vomiting and diarrhea  . Metronidazole     REACTION: whelps  . Nsaids     REACTION: GI symptoms  . Rofecoxib     REACTION: GI symptoms  . Simvastatin     REACTION: myalgia  . Sulfamethoxazole-Trimethoprim     REACTION: itching    Current Outpatient Prescriptions  Medication Sig Dispense Refill  . acetaminophen (TYLENOL) 500 MG tablet Take 500 mg by mouth every 6 (six) hours as needed (pain).       . cephALEXin (KEFLEX) 250 MG capsule Take 1 capsule (250 mg total) by mouth daily.  30 capsule  11   . cholecalciferol (VITAMIN D) 1000 UNITS tablet Take 1,000 Units by mouth daily.        Marland Kitchen CINNAMON PO Take 1,000 mg by mouth daily.      . diclofenac sodium (VOLTAREN) 1 % GEL Apply 2 g topically 2 (two) times daily as needed (knee pain).       . hydrochlorothiazide (HYDRODIURIL) 25 MG tablet Take 25 mg by mouth daily.      . isosorbide mononitrate (IMDUR) 30 MG 24 hr tablet Take 30 mg by mouth at bedtime.      . metoprolol tartrate (LOPRESSOR) 25 MG tablet Take 25 mg by mouth 2 (two) times daily.      . Multiple Vitamins-Minerals (ICAPS PO) Take 1 capsule by mouth daily.      . nitroGLYCERIN (NITROSTAT) 0.4 MG SL tablet Place 1 tablet (0.4 mg total) under the tongue every 5 (five) minutes as needed for chest pain. As directed and as needed.  10 tablet  3  . Omega-3 Fatty Acids (FISH OIL) 1000 MG CAPS Take 1,000 mg by mouth daily.      . pantoprazole (PROTONIX) 40 MG tablet Take 40 mg by mouth 2 (two) times daily.      . rivaroxaban (XARELTO) 20 MG TABS tablet Take 1 tablet (20 mg total) by mouth daily with supper. Starting 07/08/14  30 tablet  3  . traMADol (ULTRAM) 50 MG tablet Take 50 mg by mouth every 8 (eight) hours as needed (pain).      Marland Kitchen trolamine salicylate (ASPERCREME) 10 % cream Apply 1 application topically as needed for muscle pain.      . valsartan (DIOVAN) 80 MG tablet Take 1 tablet (80 mg total) by mouth daily.  30 tablet  23  . vitamin E 400 UNIT capsule Take 400 Units by mouth daily.         No current facility-administered medications for this visit.    History   Social History  . Marital Status: Widowed    Spouse Name: N/A    Number of Children: N/A  . Years of Education: N/A   Occupational History  . Not on file.   Social History Main Topics  . Smoking status: Never Smoker   . Smokeless tobacco: Never Used  . Alcohol Use: No  . Drug Use: No  . Sexual Activity: Not on file   Other Topics Concern  . Not on file   Social History Narrative  . No narrative on  file    Family History  Problem Relation Age of Onset  . Heart disease Mother     CAD  . Hypertension Mother   . Stroke Mother   . Cancer Sister     pancreatic CA  . Diabetes Brother     Past Medical History  Diagnosis Date  . Diabetes mellitus     type II  . Diverticulosis of colon   . GERD (gastroesophageal reflux disease)   . Hyperlipidemia   . Hypertension   . Arthritis     OA  . Peripheral vascular disease   . PMR (polymyalgia rheumatica)   . Frequent UTI   . Chest tightness     catheterization, 2005, normal coronaries  . Wide-complex tachycardia     SVT in past  . Hypokalemia   . LBBB (left bundle branch block)     Since at least 2005  . Ejection fraction < 50%     EF 40% echo, 09/2009  . Complication of anesthesia   . PONV (postoperative nausea and vomiting)   . PE (pulmonary embolism) 06/16/2014  . Shortness of breath   . UTI (urinary tract infection)     HX OF RECURRENT UTI'  . Ejection fraction     Past Surgical History  Procedure Laterality Date  . Appendectomy  1993  . Cholecystectomy    . Colectomy  1993    partial  . Abdominal hysterectomy  1960s    fibroids total  . Vein ligation and stripping    . Bladder tack up    . Back surgery    . Esophagogastroduodenoscopy  05/2004  . Bilateral pyelonephritis    . Cardiac catheterization  2005    no significant CAD  . Tonsillectomy      Patient Active Problem List   Diagnosis Date Noted  . DVT (deep venous thrombosis) 07/23/2014  . CAD (coronary artery disease), native coronary artery 07/23/2014  . Pulmonary hypertension 07/23/2014  . Right ventricular dysfunction 07/23/2014  . HX: anticoagulation 07/23/2014  . Dysphasia 07/23/2014  . Ejection fraction   . Essential hypertension, benign 06/22/2014  . UI (urinary incontinence) 06/22/2014  . GERD (gastroesophageal reflux disease) 06/22/2014  . Physical deconditioning 06/22/2014  . Bilateral pulmonary embolism 06/15/2014  . Encounter for  Medicare annual wellness exam 08/08/2013  . LBBB (left bundle branch block)   .  Hyperlipidemia   . Wide-complex tachycardia   . Venous insufficiency 06/03/2011  . HYPERGLYCEMIA 11/17/2010  . UNSPECIFIED ANEMIA 02/08/2010  . RENAL INSUFFICIENCY 10/27/2009  . UTI'S, RECURRENT 09/22/2008  . UNSPECIFIED VITAMIN D DEFICIENCY 09/10/2008  . COLONIC POLYPS, ADENOMATOUS 04/25/2007  . PERIPHERAL VASCULAR DISEASE 04/25/2007  . GERD 04/25/2007  . DIVERTICULOSIS, COLON 04/25/2007  . IBS 04/25/2007  . OSTEOARTHRITIS 04/25/2007  . POLYMYALGIA RHEUMATICA 04/25/2007    ROS    patient denies fever, chills, headache, sweats, rash, change in vision, change in hearing, chest pain, cough, nausea vomiting, urinary symptoms. All other systems are reviewed. She has a slight tugging sensation in her chest that she has had over prolonged periods of time. This has not changed.  PHYSICAL EXAM    she is oriented to person time and place. Affect is normal. She walks with a walker. She is here with 2 of her 3 daughters. Head is atraumatic. Sclera and conjunctiva are normal. There is no jugulovenous distention. Lungs reveal a few scattered rhonchi. Cardiac exam reveals S1 and S2. The abdomen is soft. There is no significant peripheral edema.  Filed Vitals:   07/25/14 1509  BP: 148/57  Pulse: 62  Height: 5' (1.524 m)  Weight: 148 lb 1.9 oz (67.187 kg)    ASSESSMENT & PLAN

## 2014-07-25 NOTE — Assessment & Plan Note (Signed)
She had significant pulmonary hypertension at the time of her diagnosis of pulmonary emboli. Over time we will consider followup echo.

## 2014-07-25 NOTE — Patient Instructions (Addendum)
**Note De-identified  Obfuscation** Your physician recommends that you continue on your current medications as directed. Please refer to the Current Medication list given to you today.  Your physician recommends that you schedule a follow-up appointment in: 5 weeks  

## 2014-07-25 NOTE — Assessment & Plan Note (Signed)
The patient was diagnosed with bilateral pulmonary emboli. She is on Xarelto. She did have DVT proven by Doppler. She had no clinical discomfort in the leg. The event was spontaneous. She will certainly need to be on Xarelto for at least 6 months. With her presentation, I may leave her on it longer or indefinitely.

## 2014-07-29 ENCOUNTER — Other Ambulatory Visit: Payer: Self-pay | Admitting: *Deleted

## 2014-07-29 DIAGNOSIS — I10 Essential (primary) hypertension: Secondary | ICD-10-CM

## 2014-07-29 MED ORDER — CEPHALEXIN 250 MG PO CAPS: 250.0000 mg | ORAL_CAPSULE | Freq: Every day | ORAL | Status: DC

## 2014-07-29 NOTE — Telephone Encounter (Signed)
Fax refill request, please advise  

## 2014-07-29 NOTE — Telephone Encounter (Signed)
done

## 2014-07-29 NOTE — Telephone Encounter (Signed)
Keflex is for uti prevention - please refill times 5 She should no longer need the cipro - that was for an active infection

## 2014-08-07 ENCOUNTER — Telehealth: Payer: Self-pay | Admitting: Family Medicine

## 2014-08-07 NOTE — Telephone Encounter (Signed)
Received PA form on pt's [employer], form placed in your inbox

## 2014-08-07 NOTE — Telephone Encounter (Signed)
I do not see it on her med list currently-please ask her about it (and who px it) , thanks

## 2014-08-12 NOTE — Telephone Encounter (Signed)
I think the px originally came from Dr Kendall Flack - please send PA to him / I think her last visit was april

## 2014-08-12 NOTE — Telephone Encounter (Signed)
Could not get in touch with family but spoke to pt and she is requesting refill of Rx, she said they stopped Rx when she was in hospital but now she is back at home she is needing something to help her control her bladder , pt is worried that is she can't control it especially at night, she will have another UTI, pt wasn't sure who prescribed Rx last, do you want to do PA, please advise

## 2014-08-13 ENCOUNTER — Other Ambulatory Visit: Payer: Medicare Other

## 2014-08-14 ENCOUNTER — Other Ambulatory Visit: Payer: Self-pay | Admitting: *Deleted

## 2014-08-14 MED ORDER — METOPROLOL TARTRATE 25 MG PO TABS: 25.0000 mg | ORAL_TABLET | Freq: Two times a day (BID) | ORAL | Status: DC

## 2014-08-14 MED ORDER — TRAMADOL HCL 50 MG PO TABS: 50.0000 mg | ORAL_TABLET | Freq: Three times a day (TID) | ORAL | Status: DC | PRN

## 2014-08-14 NOTE — Telephone Encounter (Signed)
Fax refill request, please advise  

## 2014-08-14 NOTE — Telephone Encounter (Signed)
Rx called in as prescribed 

## 2014-08-14 NOTE — Telephone Encounter (Signed)
PA sent to Dr. Cy Blamer office

## 2014-08-14 NOTE — Telephone Encounter (Signed)
Px written for call in   

## 2014-08-20 ENCOUNTER — Ambulatory Visit (INDEPENDENT_AMBULATORY_CARE_PROVIDER_SITE_OTHER): Payer: Medicare Other | Admitting: Cardiology

## 2014-08-20 ENCOUNTER — Encounter: Payer: Medicare Other | Admitting: Family Medicine

## 2014-08-20 ENCOUNTER — Encounter: Payer: Self-pay | Admitting: Cardiology

## 2014-08-20 VITALS — BP 148/70 | HR 62 | Ht 60.0 in | Wt 150.4 lb

## 2014-08-20 DIAGNOSIS — Z9229 Personal history of other drug therapy: Secondary | ICD-10-CM

## 2014-08-20 DIAGNOSIS — Z7901 Long term (current) use of anticoagulants: Secondary | ICD-10-CM

## 2014-08-20 DIAGNOSIS — I2699 Other pulmonary embolism without acute cor pulmonale: Secondary | ICD-10-CM

## 2014-08-20 DIAGNOSIS — I82409 Acute embolism and thrombosis of unspecified deep veins of unspecified lower extremity: Secondary | ICD-10-CM

## 2014-08-20 DIAGNOSIS — I519 Heart disease, unspecified: Secondary | ICD-10-CM

## 2014-08-20 DIAGNOSIS — R0602 Shortness of breath: Secondary | ICD-10-CM | POA: Insufficient documentation

## 2014-08-20 NOTE — Assessment & Plan Note (Signed)
The patient did have DVT at the time of her pulmonary emboli. However there was no obvious cause. I am still inclined to probably continue her anticoagulation long-term

## 2014-08-20 NOTE — Assessment & Plan Note (Signed)
The patient is having some exertional shortness of breath at this time. She does have trace edema. She is on hydrochlorothiazide. Followup echo will be done to reassess LV and RV function and her pulmonary artery pressures. After I have that data, hold decide if we will change her hydrochlorothiazide to furosemide to tried to push for a little more diuresis.

## 2014-08-20 NOTE — Assessment & Plan Note (Signed)
She had significant bilateral pulmonary emboli in June, 2015. She is anticoagulated. This will be continued. Most likely I will continue this indefinitely.

## 2014-08-20 NOTE — Assessment & Plan Note (Signed)
Xarelto is being continued at this time.

## 2014-08-20 NOTE — Assessment & Plan Note (Signed)
Patient had right ventricular dysfunction and pulmonary hypertension at that time her echo related to her pulmonary emboli. It is now time to repeat this to reassess LV function and RV function and estimated pulmonary artery pressure.

## 2014-08-20 NOTE — Patient Instructions (Signed)
**Note De-identified  Obfuscation** Your physician recommends that you continue on your current medications as directed. Please refer to the Current Medication list given to you today. Your physician has requested that you have an echocardiogram. Echocardiography is a painless test that uses sound waves to create images of your heart. It provides your doctor with information about the size and shape of your heart and how well your heart's chambers and valves are working. This procedure takes approximately one hour. There are no restrictions for this procedure. Your physician recommends that you schedule a follow-up appointment in: 6 weeks. 

## 2014-08-20 NOTE — Progress Notes (Signed)
Patient ID: Ellen Peterson, female   DOB: October 16, 1920, 79 y.o.   MRN: [id number]    HPI  Patient is seen today to followup her history of pulmonary emboli and shortness of breath. She had a large pulmonary emboli diagnosed in June, 2015. The diagnosis was difficult to make originally. She actually underwent cardiac catheterization showing that her coronaries were stable. She went home but came right back to the hospital at which time her pulmonary emboli were diagnosed. She is on appropriate therapy with Xarelto. Her daughters are here with her today and say that she seems to be a little less active over the past month. She says it may be related to some exertional shortness of breath but it is difficult to be sure. She may have had some recent edema. She does not have any definite PND or orthopnea.  Allergies  Allergen Reactions  . Nitrofurantoin Hives  . Tetracycline     REACTION: Rash  . Erythromycin     REACTION: Rash  . Metformin     REACTION: vomiting and diarrhea  . Metronidazole     REACTION: whelps  . Nsaids     REACTION: GI symptoms  . Rofecoxib     REACTION: GI symptoms  . Simvastatin     REACTION: myalgia  . Sulfamethoxazole-Trimethoprim     REACTION: itching    Current Outpatient Prescriptions  Medication Sig Dispense Refill  . acetaminophen (TYLENOL) 500 MG tablet Take 500 mg by mouth every 6 (six) hours as needed (pain).       . cephALEXin (KEFLEX) 250 MG capsule Take 1 capsule (250 mg total) by mouth at bedtime.  30 capsule  5  . cholecalciferol (VITAMIN D) 1000 UNITS tablet Take 1,000 Units by mouth daily.        Marland Kitchen CINNAMON PO Take 1,000 mg by mouth daily.      . diclofenac sodium (VOLTAREN) 1 % GEL Apply 2 g topically 2 (two) times daily as needed (knee pain).       . hydrochlorothiazide (HYDRODIURIL) 25 MG tablet Take 25 mg by mouth daily.      . isosorbide mononitrate (IMDUR) 30 MG 24 hr tablet Take 30 mg by mouth at bedtime.      . metoprolol tartrate (LOPRESSOR)  25 MG tablet Take 1 tablet (25 mg total) by mouth 2 (two) times daily.  60 tablet  3  . Multiple Vitamins-Minerals (ICAPS PO) Take 1 capsule by mouth daily.      . nitroGLYCERIN (NITROSTAT) 0.4 MG SL tablet Place 1 tablet (0.4 mg total) under the tongue every 5 (five) minutes as needed for chest pain. As directed and as needed.  10 tablet  3  . Omega-3 Fatty Acids (FISH OIL) 1000 MG CAPS Take 1,000 mg by mouth daily.      . pantoprazole (PROTONIX) 40 MG tablet Take 40 mg by mouth 2 (two) times daily.      . rivaroxaban (XARELTO) 20 MG TABS tablet Take 1 tablet (20 mg total) by mouth daily with supper. Starting 07/08/14  30 tablet  3  . traMADol (ULTRAM) 50 MG tablet Take 1 tablet (50 mg total) by mouth every 8 (eight) hours as needed (pain).  90 tablet  0  . trolamine salicylate (ASPERCREME) 10 % cream Apply 1 application topically as needed for muscle pain.      . valsartan (DIOVAN) 80 MG tablet Take 1 tablet (80 mg total) by mouth daily.  30 tablet  23  .  vitamin E 400 UNIT capsule Take 400 Units by mouth daily.         No current facility-administered medications for this visit.    History   Social History  . Marital Status: Widowed    Spouse Name: N/A    Number of Children: N/A  . Years of Education: N/A   Occupational History  . Not on file.   Social History Main Topics  . Smoking status: Never Smoker   . Smokeless tobacco: Never Used  . Alcohol Use: No  . Drug Use: No  . Sexual Activity: Not on file   Other Topics Concern  . Not on file   Social History Narrative  . No narrative on file    Family History  Problem Relation Age of Onset  . Heart disease Mother     CAD  . Hypertension Mother   . Stroke Mother   . Cancer Sister     pancreatic CA  . Diabetes Brother     Past Medical History  Diagnosis Date  . Diabetes mellitus     type II  . Diverticulosis of colon   . GERD (gastroesophageal reflux disease)   . Hyperlipidemia   . Hypertension   . Arthritis       OA  . Peripheral vascular disease   . PMR (polymyalgia rheumatica)   . Frequent UTI   . Chest tightness     catheterization, 2005, normal coronaries  . Wide-complex tachycardia     SVT in past  . Hypokalemia   . LBBB (left bundle branch block)     Since at least 2005  . Ejection fraction < 50%     EF 40% echo, 09/2009  . Complication of anesthesia   . PONV (postoperative nausea and vomiting)   . PE (pulmonary embolism) 06/16/2014  . Shortness of breath   . UTI (urinary tract infection)     HX OF RECURRENT UTI'  . Ejection fraction     Past Surgical History  Procedure Laterality Date  . Appendectomy  1993  . Cholecystectomy    . Colectomy  1993    partial  . Abdominal hysterectomy  1960s    fibroids total  . Vein ligation and stripping    . Bladder tack up    . Back surgery    . Esophagogastroduodenoscopy  05/2004  . Bilateral pyelonephritis    . Cardiac catheterization  2005    no significant CAD  . Tonsillectomy      Patient Active Problem List   Diagnosis Date Noted  . DVT (deep venous thrombosis) 07/23/2014  . CAD (coronary artery disease), native coronary artery 07/23/2014  . Pulmonary hypertension 07/23/2014  . Right ventricular dysfunction 07/23/2014  . HX: anticoagulation 07/23/2014  . Dysphasia 07/23/2014  . Ejection fraction   . Essential hypertension, benign 06/22/2014  . UI (urinary incontinence) 06/22/2014  . GERD (gastroesophageal reflux disease) 06/22/2014  . Physical deconditioning 06/22/2014  . Bilateral pulmonary embolism 06/15/2014  . Encounter for Medicare annual wellness exam 08/08/2013  . LBBB (left bundle branch block)   . Hyperlipidemia   . Wide-complex tachycardia   . Venous insufficiency 06/03/2011  . HYPERGLYCEMIA 11/17/2010  . UNSPECIFIED ANEMIA 02/08/2010  . RENAL INSUFFICIENCY 10/27/2009  . UTI'S, RECURRENT 09/22/2008  . UNSPECIFIED VITAMIN D DEFICIENCY 09/10/2008  . COLONIC POLYPS, ADENOMATOUS 04/25/2007  . PERIPHERAL  VASCULAR DISEASE 04/25/2007  . GERD 04/25/2007  . DIVERTICULOSIS, COLON 04/25/2007  . IBS 04/25/2007  . OSTEOARTHRITIS 04/25/2007  .  POLYMYALGIA RHEUMATICA 04/25/2007    ROS   Patient denies fever, chills, headache, sweats, rash, change in vision, change in hearing, chest pain, cough, nausea vomiting, urinary symptoms. All other systems are reviewed and are negative.  PHYSICAL EXAM  Patient is oriented to person time and place. She is frail. She is here with her 2 daughters. Head is atraumatic. There is no jugulovenous distention. Lungs are clear. Respiratory effort is not labored. Cardiac exam reveals an S1 and S2. The abdomen is soft. There is a question of trace peripheral edema.  Filed Vitals:   08/20/14 1000  BP: 148/70  Pulse: 62  Height: 5' (1.524 m)  Weight: 150 lb 6.4 oz (68.221 kg)  SpO2: 98%     ASSESSMENT & PLAN

## 2014-08-26 ENCOUNTER — Telehealth: Payer: Self-pay | Admitting: Cardiology

## 2014-08-26 NOTE — Telephone Encounter (Signed)
Spoke with daughter and advised Dr Ron Parker out of the office but would forward to him as well as billing department.

## 2014-08-26 NOTE — Telephone Encounter (Signed)
New message      Want Dr Ron Parker to know that the patient got another bill.  She said Dr Stephens November knows what she is talking about

## 2014-08-28 ENCOUNTER — Telehealth: Payer: Self-pay | Admitting: Cardiology

## 2014-08-28 ENCOUNTER — Telehealth: Payer: Self-pay | Admitting: *Deleted

## 2014-08-28 ENCOUNTER — Ambulatory Visit (HOSPITAL_COMMUNITY): Payer: Medicare Other | Attending: Cardiovascular Disease

## 2014-08-28 DIAGNOSIS — I2699 Other pulmonary embolism without acute cor pulmonale: Secondary | ICD-10-CM | POA: Diagnosis not present

## 2014-08-28 DIAGNOSIS — R000 Tachycardia, unspecified: Secondary | ICD-10-CM | POA: Insufficient documentation

## 2014-08-28 DIAGNOSIS — I359 Nonrheumatic aortic valve disorder, unspecified: Secondary | ICD-10-CM | POA: Diagnosis not present

## 2014-08-28 DIAGNOSIS — M353 Polymyalgia rheumatica: Secondary | ICD-10-CM | POA: Diagnosis not present

## 2014-08-28 DIAGNOSIS — R0609 Other forms of dyspnea: Secondary | ICD-10-CM | POA: Diagnosis not present

## 2014-08-28 DIAGNOSIS — E785 Hyperlipidemia, unspecified: Secondary | ICD-10-CM | POA: Diagnosis not present

## 2014-08-28 DIAGNOSIS — I519 Heart disease, unspecified: Secondary | ICD-10-CM | POA: Insufficient documentation

## 2014-08-28 DIAGNOSIS — I447 Left bundle-branch block, unspecified: Secondary | ICD-10-CM | POA: Insufficient documentation

## 2014-08-28 DIAGNOSIS — E119 Type 2 diabetes mellitus without complications: Secondary | ICD-10-CM | POA: Insufficient documentation

## 2014-08-28 DIAGNOSIS — I10 Essential (primary) hypertension: Secondary | ICD-10-CM | POA: Insufficient documentation

## 2014-08-28 DIAGNOSIS — I739 Peripheral vascular disease, unspecified: Secondary | ICD-10-CM | POA: Insufficient documentation

## 2014-08-28 DIAGNOSIS — R0989 Other specified symptoms and signs involving the circulatory and respiratory systems: Secondary | ICD-10-CM | POA: Insufficient documentation

## 2014-08-28 DIAGNOSIS — M129 Arthropathy, unspecified: Secondary | ICD-10-CM | POA: Diagnosis not present

## 2014-08-28 DIAGNOSIS — K219 Gastro-esophageal reflux disease without esophagitis: Secondary | ICD-10-CM | POA: Insufficient documentation

## 2014-08-28 DIAGNOSIS — E876 Hypokalemia: Secondary | ICD-10-CM | POA: Diagnosis not present

## 2014-08-28 DIAGNOSIS — I079 Rheumatic tricuspid valve disease, unspecified: Secondary | ICD-10-CM | POA: Diagnosis not present

## 2014-08-28 DIAGNOSIS — I059 Rheumatic mitral valve disease, unspecified: Secondary | ICD-10-CM | POA: Diagnosis not present

## 2014-08-28 DIAGNOSIS — I517 Cardiomegaly: Secondary | ICD-10-CM | POA: Diagnosis not present

## 2014-08-28 DIAGNOSIS — I379 Nonrheumatic pulmonary valve disorder, unspecified: Secondary | ICD-10-CM | POA: Diagnosis not present

## 2014-08-28 NOTE — Telephone Encounter (Signed)
Walk In pt Form " Bill Dropped Off"  Ellen Peterson back on Friday Will give to Her then 9.10.15/km

## 2014-08-28 NOTE — Telephone Encounter (Signed)
Xarelto samples given to patient.

## 2014-08-28 NOTE — Progress Notes (Signed)
2D Echo completed. 08/28/2014

## 2014-09-09 ENCOUNTER — Telehealth: Payer: Self-pay | Admitting: Cardiology

## 2014-09-09 NOTE — Telephone Encounter (Signed)
°  Pt's daughter is calling in regards to Isorbide, it is helping with chest pains at night. Its not helping with chest pains during the day. Daughter wants to know if the can spilt med's in half 1/2 in the morning and 1/2 in the PM. Please call and advise.

## 2014-09-09 NOTE — Telephone Encounter (Signed)
Daughter calling wanting to know results of Echo. Appears Echo has been read but no comments by Dr. Ron Parker. She also wanted to know if she could split the Isosorbide for her mother.  States she takes 30 mg Isosorbide at HS.  Helps with CP at night but not during the day. Spoke w/Sally Hedy Camara who states she may split to take 15 mg BID.  Advised will send Dr. Ron Parker message regarding him reading the Echo.

## 2014-09-11 ENCOUNTER — Encounter: Payer: Self-pay | Admitting: Cardiology

## 2014-09-11 NOTE — Telephone Encounter (Signed)
The pts daughter, Vaughan Basta, is advised and she verbalized understanding.  Also, Vaughan Basta is requesting the results of the pts Echo that was done on 08/28/14. Please advise.

## 2014-09-11 NOTE — Telephone Encounter (Signed)
Please apologize to the patient's daughter about my delay in reporting the echo. I had reviewed it previously and decided not to change her medicines. However I was delayed in contacting her while I was looking further into her hospital bills. The echo test shows that her right ventricular function has improved significantly. This shows that she has recovered nicely from her pulmonary embolus. Please ask how her shortness of breath is doing. My plan at this time would be to not change her diuretic unless she is having significant shortness of breath or edema. Also, I am still talking to the hospital billing team to gather more information about her mother's bill.

## 2014-09-11 NOTE — Telephone Encounter (Signed)
**Note De-identified  Obfuscation** LMTCB

## 2014-09-11 NOTE — Telephone Encounter (Signed)
Please let the patient's daughter know that I have the billing information that she left for me. I am looking in to what I can find out.

## 2014-09-12 NOTE — Telephone Encounter (Signed)
**Note De-Identified  Obfuscation** The pts daughter is advised of the pts Echo results and she verbalized understanding. She states that the pt has very mild edema in her feet and that she has SOB with exertion but that she does not exert herself very often.  Vaughan Basta expressed much gratitude towards Dr Ron Parker for helping them with the pts hospital bill. Note forwarded to Dr Ron Parker.

## 2014-09-24 ENCOUNTER — Ambulatory Visit (INDEPENDENT_AMBULATORY_CARE_PROVIDER_SITE_OTHER): Payer: Medicare Other

## 2014-09-24 DIAGNOSIS — Z23 Encounter for immunization: Secondary | ICD-10-CM

## 2014-10-01 ENCOUNTER — Encounter: Payer: Self-pay | Admitting: Cardiology

## 2014-10-01 ENCOUNTER — Ambulatory Visit (INDEPENDENT_AMBULATORY_CARE_PROVIDER_SITE_OTHER): Payer: Medicare Other | Admitting: Cardiology

## 2014-10-01 VITALS — BP 140/62 | HR 65 | Ht 60.0 in | Wt 146.8 lb

## 2014-10-01 DIAGNOSIS — R0989 Other specified symptoms and signs involving the circulatory and respiratory systems: Secondary | ICD-10-CM

## 2014-10-01 DIAGNOSIS — I2699 Other pulmonary embolism without acute cor pulmonale: Secondary | ICD-10-CM

## 2014-10-01 DIAGNOSIS — R0602 Shortness of breath: Secondary | ICD-10-CM

## 2014-10-01 DIAGNOSIS — R9430 Abnormal result of cardiovascular function study, unspecified: Secondary | ICD-10-CM

## 2014-10-01 NOTE — Assessment & Plan Note (Signed)
Her recent followup echo shows an ejection fraction of 50-55%. She does have septal dyssynergy from left bundle branch block. She had improvement in her right ventricular function.

## 2014-10-01 NOTE — Assessment & Plan Note (Addendum)
Patient had very significant bilateral pulmonary emboli in June, 2015. This was associated with deep venous thrombosis. However she had not had any interventions increasing her tendency toward deep venous thrombosis.. With this in mind, we will most probably continue her anticoagulation indefinitely. Certainly she will remain on her Xarelto for now.

## 2014-10-01 NOTE — Progress Notes (Signed)
Patient ID: Ellen Peterson, female   DOB: 07/16/1920, 80 y.o.   MRN: [id number]    HPI  Patient is seen today to followup for history of pulmonary emboli and shortness of breath. She had very significant pulmonary emboli in June, 2015. Originally the diagnosis was difficult to make. She actually underwent cardiac catheterization and went home and then came back within 24 hours for reassessment. It was at that time that the diagnosis of pulmonary emboli was made. Fortunately she's done very well. I have been in contact with the hospital billing system. After very careful consideration, adjustments were made so that she received only one co-pay for the hospitalizations that she had. This was very appropriate in the family is appreciative.  When I saw her last August 20, 2014, decision was made to do a followup echo. Fortunately this shows excellent LV function. Her right ventricular function had returned to normal. I decided not to adjust her medicines for her shortness of breath. Her shortness of breath now is only intermittent. It does not appear to be related to volume overload.    Allergies  Allergen Reactions  . Nitrofurantoin Hives  . Tetracycline     REACTION: Rash  . Erythromycin     REACTION: Rash  . Metformin     REACTION: vomiting and diarrhea  . Metronidazole     REACTION: whelps  . Nsaids     REACTION: GI symptoms  . Rofecoxib     REACTION: GI symptoms  . Simvastatin     REACTION: myalgia  . Sulfamethoxazole-Trimethoprim     REACTION: itching    Current Outpatient Prescriptions  Medication Sig Dispense Refill  . acetaminophen (TYLENOL) 500 MG tablet Take 500 mg by mouth every 6 (six) hours as needed (pain).       . cephALEXin (KEFLEX) 250 MG capsule Take 1 capsule (250 mg total) by mouth at bedtime.  30 capsule  5  . cholecalciferol (VITAMIN D) 1000 UNITS tablet Take 1,000 Units by mouth daily.        Marland Kitchen CINNAMON PO Take 1,000 mg by mouth daily.      . diclofenac sodium  (VOLTAREN) 1 % GEL Apply 2 g topically 2 (two) times daily as needed (knee pain).       . hydrochlorothiazide (HYDRODIURIL) 25 MG tablet Take 25 mg by mouth daily.      . isosorbide mononitrate (IMDUR) 30 MG 24 hr tablet Take 30 mg by mouth at bedtime.      . metoprolol tartrate (LOPRESSOR) 25 MG tablet Take 1 tablet (25 mg total) by mouth 2 (two) times daily.  60 tablet  3  . Multiple Vitamins-Minerals (ICAPS PO) Take 1 capsule by mouth daily.      . nitroGLYCERIN (NITROSTAT) 0.4 MG SL tablet Place 1 tablet (0.4 mg total) under the tongue every 5 (five) minutes as needed for chest pain. As directed and as needed.  10 tablet  3  . Omega-3 Fatty Acids (FISH OIL) 1000 MG CAPS Take 1,000 mg by mouth daily.      . pantoprazole (PROTONIX) 40 MG tablet Take 40 mg by mouth 2 (two) times daily.      . rivaroxaban (XARELTO) 20 MG TABS tablet Take 1 tablet (20 mg total) by mouth daily with supper. Starting 07/08/14  30 tablet  3  . traMADol (ULTRAM) 50 MG tablet Take 1 tablet (50 mg total) by mouth every 8 (eight) hours as needed (pain).  90 tablet  0  .  trolamine salicylate (ASPERCREME) 10 % cream Apply 1 application topically as needed for muscle pain.      . valsartan (DIOVAN) 80 MG tablet Take 1 tablet (80 mg total) by mouth daily.  30 tablet  23  . vitamin E 400 UNIT capsule Take 400 Units by mouth daily.         No current facility-administered medications for this visit.    History   Social History  . Marital Status: Widowed    Spouse Name: N/A    Number of Children: N/A  . Years of Education: N/A   Occupational History  . Not on file.   Social History Main Topics  . Smoking status: Never Smoker   . Smokeless tobacco: Never Used  . Alcohol Use: No  . Drug Use: No  . Sexual Activity: Not on file   Other Topics Concern  . Not on file   Social History Narrative  . No narrative on file    Family History  Problem Relation Age of Onset  . Heart disease Mother     CAD  .  Hypertension Mother   . Stroke Mother   . Cancer Sister     pancreatic CA  . Diabetes Brother     Past Medical History  Diagnosis Date  . Diabetes mellitus     type II  . Diverticulosis of colon   . GERD (gastroesophageal reflux disease)   . Hyperlipidemia   . Hypertension   . Arthritis     OA  . Peripheral vascular disease   . PMR (polymyalgia rheumatica)   . Frequent UTI   . Chest tightness     catheterization, 2005, normal coronaries  . Wide-complex tachycardia     SVT in past  . Hypokalemia   . LBBB (left bundle branch block)     Since at least 2005  . Ejection fraction < 50%     EF 40% echo, 09/2009  . Complication of anesthesia   . PONV (postoperative nausea and vomiting)   . PE (pulmonary embolism) 06/16/2014  . Shortness of breath   . UTI (urinary tract infection)     HX OF RECURRENT UTI'  . Ejection fraction     Past Surgical History  Procedure Laterality Date  . Appendectomy  1993  . Cholecystectomy    . Colectomy  1993    partial  . Abdominal hysterectomy  1960s    fibroids total  . Vein ligation and stripping    . Bladder tack up    . Back surgery    . Esophagogastroduodenoscopy  05/2004  . Bilateral pyelonephritis    . Cardiac catheterization  2005    no significant CAD  . Tonsillectomy      Patient Active Problem List   Diagnosis Date Noted  . Shortness of breath 08/20/2014  . DVT (deep venous thrombosis) 07/23/2014  . CAD (coronary artery disease), native coronary artery 07/23/2014  . Pulmonary hypertension 07/23/2014  . Right ventricular dysfunction 07/23/2014  . HX: anticoagulation 07/23/2014  . Dysphasia 07/23/2014  . Ejection fraction   . Essential hypertension, benign 06/22/2014  . UI (urinary incontinence) 06/22/2014  . GERD (gastroesophageal reflux disease) 06/22/2014  . Physical deconditioning 06/22/2014  . Bilateral pulmonary embolism 06/15/2014  . Encounter for Medicare annual wellness exam 08/08/2013  . LBBB (left bundle  branch block)   . Hyperlipidemia   . Wide-complex tachycardia   . Venous insufficiency 06/03/2011  . HYPERGLYCEMIA 11/17/2010  . UNSPECIFIED ANEMIA 02/08/2010  .  RENAL INSUFFICIENCY 10/27/2009  . UTI'S, RECURRENT 09/22/2008  . UNSPECIFIED VITAMIN D DEFICIENCY 09/10/2008  . COLONIC POLYPS, ADENOMATOUS 04/25/2007  . PERIPHERAL VASCULAR DISEASE 04/25/2007  . DIVERTICULOSIS, COLON 04/25/2007  . IBS 04/25/2007  . OSTEOARTHRITIS 04/25/2007  . POLYMYALGIA RHEUMATICA 04/25/2007    ROS   Patient denies fever, chills, headache, sweats, rash, change in vision, change in hearing, chest pain, cough, nausea vomiting, urinary symptoms. All other systems are reviewed and are negative.  PHYSICAL EXAM   She walks with a rolling walker. She is here with her 2 daughters. She is oriented to person time and place. Affect is normal. Head is atraumatic. Sclera and conjunctiva are normal. Lungs are clear. Respiratory effort is nonlabored. She has kyphosis of the thoracic spine. Cardiac exam reveals S1 and S2. Abdomen is soft. There is no peripheral edema.  Filed Vitals:   10/01/14 0904  BP: 140/62  Pulse: 65  Height: 5' (1.524 m)  Weight: 146 lb 12.8 oz (66.588 kg)  SpO2: 99%    ASSESSMENT & PLAN

## 2014-10-01 NOTE — Assessment & Plan Note (Signed)
She has some mild ongoing intermittent shortness of breath. This does not appear to be related to volume overload. No further workup.

## 2014-10-01 NOTE — Patient Instructions (Signed)
**Note De-identified  Obfuscation** Your physician recommends that you continue on your current medications as directed. Please refer to the Current Medication list given to you today.  Your physician wants you to follow-up in: 6 months. You will receive a reminder letter in the mail two months in advance. If you don't receive a letter, please call our office to schedule the follow-up appointment.  

## 2014-10-06 ENCOUNTER — Other Ambulatory Visit: Payer: Self-pay | Admitting: Family Medicine

## 2014-10-06 NOTE — Telephone Encounter (Signed)
Rx called in as prescribed 

## 2014-10-06 NOTE — Telephone Encounter (Signed)
Px written for call in   

## 2014-10-06 NOTE — Telephone Encounter (Signed)
Electronic refill request, please advise  

## 2014-11-04 ENCOUNTER — Other Ambulatory Visit: Payer: Self-pay | Admitting: Family Medicine

## 2014-11-11 ENCOUNTER — Telehealth: Payer: Self-pay | Admitting: *Deleted

## 2014-11-11 NOTE — Telephone Encounter (Signed)
Xarelto samples placed at the front desk for patient. 

## 2014-11-17 ENCOUNTER — Other Ambulatory Visit: Payer: Self-pay | Admitting: Family Medicine

## 2014-11-17 NOTE — Telephone Encounter (Signed)
Received refill request electronically from pharmacy. Last refill 10/06/14 #90, last office visit 07/11/14. Is it okay to refill medication?

## 2014-11-17 NOTE — Telephone Encounter (Signed)
Rx called in as prescribed 

## 2014-11-17 NOTE — Telephone Encounter (Signed)
Px written for call in   

## 2014-11-27 ENCOUNTER — Encounter (HOSPITAL_COMMUNITY): Payer: Self-pay | Admitting: Cardiology

## 2014-12-16 ENCOUNTER — Telehealth: Payer: Self-pay

## 2014-12-16 NOTE — Telephone Encounter (Signed)
Call patient' s  Daughter to let her know that i placed  Samples of xarelto at the front desk

## 2014-12-29 ENCOUNTER — Telehealth: Payer: Self-pay | Admitting: Family Medicine

## 2014-12-29 DIAGNOSIS — D649 Anemia, unspecified: Secondary | ICD-10-CM

## 2014-12-29 DIAGNOSIS — N289 Disorder of kidney and ureter, unspecified: Secondary | ICD-10-CM

## 2014-12-29 DIAGNOSIS — E785 Hyperlipidemia, unspecified: Secondary | ICD-10-CM

## 2014-12-29 DIAGNOSIS — E559 Vitamin D deficiency, unspecified: Secondary | ICD-10-CM

## 2014-12-29 NOTE — Telephone Encounter (Signed)
-----   Message from Ellamae Sia sent at 12/24/2014  3:28 PM EST ----- Regarding: Lab orders for Tuesday, 1.12.16 Patient is scheduled for CPX labs, please order future labs, Thanks , Karna Christmas

## 2014-12-30 ENCOUNTER — Other Ambulatory Visit (INDEPENDENT_AMBULATORY_CARE_PROVIDER_SITE_OTHER): Payer: Medicare Other

## 2014-12-30 ENCOUNTER — Other Ambulatory Visit: Payer: Self-pay | Admitting: Family Medicine

## 2014-12-30 ENCOUNTER — Encounter: Payer: Self-pay | Admitting: Radiology

## 2014-12-30 DIAGNOSIS — D649 Anemia, unspecified: Secondary | ICD-10-CM

## 2014-12-30 DIAGNOSIS — E559 Vitamin D deficiency, unspecified: Secondary | ICD-10-CM

## 2014-12-30 DIAGNOSIS — N289 Disorder of kidney and ureter, unspecified: Secondary | ICD-10-CM | POA: Diagnosis not present

## 2014-12-30 DIAGNOSIS — E785 Hyperlipidemia, unspecified: Secondary | ICD-10-CM | POA: Diagnosis not present

## 2014-12-30 LAB — COMPREHENSIVE METABOLIC PANEL
ALBUMIN: 3.7 g/dL (ref 3.5–5.2)
ALK PHOS: 48 U/L (ref 39–117)
ALT: 16 U/L (ref 0–35)
AST: 24 U/L (ref 0–37)
BUN: 41 mg/dL — ABNORMAL HIGH (ref 6–23)
CO2: 25 mEq/L (ref 19–32)
Calcium: 9.1 mg/dL (ref 8.4–10.5)
Chloride: 106 mEq/L (ref 96–112)
Creatinine, Ser: 1.3 mg/dL — ABNORMAL HIGH (ref 0.4–1.2)
GFR: 41.82 mL/min — ABNORMAL LOW (ref >60.00)
GLUCOSE: 102 mg/dL — AB (ref 70–99)
POTASSIUM: 4.3 meq/L (ref 3.5–5.1)
SODIUM: 138 meq/L (ref 135–145)
Total Bilirubin: 0.8 mg/dL (ref 0.2–1.2)
Total Protein: 6.8 g/dL (ref 6.0–8.3)

## 2014-12-30 LAB — LIPID PANEL
CHOL/HDL RATIO: 4
CHOLESTEROL: 237 mg/dL — AB (ref 0–200)
HDL: 53.4 mg/dL (ref >39.00)
LDL Cholesterol: 160 mg/dL — ABNORMAL HIGH (ref 0–99)
NonHDL: 183.6
TRIGLYCERIDES: 116 mg/dL (ref 0.0–149.0)
VLDL: 23.2 mg/dL (ref 0.0–40.0)

## 2014-12-30 LAB — CBC WITH DIFFERENTIAL/PLATELET
BASOS PCT: 0.4 % (ref 0.0–3.0)
Basophils Absolute: 0 10*3/uL (ref 0.0–0.1)
EOS ABS: 0.2 10*3/uL (ref 0.0–0.7)
Eosinophils Relative: 2.8 % (ref 0.0–5.0)
HEMATOCRIT: 36 % (ref 36.0–46.0)
Hemoglobin: 11.8 g/dL — ABNORMAL LOW (ref 12.0–15.0)
LYMPHS PCT: 25.5 % (ref 12.0–46.0)
Lymphs Abs: 2 10*3/uL (ref 0.7–4.0)
MCHC: 32.8 g/dL (ref 30.0–36.0)
MCV: 93.7 fl (ref 78.0–100.0)
Monocytes Absolute: 0.8 10*3/uL (ref 0.1–1.0)
Monocytes Relative: 10.6 % (ref 3.0–12.0)
NEUTROS ABS: 4.7 10*3/uL (ref 1.4–7.7)
Neutrophils Relative %: 60.7 % (ref 43.0–77.0)
Platelets: 277 10*3/uL (ref 150.0–400.0)
RBC: 3.85 Mil/uL — AB (ref 3.87–5.11)
RDW: 14.3 % (ref 11.5–15.5)
WBC: 7.7 10*3/uL (ref 4.0–10.5)

## 2014-12-30 LAB — VITAMIN D 25 HYDROXY (VIT D DEFICIENCY, FRACTURES): VITD: 26.92 ng/mL — AB (ref 30.00–100.00)

## 2014-12-30 LAB — TSH: TSH: 2.6 u[IU]/mL (ref 0.35–4.50)

## 2015-01-06 ENCOUNTER — Encounter: Payer: Self-pay | Admitting: Radiology

## 2015-01-06 ENCOUNTER — Encounter: Payer: Self-pay | Admitting: Family Medicine

## 2015-01-06 ENCOUNTER — Ambulatory Visit (INDEPENDENT_AMBULATORY_CARE_PROVIDER_SITE_OTHER): Payer: Medicare Other | Admitting: Family Medicine

## 2015-01-06 VITALS — BP 140/62 | HR 81 | Temp 98.5°F | Ht 60.0 in | Wt 150.9 lb

## 2015-01-06 DIAGNOSIS — R739 Hyperglycemia, unspecified: Secondary | ICD-10-CM | POA: Diagnosis not present

## 2015-01-06 DIAGNOSIS — I10 Essential (primary) hypertension: Secondary | ICD-10-CM

## 2015-01-06 DIAGNOSIS — E785 Hyperlipidemia, unspecified: Secondary | ICD-10-CM

## 2015-01-06 DIAGNOSIS — Z0000 Encounter for general adult medical examination without abnormal findings: Secondary | ICD-10-CM

## 2015-01-06 DIAGNOSIS — N289 Disorder of kidney and ureter, unspecified: Secondary | ICD-10-CM | POA: Diagnosis not present

## 2015-01-06 DIAGNOSIS — M1990 Unspecified osteoarthritis, unspecified site: Secondary | ICD-10-CM

## 2015-01-06 DIAGNOSIS — Z23 Encounter for immunization: Secondary | ICD-10-CM | POA: Diagnosis not present

## 2015-01-06 DIAGNOSIS — E559 Vitamin D deficiency, unspecified: Secondary | ICD-10-CM

## 2015-01-06 MED ORDER — PANTOPRAZOLE SODIUM 40 MG PO TBEC: 40.0000 mg | DELAYED_RELEASE_TABLET | Freq: Two times a day (BID) | ORAL | Status: DC

## 2015-01-06 MED ORDER — CEPHALEXIN 250 MG PO CAPS: 250.0000 mg | ORAL_CAPSULE | Freq: Every day | ORAL | Status: DC

## 2015-01-06 MED ORDER — TRAMADOL HCL 50 MG PO TABS: ORAL_TABLET | ORAL | Status: DC

## 2015-01-06 MED ORDER — DICLOFENAC SODIUM 1 % TD GEL: 2.0000 g | Freq: Two times a day (BID) | TRANSDERMAL | Status: DC | PRN

## 2015-01-06 NOTE — Progress Notes (Signed)
Subjective:    Patient ID: Ellen Peterson, female    DOB: Jan 27, 1920, 81 y.o.   MRN: [id number]  HPI Here for annual medicare wellness visit and also acute/chronic medical issues   I have personally reviewed the Medicare Annual Wellness questionnaire and have noted 1. The patient's medical and social history 2. Their use of alcohol, tobacco or illicit drugs 3. Their current medications and supplements 4. The patient's functional ability including ADL's, fall risks, home safety risks and hearing or visual             impairment. 5. Diet and physical activities 6. Evidence for depression or mood disorders  The patients weight, height, BMI have been recorded in the chart and visual acuity is per eye clinic.  I have made referrals, counseling and provided education to the patient based review of the above and I have provided the pt with a written personalized care plan for preventive services.  Pt states she is doing "great"   Still wakes up with dizziness occ - claritin helps  Uses a walker- very careful with that   Wt is up 4 lb with bmi of 29   See scanned forms.  Routine anticipatory guidance given to patient.  See health maintenance. Colon cancer screening - does not want them at her age  Breast cancer screening - declines at her age  Self breast exam-no lumps or change  Flu vaccine 10/15 Tetanus vaccine - is overdue - she may be around babies in the future / wants to get it here even if it is not covered  Pneumovax 1/01 , will get prevnar today  Zoster vaccine - may be interested in the future if covered   Advance directive has a living will and POA  Cognitive function addressed- see scanned forms- and if abnormal then additional documentation follows.  Memory is wonderful - does well   PMH and SH reviewed  Meds, vitals, and allergies reviewed.   ROS: See HPI.  Otherwise negative.     Hyperlipidemia Lab Results  Component Value Date   CHOL 237* 12/30/2014   CHOL  207* 06/12/2014   CHOL 218* 07/31/2013   Lab Results  Component Value Date   HDL 53.40 12/30/2014   HDL 64 06/12/2014   HDL 60.20 07/31/2013   Lab Results  Component Value Date   LDLCALC 160* 12/30/2014   LDLCALC 123* 06/12/2014   LDLCALC  10/16/2009    79        Total Cholesterol/HDL:CHD Risk Coronary Heart Disease Risk Table                     Men   Women  1/2 Average Risk   3.4   3.3  Average Risk       5.0   4.4  2 X Average Risk   9.6   7.1  3 X Average Risk  23.4   11.0        Use the calculated Patient Ratio above and the CHD Risk Table to determine the patient's CHD Risk.        ATP III CLASSIFICATION (LDL):  <100     mg/dL   Optimal  100-129  mg/dL   Near or Above                    Optimal  130-159  mg/dL   Borderline  160-189  mg/dL   High  >190     mg/dL  Very High   Lab Results  Component Value Date   TRIG 116.0 12/30/2014   TRIG 102 06/12/2014   TRIG 117.0 07/31/2013   Lab Results  Component Value Date   CHOLHDL 4 12/30/2014   CHOLHDL 3.2 06/12/2014   CHOLHDL 4 07/31/2013   Lab Results  Component Value Date   LDLDIRECT 149.0 07/31/2013   LDLDIRECT 142.2 02/04/2013   LDLDIRECT 130.0 03/25/2010   intol of all meds for chol  Some fatty foods   Renal insuff Lab Results  Component Value Date   CREATININE 1.3* 12/30/2014   BUN 41* 12/30/2014   NA 138 12/30/2014   K 4.3 12/30/2014   CL 106 12/30/2014   CO2 25 12/30/2014   Cr is up  Thinks this is due to less water intake   Hx of D def Level is 26  Hyperglycemia Fasting gluc 102  Patient Active Problem List   Diagnosis Date Noted  . Shortness of breath 08/20/2014  . DVT (deep venous thrombosis) 07/23/2014  . CAD (coronary artery disease), native coronary artery 07/23/2014  . Pulmonary hypertension 07/23/2014  . Right ventricular dysfunction 07/23/2014  . HX: anticoagulation 07/23/2014  . Dysphasia 07/23/2014  . Ejection fraction   . Essential hypertension, benign 06/22/2014    . UI (urinary incontinence) 06/22/2014  . GERD (gastroesophageal reflux disease) 06/22/2014  . Physical deconditioning 06/22/2014  . Bilateral pulmonary embolism 06/15/2014  . Encounter for Medicare annual wellness exam 08/08/2013  . LBBB (left bundle branch block)   . Hyperlipidemia   . Wide-complex tachycardia   . Venous insufficiency 06/03/2011  . Hyperglycemia 11/17/2010  . Anemia 02/08/2010  . Renal insufficiency 10/27/2009  . UTI'S, RECURRENT 09/22/2008  . Vitamin D deficiency 09/10/2008  . COLONIC POLYPS, ADENOMATOUS 04/25/2007  . PERIPHERAL VASCULAR DISEASE 04/25/2007  . DIVERTICULOSIS, COLON 04/25/2007  . IBS 04/25/2007  . OSTEOARTHRITIS 04/25/2007  . POLYMYALGIA RHEUMATICA 04/25/2007   Past Medical History  Diagnosis Date  . Diabetes mellitus     type II  . Diverticulosis of colon   . GERD (gastroesophageal reflux disease)   . Hyperlipidemia   . Hypertension   . Arthritis     OA  . Peripheral vascular disease   . PMR (polymyalgia rheumatica)   . Frequent UTI   . Chest tightness     catheterization, 2005, normal coronaries  . Wide-complex tachycardia     SVT in past  . Hypokalemia   . LBBB (left bundle branch block)     Since at least 2005  . Ejection fraction < 50%     EF 40% echo, 09/2009  . Complication of anesthesia   . PONV (postoperative nausea and vomiting)   . PE (pulmonary embolism) 06/16/2014  . Shortness of breath   . UTI (urinary tract infection)     HX OF RECURRENT UTI'  . Ejection fraction    Past Surgical History  Procedure Laterality Date  . Appendectomy  1993  . Cholecystectomy    . Colectomy  1993    partial  . Abdominal hysterectomy  1960s    fibroids total  . Vein ligation and stripping    . Bladder tack up    . Back surgery    . Esophagogastroduodenoscopy  05/2004  . Bilateral pyelonephritis    . Cardiac catheterization  2005    no significant CAD  . Tonsillectomy    . Left heart catheterization with coronary angiogram  N/A 06/12/2014    Procedure: LEFT HEART CATHETERIZATION WITH CORONARY ANGIOGRAM;  Surgeon: Peter M Martinique, MD;  Location: [facility] CATH LAB;  Service: Cardiovascular;  Laterality: N/A;   History  Substance Use Topics  . Smoking status: Never Smoker   . Smokeless tobacco: Never Used  . Alcohol Use: No   Family History  Problem Relation Age of Onset  . Heart disease Mother     CAD  . Hypertension Mother   . Stroke Mother   . Cancer Sister     pancreatic CA  . Diabetes Brother    Allergies  Allergen Reactions  . Nitrofurantoin Hives  . Tetracycline     REACTION: Rash  . Erythromycin     REACTION: Rash  . Metformin     REACTION: vomiting and diarrhea  . Metronidazole     REACTION: whelps  . Nsaids     REACTION: GI symptoms  . Rofecoxib     REACTION: GI symptoms  . Simvastatin     REACTION: myalgia  . Sulfamethoxazole-Trimethoprim     REACTION: itching   Current Outpatient Prescriptions on File Prior to Visit  Medication Sig Dispense Refill  . acetaminophen (TYLENOL) 500 MG tablet Take 500 mg by mouth every 6 (six) hours as needed (pain).     . cephALEXin (KEFLEX) 250 MG capsule Take 1 capsule (250 mg total) by mouth at bedtime. 30 capsule 5  . cholecalciferol (VITAMIN D) 1000 UNITS tablet Take 1,000 Units by mouth daily.      Marland Kitchen CINNAMON PO Take 1,000 mg by mouth daily.    . diclofenac sodium (VOLTAREN) 1 % GEL Apply 2 g topically 2 (two) times daily as needed (knee pain).     . hydrochlorothiazide (HYDRODIURIL) 25 MG tablet Take 25 mg by mouth daily.    . isosorbide mononitrate (IMDUR) 30 MG 24 hr tablet Take 30 mg by mouth at bedtime.    . metoprolol tartrate (LOPRESSOR) 25 MG tablet Take 1 tablet (25 mg total) by mouth 2 (two) times daily. 60 tablet 3  . Multiple Vitamins-Minerals (ICAPS PO) Take 1 capsule by mouth daily.    . nitroGLYCERIN (NITROSTAT) 0.4 MG SL tablet Place 1 tablet (0.4 mg total) under the tongue every 5 (five) minutes as needed for chest pain. As directed  and as needed. 10 tablet 3  . Omega-3 Fatty Acids (FISH OIL) 1000 MG CAPS Take 1,000 mg by mouth daily.    . pantoprazole (PROTONIX) 40 MG tablet TAKE 1 TABLET BY MOUTH TWICE A DAY 60 tablet 0  . rivaroxaban (XARELTO) 20 MG TABS tablet Take 1 tablet (20 mg total) by mouth daily with supper. Starting 07/08/14 30 tablet 3  . traMADol (ULTRAM) 50 MG tablet TAKE 1 TABLET BY MOUTH EVERY 8 HOURS AS NEEDED FOR PAIN. 90 tablet 3  . trolamine salicylate (ASPERCREME) 10 % cream Apply 1 application topically as needed for muscle pain.    . valsartan (DIOVAN) 80 MG tablet Take 1 tablet (80 mg total) by mouth daily. 30 tablet 23  . valsartan (DIOVAN) 80 MG tablet TAKE 1 TABLET BY MOUTH DAILY 30 tablet 11  . vitamin E 400 UNIT capsule Take 400 Units by mouth daily.       No current facility-administered medications on file prior to visit.      Review of Systems Review of Systems  Constitutional: Negative for fever, appetite change,  and unexpected weight change.  Eyes: Negative for pain and visual disturbance.  Respiratory: Negative for cough and shortness of breath.   Cardiovascular: Negative for cp or palpitations  Gastrointestinal: Negative for nausea, diarrhea and constipation.  Genitourinary: pos for baseline  urgency and frequency. neg for dyrsuria  Skin: Negative for pallor or rash   Neurological: Negative for weakness, light-headedness, numbness and headaches.  Hematological: Negative for adenopathy. Does not bruise/bleed easily.  Psychiatric/Behavioral: Negative for dysphoric mood. The patient is not nervous/anxious today         Objective:   Physical Exam  Constitutional: She appears well-developed and well-nourished. No distress.  overwt and well app- elderly woman with walker   HENT:  Head: Normocephalic and atraumatic.  Right Ear: External ear normal.  Left Ear: External ear normal.  Nose: Nose normal.  Mouth/Throat: Oropharynx is clear and moist.  Eyes: Conjunctivae and EOM  are normal. Pupils are equal, round, and reactive to light. Right eye exhibits no discharge. Left eye exhibits no discharge. No scleral icterus.  Neck: Normal range of motion. Neck supple. No JVD present. No thyromegaly present.  Cardiovascular: Normal rate, regular rhythm, normal heart sounds and intact distal pulses.  Exam reveals no gallop.   Pulmonary/Chest: Effort normal and breath sounds normal. No respiratory distress. She has no wheezes. She has no rales.  Abdominal: Soft. Bowel sounds are normal. She exhibits no distension and no mass. There is no tenderness.  Musculoskeletal: She exhibits no edema or tenderness.  Limited rom knees and hips   Mild kyphosis  Lymphadenopathy:    She has no cervical adenopathy.  Neurological: She is alert. She has normal reflexes. No cranial nerve deficit. She exhibits normal muscle tone. Coordination normal.  Skin: Skin is warm and dry. No rash noted. No erythema. No pallor.  Psychiatric: She has a normal mood and affect.          Assessment & Plan:   Problem List Items Addressed This Visit      Cardiovascular and Mediastinum   Essential hypertension, benign    bp in fair control at this time  BP Readings from Last 1 Encounters:  01/06/15 140/62   No changes needed Disc lifstyle change with low sodium diet and exercise  Labs reviewed  Watching renal fxn      Relevant Medications   cephALEXin (KEFLEX) capsule     Musculoskeletal and Integument   Osteoarthritis    We will take over voltaren gel since she no longer sees ortho  Uses on knees  2 tubes per month  Cannot take oral nsaids  Also tramadol with caution         Genitourinary   Renal insufficiency - Primary    Lab Results  Component Value Date   CREATININE 1.3* 12/30/2014    This is up  Also bun is up  Disc water intake -not enough Outlined plan for better fluid intake Re check at f/u  Disc avoidance of renal toxic meds         Other   Encounter for Medicare  annual wellness exam    Reviewed health habits including diet and exercise and skin cancer prevention Reviewed appropriate screening tests for age  Also reviewed health mt list, fam hx and immunization status , as well as social and family history   See HPI Tdap today (will pay if not covered_) prevnar today Disc zoster vac-will see if covered   Disc low chol diet and inc her D dose to 3000 iu daily  Rev fall precautions       Hyperglycemia    Glucose is stable Disc diet- low glycemic the best she can  Continue  to follow  F/u 6 mo      Hyperlipidemia    Chol is up  Intol to statins/does not want med Disc goals for lipids and reasons to control them Rev labs with pt Rev low sat fat diet in detail         Vitamin D deficiency    D level is low in 20s Will inc daily intake to 3000 iu daily  Continue to follow Disc imp to bone and overall health        Other Visit Diagnoses    Need for pneumococcal vaccination        Relevant Orders    Pneumococcal conjugate vaccine 13-valent IM (Completed)    Need for Tdap vaccination        Relevant Orders    Tdap vaccine greater than or equal to 7yo IM (Completed)

## 2015-01-06 NOTE — Assessment & Plan Note (Addendum)
We will take over voltaren gel since she no longer sees ortho  Uses on knees  2 tubes per month  Cannot take oral nsaids  Also tramadol with caution

## 2015-01-06 NOTE — Assessment & Plan Note (Signed)
Lab Results  Component Value Date   CREATININE 1.3* 12/30/2014    This is up  Also bun is up  Disc water intake -not enough Outlined plan for better fluid intake Re check at f/u  Disc avoidance of renal toxic meds

## 2015-01-06 NOTE — Assessment & Plan Note (Signed)
Chol is up  Intol to statins/does not want med Disc goals for lipids and reasons to control them Rev labs with pt Rev low sat fat diet in detail

## 2015-01-06 NOTE — Assessment & Plan Note (Signed)
Glucose is stable Disc diet- low glycemic the best she can  Continue to follow  F/u 6 mo

## 2015-01-06 NOTE — Assessment & Plan Note (Signed)
D level is low in 20s Will inc daily intake to 3000 iu daily  Continue to follow Disc imp to bone and overall health

## 2015-01-06 NOTE — Assessment & Plan Note (Signed)
Reviewed health habits including diet and exercise and skin cancer prevention Reviewed appropriate screening tests for age  Also reviewed health mt list, fam hx and immunization status , as well as social and family history   See HPI Tdap today (will pay if not covered_) prevnar today Disc zoster vac-will see if covered   Disc low chol diet and inc her D dose to 3000 iu daily  Rev fall precautions

## 2015-01-06 NOTE — Assessment & Plan Note (Signed)
bp in fair control at this time  BP Readings from Last 1 Encounters:  01/06/15 140/62   No changes needed Disc lifstyle change with low sodium diet and exercise  Labs reviewed  Watching renal fxn

## 2015-01-06 NOTE — Patient Instructions (Signed)
Tdap vaccine today  prevnar vaccine today  If you are interested in a shingles/zoster vaccine - call your insurance to check on coverage,( you should not get it within 1 month of other vaccines) , then call us for a prescription  for it to take to a pharmacy that gives the shot , or make a nurse visit to get it here depending on your coverage  Watch diet for cholesterol since it is up  Avoid red meat/ fried foods/ egg yolks/ fatty breakfast meats/ butter, cheese and high fat dairy/ and shellfish    Increase water intake for kidney health - work towards 10 servings per day   Increase vitamin D to 3000 iu daily - for bone and overall health  Follow up in 6 months with labs prior

## 2015-01-07 ENCOUNTER — Telehealth: Payer: Self-pay | Admitting: Family Medicine

## 2015-01-07 NOTE — Telephone Encounter (Signed)
emmi emailed °

## 2015-01-15 ENCOUNTER — Telehealth: Payer: Self-pay | Admitting: *Deleted

## 2015-01-15 NOTE — Telephone Encounter (Signed)
Received a prior auth request from [facility] for Voltaren Gel. I completed prior auth electronically through Cover My Meds using information provided in pt's medical record.  PA request and a copy of PA placed in Shapale's inbox to await response from insurance company.

## 2015-01-19 ENCOUNTER — Telehealth: Payer: Self-pay

## 2015-01-19 NOTE — Telephone Encounter (Signed)
Voltaren gel is approved through 01/16/16 under Medicare Part D

## 2015-01-19 NOTE — Telephone Encounter (Signed)
Patient  Daughter called to get samples of xarelto 20 mg placed samples up front

## 2015-01-22 ENCOUNTER — Telehealth: Payer: Self-pay | Admitting: *Deleted

## 2015-01-22 NOTE — Telephone Encounter (Signed)
Faxed from [facility].  Voltaren 1.000 gel is approved through 01/16/2016.

## 2015-01-28 ENCOUNTER — Ambulatory Visit: Payer: Medicare Other

## 2015-01-29 ENCOUNTER — Ambulatory Visit: Payer: Medicare Other

## 2015-02-02 ENCOUNTER — Other Ambulatory Visit: Payer: Self-pay | Admitting: Family Medicine

## 2015-02-12 ENCOUNTER — Ambulatory Visit (INDEPENDENT_AMBULATORY_CARE_PROVIDER_SITE_OTHER): Payer: Medicare Other | Admitting: *Deleted

## 2015-02-12 DIAGNOSIS — Z23 Encounter for immunization: Secondary | ICD-10-CM

## 2015-02-20 ENCOUNTER — Telehealth: Payer: Self-pay

## 2015-02-20 NOTE — Telephone Encounter (Signed)
Called patient to let her know that I placed samples of xarelto up front for her

## 2015-03-24 ENCOUNTER — Telehealth: Payer: Self-pay

## 2015-03-24 NOTE — Telephone Encounter (Signed)
Patient's daughter called to get samples of xarelto placed them up front

## 2015-03-26 DIAGNOSIS — N3020 Other chronic cystitis without hematuria: Secondary | ICD-10-CM | POA: Diagnosis not present

## 2015-03-26 DIAGNOSIS — N390 Urinary tract infection, site not specified: Secondary | ICD-10-CM | POA: Diagnosis not present

## 2015-04-08 ENCOUNTER — Telehealth: Payer: Self-pay | Admitting: *Deleted

## 2015-04-08 ENCOUNTER — Ambulatory Visit (INDEPENDENT_AMBULATORY_CARE_PROVIDER_SITE_OTHER): Payer: Medicare Other | Admitting: Cardiology

## 2015-04-08 ENCOUNTER — Encounter: Payer: Self-pay | Admitting: Cardiology

## 2015-04-08 VITALS — BP 170/82 | HR 60 | Ht 60.0 in | Wt 156.0 lb

## 2015-04-08 DIAGNOSIS — I2699 Other pulmonary embolism without acute cor pulmonale: Secondary | ICD-10-CM

## 2015-04-08 DIAGNOSIS — R0602 Shortness of breath: Secondary | ICD-10-CM

## 2015-04-08 DIAGNOSIS — I2510 Atherosclerotic heart disease of native coronary artery without angina pectoris: Secondary | ICD-10-CM

## 2015-04-08 DIAGNOSIS — Z9229 Personal history of other drug therapy: Secondary | ICD-10-CM

## 2015-04-08 DIAGNOSIS — Z7901 Long term (current) use of anticoagulants: Secondary | ICD-10-CM

## 2015-04-08 DIAGNOSIS — I5189 Other ill-defined heart diseases: Secondary | ICD-10-CM | POA: Diagnosis not present

## 2015-04-08 DIAGNOSIS — I519 Heart disease, unspecified: Secondary | ICD-10-CM

## 2015-04-08 MED ORDER — RIVAROXABAN 15 MG PO TABS: 15.0000 mg | ORAL_TABLET | Freq: Every day | ORAL | Status: DC

## 2015-04-08 NOTE — Progress Notes (Signed)
Cardiology Office Note   Date:  04/08/2015   ID:  Ellen Peterson, DOB 1920/07/23, MRN [id number]  PCP:  [name], MD  Cardiologist:  Dola Argyle, MD   Chief Complaint  Patient presents with  . Appointment    Follow-up shortness of breath      History of Present Illness: Ellen Peterson is a 81 y.o. female who presents to follow-up history of pulmonary emboli and some shortness of breath. She is here with 2 of her daughters. She has some exertional shortness of breath but she is stable. She had her daughters are aware that I will be retiring. They have requested to see Dr. Meda Coffee going for, as one of her daughters has seen Dr. Meda Coffee. The patient had very significant pulmonary emboli. Fortunately she improved and stabilized. Her right ventricular dysfunction improved and normalized. I have chosen to keep her on anticoagulation.  The patient presented short of breath in June, 2015. At that time cardiac catheterization was done and she went home. She was back in the emergency room within 24 hours with a diagnosis of marked pulmonary emboli. Fortunately she did very well.  Her daughters have been very understanding about the events in 2015. I was able to help have one of her hospital co-pays removed as she was admitted within 24 hours after her catheterization. Her right ventricular dysfunction improved and she is been stable.  Past Medical History  Diagnosis Date  . Diabetes mellitus     type II  . Diverticulosis of colon   . GERD (gastroesophageal reflux disease)   . Hyperlipidemia   . Hypertension   . Arthritis     OA  . Peripheral vascular disease   . PMR (polymyalgia rheumatica)   . Frequent UTI   . Chest tightness     catheterization, 2005, normal coronaries  . Wide-complex tachycardia     SVT in past  . Hypokalemia   . LBBB (left bundle branch block)     Since at least 2005  . Ejection fraction < 50%     EF 40% echo, 09/2009  . Complication of anesthesia   . PONV  (postoperative nausea and vomiting)   . PE (pulmonary embolism) 06/16/2014  . Shortness of breath   . UTI (urinary tract infection)     HX OF RECURRENT UTI'  . Ejection fraction     Past Surgical History  Procedure Laterality Date  . Appendectomy  1993  . Cholecystectomy    . Colectomy  1993    partial  . Abdominal hysterectomy  1960s    fibroids total  . Vein ligation and stripping    . Bladder tack up    . Back surgery    . Esophagogastroduodenoscopy  05/2004  . Bilateral pyelonephritis    . Cardiac catheterization  2005    no significant CAD  . Tonsillectomy    . Left heart catheterization with coronary angiogram N/A 06/12/2014    Procedure: LEFT HEART CATHETERIZATION WITH CORONARY ANGIOGRAM;  Surgeon: Peter M Martinique, MD;  Location: [facility] CATH LAB;  Service: Cardiovascular;  Laterality: N/A;    Patient Active Problem List   Diagnosis Date Noted  . Shortness of breath 08/20/2014  . DVT (deep venous thrombosis) 07/23/2014  . CAD (coronary artery disease), native coronary artery 07/23/2014  . Pulmonary hypertension 07/23/2014  . Right ventricular dysfunction 07/23/2014  . HX: anticoagulation 07/23/2014  . Dysphasia 07/23/2014  . Ejection fraction   . Essential hypertension, benign 06/22/2014  .  UI (urinary incontinence) 06/22/2014  . GERD (gastroesophageal reflux disease) 06/22/2014  . Physical deconditioning 06/22/2014  . Bilateral pulmonary embolism 06/15/2014  . Encounter for Medicare annual wellness exam 08/08/2013  . LBBB (left bundle branch block)   . Hyperlipidemia   . Wide-complex tachycardia   . Venous insufficiency 06/03/2011  . Hyperglycemia 11/17/2010  . Anemia 02/08/2010  . Renal insufficiency 10/27/2009  . UTI'S, RECURRENT 09/22/2008  . Vitamin D deficiency 09/10/2008  . COLONIC POLYPS, ADENOMATOUS 04/25/2007  . PERIPHERAL VASCULAR DISEASE 04/25/2007  . DIVERTICULOSIS, COLON 04/25/2007  . IBS 04/25/2007  . Osteoarthritis 04/25/2007  . POLYMYALGIA  RHEUMATICA 04/25/2007      Current Outpatient Prescriptions  Medication Sig Dispense Refill  . acetaminophen (TYLENOL) 500 MG tablet Take 500 mg by mouth every 6 (six) hours as needed (pain).     . cephALEXin (KEFLEX) 250 MG capsule Take 1 capsule (250 mg total) by mouth at bedtime. 30 capsule 5  . cholecalciferol (VITAMIN D) 1000 UNITS tablet Take 1,000 Units by mouth daily.      Marland Kitchen CINNAMON PO Take 1,000 mg by mouth daily.    . diclofenac sodium (VOLTAREN) 1 % GEL Apply 2 g topically 2 (two) times daily as needed (knee pain). 6 Tube 3  . hydrochlorothiazide (HYDRODIURIL) 25 MG tablet Take 25 mg by mouth daily.    . isosorbide mononitrate (IMDUR) 30 MG 24 hr tablet Take 30 mg by mouth at bedtime.    . metoprolol tartrate (LOPRESSOR) 25 MG tablet Take 1 tablet (25 mg total) by mouth 2 (two) times daily. 60 tablet 3  . Multiple Vitamins-Minerals (ICAPS PO) Take 1 capsule by mouth daily.    . nitroGLYCERIN (NITROSTAT) 0.4 MG SL tablet Place 1 tablet (0.4 mg total) under the tongue every 5 (five) minutes as needed for chest pain. As directed and as needed. 10 tablet 3  . Omega-3 Fatty Acids (FISH OIL) 1000 MG CAPS Take 1,000 mg by mouth daily.    . pantoprazole (PROTONIX) 40 MG tablet Take 1 tablet (40 mg total) by mouth 2 (two) times daily. 180 tablet 3  . rivaroxaban (XARELTO) 20 MG TABS tablet Take 1 tablet (20 mg total) by mouth daily with supper. Starting 07/08/14 30 tablet 3  . traMADol (ULTRAM) 50 MG tablet TAKE 1 TABLET BY MOUTH EVERY 8 HOURS AS NEEDED FOR PAIN. 90 tablet 3  . trolamine salicylate (ASPERCREME) 10 % cream Apply 1 application topically as needed for muscle pain.    . valsartan (DIOVAN) 80 MG tablet Take 1 tablet (80 mg total) by mouth daily. 30 tablet 23  . vitamin E 400 UNIT capsule Take 400 Units by mouth daily.       No current facility-administered medications for this visit.    Allergies:   Nitrofurantoin; Tetracycline; Erythromycin; Metformin; Metronidazole; Nsaids;  Rofecoxib; Simvastatin; and Sulfamethoxazole-trimethoprim    Social History:  The patient  reports that she has never smoked. She has never used smokeless tobacco. She reports that she does not drink alcohol or use illicit drugs.   Family History:  The patient's family history includes Cancer in her sister; Diabetes in her brother; Heart disease in her mother; Hypertension in her mother; Stroke in her mother.    ROS:  Please see the history of present illness.     Patient had her daughters deny fever, chills, headache, sweats, rash, change in vision, change in hearing, chest pain, cough, nausea or vomiting, urinary symptoms. All other systems are reviewed and are negative.  PHYSICAL EXAM: VS:  BP 170/82 mmHg  Pulse 60  Ht 5' (1.524 m)  Wt 156 lb (70.761 kg)  BMI 30.47 kg/m2 , Patient is oriented to person time and place. Affect is normal. She's here with 2 daughters. Head is atraumatic. Sclera and conjunctiva are normal. There is no jugular venous distention. Lungs are clear. Respiratory effort is nonlabored. Cardiac exam reveals an S1 and S2. The abdomen is soft. There is no peripheral edema. There are no skin rashes.  EKG:   EKG is not done today.   Recent Labs: 06/15/2014: Pro B Natriuretic peptide (BNP) 2873.0* 12/30/2014: ALT 16; BUN 41*; Creatinine 1.3*; Hemoglobin 11.8*; Platelets 277.0; Potassium 4.3; Sodium 138; TSH 2.60    Lipid Panel    Component Value Date/Time   CHOL 237* 12/30/2014 1002   TRIG 116.0 12/30/2014 1002   HDL 53.40 12/30/2014 1002   CHOLHDL 4 12/30/2014 1002   VLDL 23.2 12/30/2014 1002   LDLCALC 160* 12/30/2014 1002   LDLDIRECT 149.0 07/31/2013 0852      Wt Readings from Last 3 Encounters:  04/08/15 156 lb (70.761 kg)  01/06/15 150 lb 14.4 oz (68.448 kg)  10/01/14 146 lb 12.8 oz (66.588 kg)      Current medicines are reviewed  The patient's daughters understand her medications.     ASSESSMENT AND PLAN:

## 2015-04-08 NOTE — Assessment & Plan Note (Signed)
She does have some exertional shortness of breath. This is stable. No further workup is needed.

## 2015-04-08 NOTE — Telephone Encounter (Signed)
According to the pts med list it says start date is today 04/08/15 (I did not change the date at all and only changed mg of medication and ordered #30 with 6 refills) .  Dr Ron Parker told the pt to finish her Xarelto 20 mg tablets then start taking Xarelto 15 mg daily.  Not sure what they are inquiring about.

## 2015-04-08 NOTE — Assessment & Plan Note (Signed)
She does have mild coronary disease. No further workup is needed.

## 2015-04-08 NOTE — Assessment & Plan Note (Signed)
The patient has been Xarelto 20 mg daily since her pulmonary emboli in June, 2015. She did have deep venous thrombosis at that time. However there was no procedure that led to her DVT. Currently she is not very active. I have decided to continue her anticoagulation. Her daughters and I have discussed this carefully and they understand the pros and cons. Because GFR is less than 50, I decided to lower her dose to 15 mg daily. She will start to take this one her current 20 mg tablet supply is the pleated.  The patient and her daughters are aware that I will be retiring. They have requested that she be followed by Dr. Meda Coffee of our team. We will make these arrangements.

## 2015-04-08 NOTE — Assessment & Plan Note (Signed)
Her right ventricular dysfunction improved over time after her pulmonary emboli were treated.

## 2015-04-08 NOTE — Assessment & Plan Note (Signed)
She has recovered completely from this episode. I have continued her anticoagulation.

## 2015-04-08 NOTE — Patient Instructions (Signed)
**Note De-Identified  Obfuscation** Medication Instructions:  Decrease Xarelto to 15 mg at supper time  Labwork: None  Testing/Procedures: None  Follow-Up: Your physician wants you to follow-up in: 6 months. You will receive a reminder letter in the mail two months in advance. If you don't receive a letter, please call our office to schedule the follow-up appointment.   Any Other Special Instructions Will Be Listed Below (If Applicable).

## 2015-04-08 NOTE — Telephone Encounter (Signed)
[facility] called and wanted to clarify the rx that was sent in today for Xarelto 15mg . At the end of the sig it states that the patient is to start this on 07/08/14. Should this have been 07/09/15 or start when she finishes her 20mg  supply ?  Please advise. Thanks, MI

## 2015-04-09 NOTE — Telephone Encounter (Signed)
Called pharmacy to let them know that the "starting 07/08/14" must have been an error and that the patient is to start the Xarelto 15mg  when she finishes the Xarelto 20mg  that she has on hand.

## 2015-05-19 ENCOUNTER — Encounter: Payer: Self-pay | Admitting: Family Medicine

## 2015-05-19 ENCOUNTER — Ambulatory Visit (INDEPENDENT_AMBULATORY_CARE_PROVIDER_SITE_OTHER): Payer: Medicare Other | Admitting: Family Medicine

## 2015-05-19 VITALS — BP 150/78 | HR 75 | Temp 97.8°F | Ht 60.0 in | Wt 154.5 lb

## 2015-05-19 DIAGNOSIS — N390 Urinary tract infection, site not specified: Secondary | ICD-10-CM

## 2015-05-19 DIAGNOSIS — R300 Dysuria: Secondary | ICD-10-CM | POA: Diagnosis not present

## 2015-05-19 LAB — POCT URINALYSIS DIPSTICK
BILIRUBIN UA: NEGATIVE
GLUCOSE UA: NEGATIVE
Ketones, UA: NEGATIVE
NITRITE UA: POSITIVE
PH UA: 5.5
Spec Grav, UA: 1.02
UROBILINOGEN UA: 0.2

## 2015-05-19 MED ORDER — CIPROFLOXACIN HCL 250 MG PO TABS: 250.0000 mg | ORAL_TABLET | Freq: Two times a day (BID) | ORAL | Status: DC

## 2015-05-19 NOTE — Patient Instructions (Signed)
Take the cipro as directed for uti  We will contact you with urine culture result  Please increase your water intake   Update if not starting to improve in a week or if worsening

## 2015-05-19 NOTE — Progress Notes (Signed)
Subjective:    Patient ID: Ellen Peterson, female    DOB: 1920-02-21, 81 y.o.   MRN: [id number]  HPI Here for symptoms of uti   Started Sat - with n/v  A little unsteady and tired - perhaps a little more confused Yesterday - burning to urinate  No blood in urine that she can see -perhaps darker ?  No flank pain  Some low back pain   She still takes keflex one pill daily for prophylaxis   Had a uti at last visit to urology in April - given cipro (5 days)  Kept on keflex   Thought she was drinking enough water-family disagrees   Lab Results  Component Value Date   CREATININE 1.3* 12/30/2014   BUN 41* 12/30/2014   NA 138 12/30/2014   K 4.3 12/30/2014   CL 106 12/30/2014   CO2 25 12/30/2014     Results for orders placed or performed in visit on 05/19/15  POCT urinalysis dipstick  Result Value Ref Range   Color, UA Yellow    Clarity, UA Cloudy    Glucose, UA Neg.    Bilirubin, UA Neg.    Ketones, UA Neg.    Spec Grav, UA 1.020    Blood, UA Moderate    pH, UA 5.5    Protein, UA Trace    Urobilinogen, UA 0.2    Nitrite, UA Positive    Leukocytes, UA large (3+)     Review of Systems    Review of Systems  Constitutional: Negative for fever, appetite change, fatigue and unexpected weight change.  Eyes: Negative for pain and visual disturbance.  Respiratory: Negative for cough and shortness of breath.   Cardiovascular: Negative for cp or palpitations    Gastrointestinal: Negative for , diarrhea and constipation. pos for nausea w/o vomiting  Genitourinary: pos  for urgency and frequency. pos for dysuria and bladder pain , neg for flank pain  Skin: Negative for pallor or rash   Neurological: Negative for weakness, light-headedness, numbness and headaches.  Hematological: Negative for adenopathy. Does not bruise/bleed easily.  Psychiatric/Behavioral: Negative for dysphoric mood. The patient is not nervous/anxious.      Objective:   Physical Exam  Constitutional: She  appears well-developed and well-nourished. No distress.  Frail elderly female-unable to get on the table   overwt   HENT:  Head: Normocephalic and atraumatic.  Eyes: Conjunctivae and EOM are normal. Pupils are equal, round, and reactive to light.  Neck: Normal range of motion. Neck supple.  Cardiovascular: Normal rate, regular rhythm and normal heart sounds.   Pulmonary/Chest: Effort normal and breath sounds normal.  Abdominal: Soft. Bowel sounds are normal. She exhibits no distension. There is tenderness. There is no rebound.  No cva tenderness  Mild suprapubic tenderness  Musculoskeletal: She exhibits no edema.  Lymphadenopathy:    She has no cervical adenopathy.  Neurological: She is alert.  Skin: No rash noted.  Psychiatric: She has a normal mood and affect.          Assessment & Plan:   Problem List Items Addressed This Visit    Recurrent UTI - Primary    Sees urology On keflex prophylaxis now  uti today  tx with low dose cipro bid for 5 d and cx  Update if not starting to improve in a week or if worsening   Did enc to inc fluids       Relevant Orders   Urine culture    Other  Visit Diagnoses    Dysuria        Relevant Orders    POCT urinalysis dipstick (Completed)

## 2015-05-19 NOTE — Assessment & Plan Note (Signed)
Sees urology On keflex prophylaxis now  uti today  tx with low dose cipro bid for 5 d and cx  Update if not starting to improve in a week or if worsening   Did enc to inc fluids

## 2015-05-19 NOTE — Progress Notes (Signed)
Pre visit review using our clinic review tool, if applicable. No additional management support is needed unless otherwise documented below in the visit note. 

## 2015-05-22 LAB — URINE CULTURE: Colony Count: >=100000

## 2015-06-01 ENCOUNTER — Other Ambulatory Visit: Payer: Self-pay | Admitting: Cardiology

## 2015-06-04 NOTE — Telephone Encounter (Signed)
Please send in refills as these are cardiac meds and the pt is up to date on her f/u's. Thanks.

## 2015-06-04 NOTE — Telephone Encounter (Signed)
Please fill as this is a cardiac med and she is up to date on he f/u's. Thanks.

## 2015-06-11 ENCOUNTER — Ambulatory Visit (INDEPENDENT_AMBULATORY_CARE_PROVIDER_SITE_OTHER): Payer: Medicare Other | Admitting: Podiatry

## 2015-06-11 ENCOUNTER — Encounter: Payer: Self-pay | Admitting: Podiatry

## 2015-06-11 DIAGNOSIS — B351 Tinea unguium: Secondary | ICD-10-CM

## 2015-06-11 DIAGNOSIS — M79676 Pain in unspecified toe(s): Secondary | ICD-10-CM

## 2015-06-11 NOTE — Progress Notes (Signed)
Patient ID: Ellen Peterson, female   DOB: 1920/04/22, 81 y.o.   MRN: [id number]  Subjective: 81 y.o.-year-old female returns the office today for painful, elongated, thickened toenails which she is unable to do herself. Denies any redness or drainage around the nails. Denies any acute changes since last appointment and no new complaints today. Denies any systemic complaints such as fevers, chills, nausea, vomiting.   Objective: AAO 3, NAD DP/PT pulses palpable, CRT less than 3 seconds Protective sensation intact with Simms Weinstein monofilament, Vibratory sensation intact Nails hypertrophic, dystrophic, elongated, brittle, discolored 10. There is tenderness overlying the nails 1-5 bilaterally. There is no surrounding erythema or drainage along the nail sites. No open lesions or pre-ulcerative lesions are identified. No other areas of tenderness bilateral lower extremities. No overlying edema, erythema, increased warmth. No pain with calf compression, swelling, warmth, erythema.  Assessment: Patient presents with symptomatic onychomycosis  Plan: -Treatment options including alternatives, risks, complications were discussed -Nails sharply debrided 10  without complication/bleeding. -Discussed daily foot inspection. If there are any changes, to call the office immediately.  -Follow-up in 3 months or sooner if any problems are to arise. In the meantime, encouraged to call the office with any questions, concerns, changes symptoms.

## 2015-07-01 ENCOUNTER — Other Ambulatory Visit: Payer: Self-pay | Admitting: Family Medicine

## 2015-07-01 NOTE — Telephone Encounter (Signed)
Rx called in to pharmacy. 

## 2015-07-01 NOTE — Telephone Encounter (Signed)
Px written for call in   

## 2015-07-01 NOTE — Telephone Encounter (Signed)
Last filled 01/06/15 with 3 refills--please advise

## 2015-07-06 ENCOUNTER — Telehealth: Payer: Self-pay | Admitting: Family Medicine

## 2015-07-06 DIAGNOSIS — I10 Essential (primary) hypertension: Secondary | ICD-10-CM

## 2015-07-06 DIAGNOSIS — E785 Hyperlipidemia, unspecified: Secondary | ICD-10-CM

## 2015-07-06 DIAGNOSIS — N289 Disorder of kidney and ureter, unspecified: Secondary | ICD-10-CM

## 2015-07-06 DIAGNOSIS — R739 Hyperglycemia, unspecified: Secondary | ICD-10-CM

## 2015-07-06 NOTE — Telephone Encounter (Signed)
-----   Message from Ellamae Sia sent at 07/01/2015 11:54 AM EDT ----- Regarding: Lab orders for Tuesday, 7.19.16 Lab orders for a 6 month f/u

## 2015-07-07 ENCOUNTER — Other Ambulatory Visit (INDEPENDENT_AMBULATORY_CARE_PROVIDER_SITE_OTHER): Payer: Medicare Other

## 2015-07-07 DIAGNOSIS — E785 Hyperlipidemia, unspecified: Secondary | ICD-10-CM

## 2015-07-07 DIAGNOSIS — R739 Hyperglycemia, unspecified: Secondary | ICD-10-CM

## 2015-07-07 DIAGNOSIS — N289 Disorder of kidney and ureter, unspecified: Secondary | ICD-10-CM

## 2015-07-07 DIAGNOSIS — I10 Essential (primary) hypertension: Secondary | ICD-10-CM | POA: Diagnosis not present

## 2015-07-07 LAB — COMPREHENSIVE METABOLIC PANEL
ALT: 17 U/L (ref 0–35)
AST: 21 U/L (ref 0–37)
Albumin: 4 g/dL (ref 3.5–5.2)
Alkaline Phosphatase: 53 U/L (ref 39–117)
BUN: 38 mg/dL — ABNORMAL HIGH (ref 6–23)
CALCIUM: 9.6 mg/dL (ref 8.4–10.5)
CHLORIDE: 102 meq/L (ref 96–112)
CO2: 28 meq/L (ref 19–32)
Creatinine, Ser: 1.18 mg/dL (ref 0.40–1.20)
GFR: 45.47 mL/min — ABNORMAL LOW (ref >60.00)
GLUCOSE: 105 mg/dL — AB (ref 70–99)
POTASSIUM: 4.3 meq/L (ref 3.5–5.1)
SODIUM: 139 meq/L (ref 135–145)
Total Bilirubin: 0.5 mg/dL (ref 0.2–1.2)
Total Protein: 7 g/dL (ref 6.0–8.3)

## 2015-07-07 LAB — LIPID PANEL
CHOL/HDL RATIO: 3
Cholesterol: 236 mg/dL — ABNORMAL HIGH (ref 0–200)
HDL: 69.4 mg/dL (ref >39.00)
LDL Cholesterol: 142 mg/dL — ABNORMAL HIGH (ref 0–99)
NonHDL: 166.6
TRIGLYCERIDES: 121 mg/dL (ref 0.0–149.0)
VLDL: 24.2 mg/dL (ref 0.0–40.0)

## 2015-07-07 LAB — HEMOGLOBIN A1C: Hgb A1c MFr Bld: 6.1 % (ref 4.6–6.5)

## 2015-07-10 ENCOUNTER — Encounter: Payer: Self-pay | Admitting: Family Medicine

## 2015-07-10 ENCOUNTER — Ambulatory Visit (INDEPENDENT_AMBULATORY_CARE_PROVIDER_SITE_OTHER): Payer: Medicare Other | Admitting: Family Medicine

## 2015-07-10 VITALS — BP 122/68 | HR 64 | Temp 97.7°F | Wt 164.4 lb

## 2015-07-10 DIAGNOSIS — E785 Hyperlipidemia, unspecified: Secondary | ICD-10-CM | POA: Diagnosis not present

## 2015-07-10 DIAGNOSIS — N289 Disorder of kidney and ureter, unspecified: Secondary | ICD-10-CM | POA: Diagnosis not present

## 2015-07-10 DIAGNOSIS — R739 Hyperglycemia, unspecified: Secondary | ICD-10-CM

## 2015-07-10 DIAGNOSIS — I10 Essential (primary) hypertension: Secondary | ICD-10-CM | POA: Diagnosis not present

## 2015-07-10 NOTE — Assessment & Plan Note (Signed)
bp in fair control at this time  BP Readings from Last 1 Encounters:  07/10/15 122/68   No changes needed Disc lifstyle change with low sodium diet and exercise  Labs reviewed F/u 6 mo

## 2015-07-10 NOTE — Assessment & Plan Note (Signed)
Lab Results  Component Value Date   HGBA1C 6.1 07/07/2015   This is stable  Tries to avoid excessive sugar Enc activity as tolerated F/u 6 mo

## 2015-07-10 NOTE — Progress Notes (Signed)
Subjective:    Patient ID: Ellen Peterson, female    DOB: 1920-06-20, 81 y.o.   MRN: [id number]  HPI Here for f/u of chronic medical problems   Last visit tx with uti with cipro  cx pos for enterobacter  Feeling better  Sees urologist on a schedule    Wt is up 10 lb  Is eating well  Some swelling in ankles when she is hot  bmi of 32  Renal insuff Stable Family tries to get her to drink more water  Lab Results  Component Value Date   CREATININE 1.18 07/07/2015   BUN 38* 07/07/2015   NA 139 07/07/2015   K 4.3 07/07/2015   CL 102 07/07/2015   CO2 28 07/07/2015    GFR is improved   bp is stable today  No cp or palpitations or headaches or edema  No side effects to medicines  BP Readings from Last 3 Encounters:  07/10/15 122/68  05/19/15 150/78  04/08/15 170/82     Hyperglycemia Lab Results  Component Value Date   HGBA1C 6.1 07/07/2015   This is stable from last time Does not eat a lot of sweets   Hyperlipidemia Intol of med and refuses to take any Lab Results  Component Value Date   CHOL 236* 07/07/2015   CHOL 237* 12/30/2014   CHOL 207* 06/12/2014   Lab Results  Component Value Date   HDL 69.40 07/07/2015   HDL 53.40 12/30/2014   HDL 64 06/12/2014   Lab Results  Component Value Date   LDLCALC 142* 07/07/2015   LDLCALC 160* 12/30/2014   LDLCALC 123* 06/12/2014   Lab Results  Component Value Date   TRIG 121.0 07/07/2015   TRIG 116.0 12/30/2014   TRIG 102 06/12/2014   Lab Results  Component Value Date   CHOLHDL 3 07/07/2015   CHOLHDL 4 12/30/2014   CHOLHDL 3.2 06/12/2014   Lab Results  Component Value Date   LDLDIRECT 149.0 07/31/2013   LDLDIRECT 142.2 02/04/2013   LDLDIRECT 130.0 03/25/2010   fish oil seems to be helping the HDL  Is taking pain med which allows her to be more active   Eating lots of fruit and tomato   Patient Active Problem List   Diagnosis Date Noted  . Shortness of breath 08/20/2014  . DVT (deep venous  thrombosis) 07/23/2014  . CAD (coronary artery disease), native coronary artery 07/23/2014  . Pulmonary hypertension 07/23/2014  . Right ventricular dysfunction 07/23/2014  . HX: anticoagulation 07/23/2014  . Dysphasia 07/23/2014  . Ejection fraction   . Essential hypertension, benign 06/22/2014  . UI (urinary incontinence) 06/22/2014  . GERD (gastroesophageal reflux disease) 06/22/2014  . Physical deconditioning 06/22/2014  . Bilateral pulmonary embolism 06/15/2014  . Encounter for Medicare annual wellness exam 08/08/2013  . LBBB (left bundle branch block)   . Hyperlipidemia   . Wide-complex tachycardia   . Venous insufficiency 06/03/2011  . Hyperglycemia 11/17/2010  . Anemia 02/08/2010  . Renal insufficiency 10/27/2009  . Recurrent UTI 09/22/2008  . Vitamin D deficiency 09/10/2008  . COLONIC POLYPS, ADENOMATOUS 04/25/2007  . PERIPHERAL VASCULAR DISEASE 04/25/2007  . DIVERTICULOSIS, COLON 04/25/2007  . IBS 04/25/2007  . Osteoarthritis 04/25/2007  . POLYMYALGIA RHEUMATICA 04/25/2007   Past Medical History  Diagnosis Date  . Diabetes mellitus     type II  . Diverticulosis of colon   . GERD (gastroesophageal reflux disease)   . Hyperlipidemia   . Hypertension   . Arthritis  OA  . Peripheral vascular disease   . PMR (polymyalgia rheumatica)   . Frequent UTI   . Chest tightness     catheterization, 2005, normal coronaries  . Wide-complex tachycardia     SVT in past  . Hypokalemia   . LBBB (left bundle branch block)     Since at least 2005  . Ejection fraction < 50%     EF 40% echo, 09/2009  . Complication of anesthesia   . PONV (postoperative nausea and vomiting)   . PE (pulmonary embolism) 06/16/2014  . Shortness of breath   . UTI (urinary tract infection)     HX OF RECURRENT UTI'  . Ejection fraction    Past Surgical History  Procedure Laterality Date  . Appendectomy  1993  . Cholecystectomy    . Colectomy  1993    partial  . Abdominal hysterectomy   1960s    fibroids total  . Vein ligation and stripping    . Bladder tack up    . Back surgery    . Esophagogastroduodenoscopy  05/2004  . Bilateral pyelonephritis    . Cardiac catheterization  2005    no significant CAD  . Tonsillectomy    . Left heart catheterization with coronary angiogram N/A 06/12/2014    Procedure: LEFT HEART CATHETERIZATION WITH CORONARY ANGIOGRAM;  Surgeon: Peter M Martinique, MD;  Location: [facility] CATH LAB;  Service: Cardiovascular;  Laterality: N/A;   History  Substance Use Topics  . Smoking status: Never Smoker   . Smokeless tobacco: Never Used  . Alcohol Use: No   Family History  Problem Relation Age of Onset  . Heart disease Mother     CAD  . Hypertension Mother   . Stroke Mother   . Cancer Sister     pancreatic CA  . Diabetes Brother    Allergies  Allergen Reactions  . Nitrofurantoin Hives  . Tetracycline     REACTION: Rash  . Erythromycin     REACTION: Rash  . Metformin     REACTION: vomiting and diarrhea  . Metronidazole     REACTION: whelps  . Nsaids     REACTION: GI symptoms  . Rofecoxib     REACTION: GI symptoms  . Simvastatin     REACTION: myalgia  . Sulfamethoxazole-Trimethoprim     REACTION: itching   Current Outpatient Prescriptions on File Prior to Visit  Medication Sig Dispense Refill  . acetaminophen (TYLENOL) 500 MG tablet Take 500 mg by mouth every 6 (six) hours as needed (pain).     . cephALEXin (KEFLEX) 250 MG capsule Take 1 capsule (250 mg total) by mouth at bedtime. 30 capsule 5  . cholecalciferol (VITAMIN D) 1000 UNITS tablet Take 1,000 Units by mouth daily.      Marland Kitchen CINNAMON PO Take 1,000 mg by mouth daily.    . ciprofloxacin (CIPRO) 250 MG tablet Take 1 tablet (250 mg total) by mouth 2 (two) times daily. 10 tablet 0  . diclofenac sodium (VOLTAREN) 1 % GEL Apply 2 g topically 2 (two) times daily as needed (knee pain). 6 Tube 3  . hydrochlorothiazide (HYDRODIURIL) 25 MG tablet TAKE 1 TABLET BY MOUTH DAILY 30 tablet 11  .  isosorbide mononitrate (IMDUR) 30 MG 24 hr tablet TAKE 1 TABLET BY MOUTH DAILY 30 tablet 11  . metoprolol tartrate (LOPRESSOR) 25 MG tablet TAKE 1 TABLET BY MOUTH TWICE A DAY 60 tablet 11  . Multiple Vitamins-Minerals (ICAPS PO) Take 1 capsule by mouth daily.    Marland Kitchen  nitroGLYCERIN (NITROSTAT) 0.4 MG SL tablet Place 1 tablet (0.4 mg total) under the tongue every 5 (five) minutes as needed for chest pain. As directed and as needed. 10 tablet 3  . Omega-3 Fatty Acids (FISH OIL) 1000 MG CAPS Take 1,000 mg by mouth daily.    . pantoprazole (PROTONIX) 40 MG tablet Take 1 tablet (40 mg total) by mouth 2 (two) times daily. 180 tablet 3  . rivaroxaban (XARELTO) 15 MG TABS tablet Take 1 tablet (15 mg total) by mouth daily with supper. Starting 07/08/14 30 tablet 6  . traMADol (ULTRAM) 50 MG tablet TAKE 1 TABLET EVERY EIGHT HOURS AS NEEDED FOR PAIN 90 tablet 3  . trolamine salicylate (ASPERCREME) 10 % cream Apply 1 application topically as needed for muscle pain.    . valsartan (DIOVAN) 80 MG tablet Take 1 tablet (80 mg total) by mouth daily. 30 tablet 23  . vitamin E 400 UNIT capsule Take 400 Units by mouth daily.       No current facility-administered medications on file prior to visit.        Review of Systems    Review of Systems  Constitutional: Negative for fever, appetite change, and unexpected weight change.  Eyes: Negative for pain and visual disturbance.  Respiratory: Negative for cough and shortness of breath.   Cardiovascular: Negative for cp or palpitations    Gastrointestinal: Negative for nausea, diarrhea and constipation.  Genitourinary: neg for dysuria or hematuria / pos for baseline incontinence  Skin: Negative for pallor or rash   Neurological: Negative for weakness, light-headedness, numbness and headaches.  Hematological: Negative for adenopathy. Does not bruise/bleed easily.  Psychiatric/Behavioral: Negative for dysphoric mood. The patient is not nervous/anxious.        Objective:   Physical Exam  Constitutional: She appears well-developed and well-nourished. No distress.  Frail appearing elderly female with walker  HENT:  Head: Normocephalic and atraumatic.  Mouth/Throat: Oropharynx is clear and moist.  Eyes: Conjunctivae and EOM are normal. Pupils are equal, round, and reactive to light.  Neck: Normal range of motion. Neck supple. No JVD present. Carotid bruit is not present. No thyromegaly present.  Cardiovascular: Normal rate, regular rhythm, normal heart sounds and intact distal pulses.  Exam reveals no gallop.   Pulmonary/Chest: Effort normal and breath sounds normal. No respiratory distress. She has no wheezes. She has no rales.  No crackles  Abdominal: Soft. Bowel sounds are normal. She exhibits no distension, no abdominal bruit and no mass. There is no tenderness.  No cva tenderness No suprapubic tenderness or fullness    Musculoskeletal: She exhibits no edema.  Lymphadenopathy:    She has no cervical adenopathy.  Neurological: She is alert. She has normal reflexes.  Skin: Skin is warm and dry. No rash noted.  Psychiatric: She has a normal mood and affect.  Mentally sharp          Assessment & Plan:   Problem List Items Addressed This Visit    Essential hypertension, benign - Primary    bp in fair control at this time  BP Readings from Last 1 Encounters:  07/10/15 122/68   No changes needed Disc lifstyle change with low sodium diet and exercise  Labs reviewed F/u 6 mo       Hyperglycemia    Lab Results  Component Value Date   HGBA1C 6.1 07/07/2015   This is stable  Tries to avoid excessive sugar Enc activity as tolerated F/u 6 mo  Hyperlipidemia    Some improvement today  Intol of meds and declines them Disc goals for lipids and reasons to control them Rev labs with pt Rev low sat fat diet in detail        Renal insufficiency    Overall stable  Enc water intake esp in light of recurrent uti  Continue to  follow

## 2015-07-10 NOTE — Patient Instructions (Signed)
Labs are stable  Stay as active as you can be  Eat a healthy diet  No change in medicines  Follow up in 6 months for annual exam with lab prior

## 2015-07-10 NOTE — Progress Notes (Signed)
Pre visit review using our clinic review tool, if applicable. No additional management support is needed unless otherwise documented below in the visit note. 

## 2015-07-10 NOTE — Assessment & Plan Note (Signed)
Overall stable  Enc water intake esp in light of recurrent uti  Continue to follow

## 2015-07-10 NOTE — Assessment & Plan Note (Signed)
Some improvement today  Intol of meds and declines them Disc goals for lipids and reasons to control them Rev labs with pt Rev low sat fat diet in detail

## 2015-07-24 ENCOUNTER — Other Ambulatory Visit: Payer: Self-pay | Admitting: Family Medicine

## 2015-09-01 ENCOUNTER — Encounter: Payer: Self-pay | Admitting: Primary Care

## 2015-09-01 ENCOUNTER — Ambulatory Visit (INDEPENDENT_AMBULATORY_CARE_PROVIDER_SITE_OTHER): Payer: Medicare Other | Admitting: Primary Care

## 2015-09-01 VITALS — BP 132/68 | HR 68 | Temp 97.4°F | Ht 60.0 in | Wt 159.1 lb

## 2015-09-01 DIAGNOSIS — N390 Urinary tract infection, site not specified: Secondary | ICD-10-CM

## 2015-09-01 DIAGNOSIS — R109 Unspecified abdominal pain: Secondary | ICD-10-CM

## 2015-09-01 DIAGNOSIS — R319 Hematuria, unspecified: Secondary | ICD-10-CM | POA: Diagnosis not present

## 2015-09-01 LAB — POCT URINALYSIS DIPSTICK
BILIRUBIN UA: NEGATIVE
Glucose, UA: NEGATIVE
Ketones, UA: NEGATIVE
NITRITE UA: NEGATIVE
PH UA: 6
Protein, UA: NEGATIVE
Spec Grav, UA: 1.02
Urobilinogen, UA: NEGATIVE

## 2015-09-01 MED ORDER — CIPROFLOXACIN HCL 250 MG PO TABS: 250.0000 mg | ORAL_TABLET | Freq: Two times a day (BID) | ORAL | Status: DC

## 2015-09-01 NOTE — Progress Notes (Signed)
Pre visit review using our clinic review tool, if applicable. No additional management support is needed unless otherwise documented below in the visit note. 

## 2015-09-01 NOTE — Patient Instructions (Signed)
Your urine shows infection.  Start Ciprofloxacin antibiotic. Take 1 tablet by mouth twice daily for urinary tract infection.  Increase consumption of water to stay hydrated and to flush out bacteria.   Please notify me if you develop fevers, weakness, or if symptoms do not improve within 4 days.  It was a pleasure meeting you!  Urinary Tract Infection Urinary tract infections (UTIs) can develop anywhere along your urinary tract. Your urinary tract is your body's drainage system for removing wastes and extra water. Your urinary tract includes two kidneys, two ureters, a bladder, and a urethra. Your kidneys are a pair of bean-shaped organs. Each kidney is about the size of your fist. They are located below your ribs, one on each side of your spine. CAUSES Infections are caused by microbes, which are microscopic organisms, including fungi, viruses, and bacteria. These organisms are so small that they can only be seen through a microscope. Bacteria are the microbes that most commonly cause UTIs. SYMPTOMS  Symptoms of UTIs may vary by age and gender of the patient and by the location of the infection. Symptoms in young women typically include a frequent and intense urge to urinate and a painful, burning feeling in the bladder or urethra during urination. Older women and men are more likely to be tired, shaky, and weak and have muscle aches and abdominal pain. A fever may mean the infection is in your kidneys. Other symptoms of a kidney infection include pain in your back or sides below the ribs, nausea, and vomiting. DIAGNOSIS To diagnose a UTI, your caregiver will ask you about your symptoms. Your caregiver also will ask to provide a urine sample. The urine sample will be tested for bacteria and white blood cells. White blood cells are made by your body to help fight infection. TREATMENT  Typically, UTIs can be treated with medication. Because most UTIs are caused by a bacterial infection, they  usually can be treated with the use of antibiotics. The choice of antibiotic and length of treatment depend on your symptoms and the type of bacteria causing your infection. HOME CARE INSTRUCTIONS  If you were prescribed antibiotics, take them exactly as your caregiver instructs you. Finish the medication even if you feel better after you have only taken some of the medication.  Drink enough water and fluids to keep your urine clear or pale yellow.  Avoid caffeine, tea, and carbonated beverages. They tend to irritate your bladder.  Empty your bladder often. Avoid holding urine for long periods of time.  Empty your bladder before and after sexual intercourse.  After a bowel movement, women should cleanse from front to back. Use each tissue only once. SEEK MEDICAL CARE IF:   You have back pain.  You develop a fever.  Your symptoms do not begin to resolve within 3 days. SEEK IMMEDIATE MEDICAL CARE IF:   You have severe back pain or lower abdominal pain.  You develop chills.  You have nausea or vomiting.  You have continued burning or discomfort with urination. MAKE SURE YOU:   Understand these instructions.  Will watch your condition.  Will get help right away if you are not doing well or get worse. Document Released: 09/14/2005 Document Revised: 06/05/2012 Document Reviewed: 01/13/2012 [facility] Patient Information 2015 [place], [place]. This information is not intended to replace advice given to you by your health care provider. Make sure you discuss any questions you have with your health care provider.

## 2015-09-01 NOTE — Progress Notes (Signed)
Subjective:    Patient ID: Ellen Peterson, female    DOB: 11/22/1920, 81 y.o.   MRN: [id number]  HPI  [name] is a 81 year old female who presents today with a chief complaint of dysuria. She also reports flank pain, frequency, and believes she had one episode of hematruia. She urinated on a urine strip last night which showed a high possibility of infection. She does not drink much water throughout the day. She is managed on low dose cephalexin daily due to reoccurrence of urinary tract infections. Denies fevers.   Review of Systems  Constitutional: Negative for fever and chills.  Gastrointestinal: Negative for abdominal pain.  Genitourinary: Positive for dysuria, frequency, flank pain and pelvic pain. Negative for vaginal discharge and difficulty urinating.       Past Medical History  Diagnosis Date  . Diabetes mellitus     type II  . Diverticulosis of colon   . GERD (gastroesophageal reflux disease)   . Hyperlipidemia   . Hypertension   . Arthritis     OA  . Peripheral vascular disease   . PMR (polymyalgia rheumatica)   . Frequent UTI   . Chest tightness     catheterization, 2005, normal coronaries  . Wide-complex tachycardia     SVT in past  . Hypokalemia   . LBBB (left bundle branch block)     Since at least 2005  . Ejection fraction < 50%     EF 40% echo, 09/2009  . Complication of anesthesia   . PONV (postoperative nausea and vomiting)   . PE (pulmonary embolism) 06/16/2014  . Shortness of breath   . UTI (urinary tract infection)     HX OF RECURRENT UTI'  . Ejection fraction     Social History   Social History  . Marital Status: Widowed    Spouse Name: N/A  . Number of Children: N/A  . Years of Education: N/A   Occupational History  . Not on file.   Social History Main Topics  . Smoking status: Never Smoker   . Smokeless tobacco: Never Used  . Alcohol Use: No  . Drug Use: No  . Sexual Activity: Not on file   Other Topics Concern  . Not on file     Social History Narrative    Past Surgical History  Procedure Laterality Date  . Appendectomy  1993  . Cholecystectomy    . Colectomy  1993    partial  . Abdominal hysterectomy  1960s    fibroids total  . Vein ligation and stripping    . Bladder tack up    . Back surgery    . Esophagogastroduodenoscopy  05/2004  . Bilateral pyelonephritis    . Cardiac catheterization  2005    no significant CAD  . Tonsillectomy    . Left heart catheterization with coronary angiogram N/A 06/12/2014    Procedure: LEFT HEART CATHETERIZATION WITH CORONARY ANGIOGRAM;  Surgeon: Peter M Martinique, MD;  Location: [facility] C[facility] LAB;  Service: Cardiovascular;  Laterality: N/A;    Family History  Problem Relation Age of Onset  . Heart disease Mother     CAD  . Hypertension Mother   . Stroke Mother   . Cancer Sister     pancreatic CA  . Diabetes Brother     Allergies  Allergen Reactions  . Nitrofurantoin Hives  . Tetracycline     REACTION: Rash  . Erythromycin     REACTION: Rash  . Metformin  REACTION: vomiting and diarrhea  . Metronidazole     REACTION: whelps  . Nsaids     REACTION: GI symptoms  . Rofecoxib     REACTION: GI symptoms  . Simvastatin     REACTION: myalgia  . Sulfamethoxazole-Trimethoprim     REACTION: itching    Current Outpatient Prescriptions on File Prior to Visit  Medication Sig Dispense Refill  . acetaminophen (TYLENOL) 500 MG tablet Take 500 mg by mouth every 6 (six) hours as needed (pain).     . cephALEXin (KEFLEX) 250 MG capsule Take 1 capsule (250 mg total) by mouth at bedtime. 30 capsule 5  . cholecalciferol (VITAMIN D) 1000 UNITS tablet Take 1,000 Units by mouth daily.      Marland Kitchen CINNAMON PO Take 1,000 mg by mouth daily.    . diclofenac sodium (VOLTAREN) 1 % GEL Apply 2 g topically 2 (two) times daily as needed (knee pain). 6 Tube 3  . hydrochlorothiazide (HYDRODIURIL) 25 MG tablet TAKE 1 TABLET BY MOUTH DAILY 30 tablet 11  . isosorbide mononitrate (IMDUR) 30 MG  24 hr tablet TAKE 1 TABLET BY MOUTH DAILY 30 tablet 11  . metoprolol tartrate (LOPRESSOR) 25 MG tablet TAKE 1 TABLET BY MOUTH TWICE A DAY 60 tablet 11  . Multiple Vitamins-Minerals (ICAPS PO) Take 1 capsule by mouth daily.    . nitroGLYCERIN (NITROSTAT) 0.4 MG SL tablet Place 1 tablet (0.4 mg total) under the tongue every 5 (five) minutes as needed for chest pain. As directed and as needed. 10 tablet 3  . Omega-3 Fatty Acids (FISH OIL) 1000 MG CAPS Take 1,000 mg by mouth daily.    . pantoprazole (PROTONIX) 40 MG tablet Take 1 tablet (40 mg total) by mouth 2 (two) times daily. 180 tablet 3  . rivaroxaban (XARELTO) 15 MG TABS tablet Take 1 tablet (15 mg total) by mouth daily with supper. Starting 07/08/14 30 tablet 6  . traMADol (ULTRAM) 50 MG tablet TAKE 1 TABLET EVERY EIGHT HOURS AS NEEDED FOR PAIN 90 tablet 3  . trolamine salicylate (ASPERCREME) 10 % cream Apply 1 application topically as needed for muscle pain.    . valsartan (DIOVAN) 80 MG tablet Take 1 tablet (80 mg total) by mouth daily. 30 tablet 23  . vitamin E 400 UNIT capsule Take 400 Units by mouth daily.       No current facility-administered medications on file prior to visit.    BP 132/68 mmHg  Pulse 68  Temp(Src) 97.4 F (36.3 C) (Oral)  Ht 5' (1.524 m)  Wt 159 lb 1.9 oz (72.176 kg)  BMI 31.08 kg/m2  SpO2 98%    Objective:   Physical Exam  Constitutional: She is oriented to person, place, and time. She appears well-nourished.  Cardiovascular: Normal rate and regular rhythm.   Pulmonary/Chest: Effort normal and breath sounds normal.  Abdominal: Soft. There is no tenderness. There is CVA tenderness.  Neurological: She is alert and oriented to person, place, and time.  Skin: Skin is warm and dry.          Assessment & Plan:

## 2015-09-01 NOTE — Assessment & Plan Note (Addendum)
Managed on daily cephalexin for re-occuring UTIs. UA today with leuks and blood. Culture sent. Exam with bilateral CVA tenderness, more so to right side. Will start low dose cipro BID x 7 days. Increase intake of water. She is to notify me if no improvement in the next 3-4 days.

## 2015-09-04 LAB — URINE CULTURE

## 2015-09-08 ENCOUNTER — Ambulatory Visit: Payer: Medicare Other

## 2015-09-08 ENCOUNTER — Encounter: Payer: Self-pay | Admitting: *Deleted

## 2015-09-11 ENCOUNTER — Ambulatory Visit (INDEPENDENT_AMBULATORY_CARE_PROVIDER_SITE_OTHER): Payer: Medicare Other | Admitting: Cardiology

## 2015-09-11 ENCOUNTER — Encounter: Payer: Self-pay | Admitting: Cardiology

## 2015-09-11 VITALS — BP 140/60 | HR 66 | Ht 60.0 in | Wt 158.0 lb

## 2015-09-11 DIAGNOSIS — I10 Essential (primary) hypertension: Secondary | ICD-10-CM

## 2015-09-11 DIAGNOSIS — Z9229 Personal history of other drug therapy: Secondary | ICD-10-CM

## 2015-09-11 DIAGNOSIS — Z7901 Long term (current) use of anticoagulants: Secondary | ICD-10-CM

## 2015-09-11 DIAGNOSIS — I2699 Other pulmonary embolism without acute cor pulmonale: Secondary | ICD-10-CM

## 2015-09-11 DIAGNOSIS — R0602 Shortness of breath: Secondary | ICD-10-CM

## 2015-09-11 DIAGNOSIS — I2510 Atherosclerotic heart disease of native coronary artery without angina pectoris: Secondary | ICD-10-CM | POA: Diagnosis not present

## 2015-09-11 NOTE — Assessment & Plan Note (Signed)
She has mild coronary disease with no significant symptoms. No further workup.

## 2015-09-11 NOTE — Assessment & Plan Note (Signed)
The patient has chronic shortness of breath. I am not convinced that she is having any significant CHF. She has trace peripheral edema. I'm hesitant to adjust her medicines. She drinks high-volume liquids for her urinary tract infections. I have encouraged her to possibly cut back slightly. I've chosen not to adjust her diuretics.

## 2015-09-11 NOTE — Assessment & Plan Note (Signed)
The patient had large pulmonary emboli in June, 2015. Her right ventricular dysfunction improved. She is on chronic anticoagulation and stable. No further workup.

## 2015-09-11 NOTE — Assessment & Plan Note (Signed)
The patient will remain on 15 mg of Xarelto.

## 2015-09-11 NOTE — Progress Notes (Signed)
Cardiology Office Note   Date:  09/11/2015   ID:  Ellen Peterson, DOB October 16, 1920, MRN [id number]  PCP:  [name], MD  Cardiologist:  Dola Argyle, MD   Chief Complaint  Patient presents with  . Appointment    Follow-up history of pulmonary emboli and shortness of breath.      History of Present Illness: Ellen Peterson is a 81 y.o. female who presents today to follow-up her shortness of breath. She is here with her 2 daughters. She does have exertional shortness of breath. She does not have PND or orthopnea. She has some mild edema that increases during the day. She is drinking extra fluid as recommended by her physicians treating her recurrent urinary tract infection.  The patient had presented to the hospital in June, 2015 with shortness of breath. Initially, it was felt that her symptoms might be cardiac. Catheterization was done. It showed no significant treatable disease and she went home. She then came back to the emergency room within 24 hours. Diagnosis was made of marked pulmonary emboli. The patient did well. The patient's daughters have a complete understanding of the events. Initially she had right ventricular dysfunction. This is improved. She has continued on anticoagulation. I have adjusted the dose of her is her Xarelto. The plan would be to continue this indefinitely.  The patient and her daughters have requested follow-up with Dr. Meda Coffee. One of her daughters has seen Dr. Meda Coffee is a patient.  Past Medical History  Diagnosis Date  . Diabetes mellitus, type 2   . Diverticulosis of colon   . GERD (gastroesophageal reflux disease)   . Hyperlipidemia   . Hypertension   . OA (osteoarthritis)   . Peripheral vascular disease   . PMR (polymyalgia rheumatica)   . Frequent UTI   . Chest tightness     catheterization, 2005, normal coronaries  . Wide-complex tachycardia     SVT in past  . Hypokalemia   . LBBB (left bundle branch block)     Since at least 2005  .  Ejection fraction < 50%     EF 40% echo, 09/2009  . Complication of anesthesia   . PONV (postoperative nausea and vomiting)   . PE (pulmonary embolism) 06/16/2014  . Shortness of breath   . Recurrent UTI     Past Surgical History  Procedure Laterality Date  . Appendectomy  1993  . Cholecystectomy    . Colectomy  1993    partial  . Total abdominal hysterectomy  1960s    fibroids   . Vein ligation and stripping    . Bladder tack up    . Back surgery    . Esophagogastroduodenoscopy  05/2004    baretts esophagus, stomach polyps  . Bilateral pyelonephritis    . Cardiac catheterization  2005    no significant CAD  . Tonsillectomy    . Left heart catheterization with coronary angiogram N/A 06/12/2014    Procedure: LEFT HEART CATHETERIZATION WITH CORONARY ANGIOGRAM;  Surgeon: Peter M Martinique, MD;  Location: [facility] CATH LAB;  Service: Cardiovascular;  Laterality: N/A;    Patient Active Problem List   Diagnosis Date Noted  . Shortness of breath 08/20/2014  . DVT (deep venous thrombosis) 07/23/2014  . CAD (coronary artery disease), native coronary artery 07/23/2014  . Pulmonary hypertension 07/23/2014  . Right ventricular dysfunction 07/23/2014  . HX: anticoagulation 07/23/2014  . Dysphasia 07/23/2014  . Ejection fraction   . Essential hypertension, benign 06/22/2014  .  UI (urinary incontinence) 06/22/2014  . GERD (gastroesophageal reflux disease) 06/22/2014  . Physical deconditioning 06/22/2014  . Bilateral pulmonary embolism 06/15/2014  . Encounter for Medicare annual wellness exam 08/08/2013  . LBBB (left bundle branch block)   . Hyperlipidemia   . Wide-complex tachycardia   . Venous insufficiency 06/03/2011  . Hyperglycemia 11/17/2010  . Anemia 02/08/2010  . Renal insufficiency 10/27/2009  . Recurrent UTI 09/22/2008  . Vitamin D deficiency 09/10/2008  . COLONIC POLYPS, ADENOMATOUS 04/25/2007  . PERIPHERAL VASCULAR DISEASE 04/25/2007  . DIVERTICULOSIS, COLON 04/25/2007  .  IBS 04/25/2007  . Osteoarthritis 04/25/2007  . POLYMYALGIA RHEUMATICA 04/25/2007      Current Outpatient Prescriptions  Medication Sig Dispense Refill  . acetaminophen (TYLENOL) 500 MG tablet Take 500 mg by mouth every 6 (six) hours as needed (pain).     . cephALEXin (KEFLEX) 250 MG capsule Take 1 capsule (250 mg total) by mouth at bedtime. 30 capsule 5  . cholecalciferol (VITAMIN D) 1000 UNITS tablet Take 1,000 Units by mouth daily.      Marland Kitchen CINNAMON PO Take 1,000 mg by mouth daily.    . ciprofloxacin (CIPRO) 250 MG tablet Take 1 tablet (250 mg total) by mouth 2 (two) times daily. 14 tablet 0  . diclofenac sodium (VOLTAREN) 1 % GEL Apply 2 g topically 2 (two) times daily as needed (knee pain). 6 Tube 3  . hydrochlorothiazide (HYDRODIURIL) 25 MG tablet TAKE 1 TABLET BY MOUTH DAILY 30 tablet 11  . isosorbide mononitrate (IMDUR) 30 MG 24 hr tablet TAKE 1 TABLET BY MOUTH DAILY 30 tablet 11  . metoprolol tartrate (LOPRESSOR) 25 MG tablet TAKE 1 TABLET BY MOUTH TWICE A DAY 60 tablet 11  . Multiple Vitamins-Minerals (ICAPS PO) Take 1 capsule by mouth daily.    . nitroGLYCERIN (NITROSTAT) 0.4 MG SL tablet Place 1 tablet (0.4 mg total) under the tongue every 5 (five) minutes as needed for chest pain. As directed and as needed. 10 tablet 3  . Omega-3 Fatty Acids (FISH OIL) 1000 MG CAPS Take 1,000 mg by mouth daily.    . pantoprazole (PROTONIX) 40 MG tablet Take 1 tablet (40 mg total) by mouth 2 (two) times daily. 180 tablet 3  . rivaroxaban (XARELTO) 15 MG TABS tablet Take 1 tablet (15 mg total) by mouth daily with supper. Starting 07/08/14 30 tablet 6  . traMADol (ULTRAM) 50 MG tablet TAKE 1 TABLET EVERY EIGHT HOURS AS NEEDED FOR PAIN 90 tablet 3  . trolamine salicylate (ASPERCREME) 10 % cream Apply 1 application topically as needed for muscle pain.    . valsartan (DIOVAN) 80 MG tablet Take 1 tablet (80 mg total) by mouth daily. 30 tablet 23  . vitamin E 400 UNIT capsule Take 400 Units by mouth daily.        No current facility-administered medications for this visit.    Allergies:   Nitrofurantoin; Tetracycline; Erythromycin; Metformin; Metronidazole; Nsaids; Rofecoxib; Simvastatin; and Sulfamethoxazole-trimethoprim    Social History:  The patient  reports that she has never smoked. She has never used smokeless tobacco. She reports that she does not drink alcohol or use illicit drugs.   Family History:  The patient's family history includes CAD in her mother; Diabetes in her brother; Heart Problems in her brother; Heart attack in her brother and mother; Hypertension in her mother; Pancreatic cancer in her sister; Stroke in her mother.    ROS:  Please see the history of present illness.    Patient denies fever, chills,  headache, sweats, rash, change in vision, change in hearing, chest pain, cough, nausea or vomiting, urinary symptoms. All other systems are reviewed and are negative.    PHYSICAL EXAM: VS:  BP 140/60 mmHg  Pulse 66  Ht 5' (1.524 m)  Wt 158 lb (71.668 kg)  BMI 30.86 kg/m2 , The patient is oriented to person time and place. Affect is normal. She is able to walk with a rolling walker. Head is atraumatic. Sclera and conjunctiva are normal. There is no jugular venous distention. Lungs are clear. Respiratory effort is nonlabored. Cardiac exam reveals S1 and S2. The abdomen is soft. The patient has trace peripheral edema in the right lower extremity.  EKG:   EKG is done today and reviewed by me. There is sinus rhythm. There is old left bundle branch block.  Recent Labs: 12/30/2014: Hemoglobin 11.8*; Platelets 277.0; TSH 2.60 07/07/2015: ALT 17; BUN 38*; Creatinine, Ser 1.18; Potassium 4.3; Sodium 139    Lipid Panel    Component Value Date/Time   CHOL 236* 07/07/2015 0901   TRIG 121.0 07/07/2015 0901   HDL 69.40 07/07/2015 0901   CHOLHDL 3 07/07/2015 0901   VLDL 24.2 07/07/2015 0901   LDLCALC 142* 07/07/2015 0901   LDLDIRECT 149.0 07/31/2013 0852      Wt Readings  from Last 3 Encounters:  09/11/15 158 lb (71.668 kg)  09/01/15 159 lb 1.9 oz (72.176 kg)  07/10/15 164 lb 6.4 oz (74.571 kg)      Current medicines are reviewed  The patient and her family understand her medications.     ASSESSMENT AND PLAN:

## 2015-09-11 NOTE — Patient Instructions (Addendum)
Medication Instructions:  Same-no changes  Labwork: None  Testing/Procedures: None  Follow-Up: Your physician wants you to follow-up in: 5 month f/u with Dr Meda Coffee. You will receive a reminder letter in the mail two months in advance. If you don't receive a letter, please call our office to schedule the follow-up appointment.

## 2015-09-14 ENCOUNTER — Ambulatory Visit: Payer: Medicare Other

## 2015-09-22 ENCOUNTER — Ambulatory Visit (INDEPENDENT_AMBULATORY_CARE_PROVIDER_SITE_OTHER): Payer: Medicare Other | Admitting: Sports Medicine

## 2015-09-22 ENCOUNTER — Encounter: Payer: Self-pay | Admitting: Sports Medicine

## 2015-09-22 ENCOUNTER — Ambulatory Visit: Payer: Medicare Other

## 2015-09-22 DIAGNOSIS — M79676 Pain in unspecified toe(s): Secondary | ICD-10-CM | POA: Diagnosis not present

## 2015-09-22 DIAGNOSIS — I739 Peripheral vascular disease, unspecified: Secondary | ICD-10-CM

## 2015-09-22 DIAGNOSIS — B351 Tinea unguium: Secondary | ICD-10-CM | POA: Diagnosis not present

## 2015-09-22 NOTE — Progress Notes (Signed)
Patient ID: Ellen Peterson, female   DOB: 04/10/1920, 81 y.o.   MRN: [id number] Subjective: PRICSILLA Peterson is a 81 y.o. female patient with history of PVD who presents to office today complaining of long, painful nails  while ambulating in shoes unable to trim herself.  Patient denies any new changes in medication or new problems. Patient denies any constitutional symptoms or any other pedal complaints at this time.  Past Medical History: As Per EPIC  Medications: As Per EPIC  Allergies: As Per EPIC   Objective: General: Patient is awake, alert, and oriented x 3 and in no acute distress.  Integument: Skin is warm, dry and supple bilateral. Nails are long, thickened and  dystrophic with subungual debris, consistent with onychomycosis, 1-5 bilateral with no signs of infection. No open lesions or preulcerative lesions present bilateral.   Vasculature:  Dorsalis Pedis pulse 1/4 bilateral. Posterior Tibial pulse  1/4 bilateral.  Capillary fill time <3 sec 1-5 bilateral.Diminished hair growth to the level of the digits. Temperature gradient within normal limits. Varicosities present bilateral. Mild trace edema present bilateral. No pain with calf compression, swelling, warmth or erythema.  Neurology: The patient has intact sensation measured with a 5.07/10g Semmes Weinstein Monofilament at all pedal sites bilateral . Vibratory sensation slightly diminished bilateral with tuning fork. No Babinski sign present bilateral.   Musculoskeletal: No gross pedal deformities noted bilateral. Muscular strength 5/5 in all lower extremity muscular groups bilateral.  Assessment and Plan: Problem List Items Addressed This Visit    None    Visit Diagnoses    Dermatophytosis of nail    -  Primary    Pain of toe, unspecified laterality        PVD (peripheral vascular disease) ([place])          -Examined patient. -Discussed and educated patient on foot care, especially with  regards to the vascular, neurological  and muschloskeletal systems.  -Mechanically debrided all nails 1-5 bilateral using sterile nail nipper without incident  -Answered all patient questions -Patient to return as needed or in 3 months for at risk foot care -Patient advised to call the office if any problems or questions arise in the  Meantime.  Landis Martins, DPM

## 2015-09-25 ENCOUNTER — Ambulatory Visit (INDEPENDENT_AMBULATORY_CARE_PROVIDER_SITE_OTHER): Payer: Medicare Other

## 2015-09-25 DIAGNOSIS — Z23 Encounter for immunization: Secondary | ICD-10-CM | POA: Diagnosis not present

## 2015-09-28 ENCOUNTER — Other Ambulatory Visit: Payer: Self-pay | Admitting: Family Medicine

## 2015-09-29 ENCOUNTER — Other Ambulatory Visit: Payer: Self-pay | Admitting: Cardiology

## 2015-09-29 ENCOUNTER — Other Ambulatory Visit: Payer: Self-pay | Admitting: Primary Care

## 2015-09-29 MED ORDER — VALSARTAN 80 MG PO TABS: 80.0000 mg | ORAL_TABLET | Freq: Every day | ORAL | Status: DC

## 2015-10-07 ENCOUNTER — Other Ambulatory Visit: Payer: Self-pay | Admitting: Primary Care

## 2015-10-07 NOTE — Telephone Encounter (Signed)
Electronically refill request for  ciprofloxacin (CIPRO) 250 MG tablet   Take 1 tablet (250 mg total) by mouth 2 (two) times daily.  Dispense: 14 tablet   Refills: 0     Was prescribed and seen for acute on 09/01/2015. Need office visit? She is Dr Marliss Coots patient.

## 2015-11-11 ENCOUNTER — Ambulatory Visit (INDEPENDENT_AMBULATORY_CARE_PROVIDER_SITE_OTHER): Payer: Medicare Other | Admitting: Family Medicine

## 2015-11-11 ENCOUNTER — Ambulatory Visit: Payer: Medicare Other | Admitting: Family Medicine

## 2015-11-11 ENCOUNTER — Encounter: Payer: Self-pay | Admitting: Family Medicine

## 2015-11-11 ENCOUNTER — Ambulatory Visit (INDEPENDENT_AMBULATORY_CARE_PROVIDER_SITE_OTHER)
Admission: RE | Admit: 2015-11-11 | Discharge: 2015-11-11 | Disposition: A | Discharge status: Home / Self Care | Payer: Medicare Other | Source: Ambulatory Visit | Attending: Family Medicine | Admitting: Family Medicine

## 2015-11-11 VITALS — BP 118/70 | HR 68 | Temp 97.7°F | Ht 60.0 in | Wt 156.8 lb

## 2015-11-11 DIAGNOSIS — R1030 Lower abdominal pain, unspecified: Secondary | ICD-10-CM

## 2015-11-11 DIAGNOSIS — R300 Dysuria: Secondary | ICD-10-CM

## 2015-11-11 DIAGNOSIS — R1032 Left lower quadrant pain: Secondary | ICD-10-CM

## 2015-11-11 DIAGNOSIS — N390 Urinary tract infection, site not specified: Secondary | ICD-10-CM | POA: Diagnosis not present

## 2015-11-11 DIAGNOSIS — M160 Bilateral primary osteoarthritis of hip: Secondary | ICD-10-CM | POA: Diagnosis not present

## 2015-11-11 LAB — CBC WITH DIFFERENTIAL/PLATELET
Basophils Absolute: 0 10*3/uL (ref 0.0–0.1)
Basophils Relative: 0 % (ref 0–1)
Eosinophils Absolute: 0.4 10*3/uL (ref 0.0–0.7)
Eosinophils Relative: 4 % (ref 0–5)
HCT: 35.5 % — ABNORMAL LOW (ref 36.0–46.0)
Hemoglobin: 11.7 g/dL — ABNORMAL LOW (ref 12.0–15.0)
Lymphocytes Relative: 12 % (ref 12–46)
Lymphs Abs: 1.2 10*3/uL (ref 0.7–4.0)
MCH: 30.2 pg (ref 26.0–34.0)
MCHC: 33 g/dL (ref 30.0–36.0)
MCV: 91.5 fL (ref 78.0–100.0)
MPV: 9.6 fL (ref 8.6–12.4)
Monocytes Absolute: 1.5 10*3/uL — ABNORMAL HIGH (ref 0.1–1.0)
Monocytes Relative: 14 % — ABNORMAL HIGH (ref 3–12)
Neutro Abs: 7.3 10*3/uL (ref 1.7–7.7)
Neutrophils Relative %: 70 % (ref 43–77)
Platelets: 319 10*3/uL (ref 150–400)
RBC: 3.88 MIL/uL (ref 3.87–5.11)
RDW: 14.1 % (ref 11.5–15.5)
WBC: 10.4 10*3/uL (ref 4.0–10.5)

## 2015-11-11 LAB — POCT URINALYSIS DIPSTICK
Bilirubin, UA: NEGATIVE
Glucose, UA: NEGATIVE
KETONES UA: NEGATIVE
NITRITE UA: NEGATIVE
PROTEIN UA: NEGATIVE
Spec Grav, UA: >=1.03
UROBILINOGEN UA: 0.2
pH, UA: 6

## 2015-11-11 MED ORDER — CIPROFLOXACIN HCL 250 MG PO TABS: 250.0000 mg | ORAL_TABLET | Freq: Two times a day (BID) | ORAL | Status: DC

## 2015-11-11 NOTE — Progress Notes (Signed)
Pre visit review using our clinic review tool, if applicable. No additional management support is needed unless otherwise documented below in the visit note. 

## 2015-11-11 NOTE — Progress Notes (Signed)
Subjective:    Patient ID: Ellen Peterson, female    DOB: 1920/06/24, 81 y.o.   MRN: [id number]  HPI Here for urinary symptoms   Her side hurts - L pelvic area and hurts to move her leg   Hurt to go to the bathroom  Burns -more than usual    Hx of recurrent uti  Leaks a lot - unsure of frequency/ just cannot hold urine Is on keflex now for proph  Last urine cx was enterobacter  sens to cipro-had 7 d on 09/01/15  UA today- mod rbc / otherwise fairly clear   Last week nauseated -no longer is  Is drinking enough fluids (watching that due to swelling)     Patient Active Problem List   Diagnosis Date Noted  . Abdominal pain, left lower quadrant 11/11/2015  . Left groin pain 11/11/2015  . Shortness of breath 08/20/2014  . DVT (deep venous thrombosis) ([place]) 07/23/2014  . CAD (coronary artery disease), native coronary artery 07/23/2014  . Pulmonary hypertension ([place]) 07/23/2014  . Right ventricular dysfunction 07/23/2014  . HX: anticoagulation 07/23/2014  . Dysphasia 07/23/2014  . Ejection fraction   . Essential hypertension, benign 06/22/2014  . UI (urinary incontinence) 06/22/2014  . GERD (gastroesophageal reflux disease) 06/22/2014  . Physical deconditioning 06/22/2014  . Bilateral pulmonary embolism ([place]) 06/15/2014  . Encounter for Medicare annual wellness exam 08/08/2013  . LBBB (left bundle branch block)   . Hyperlipidemia   . Wide-complex tachycardia ([place])   . Venous insufficiency 06/03/2011  . Hyperglycemia 11/17/2010  . Anemia 02/08/2010  . Renal insufficiency 10/27/2009  . Recurrent UTI 09/22/2008  . Vitamin D deficiency 09/10/2008  . COLONIC POLYPS, ADENOMATOUS 04/25/2007  . PERIPHERAL VASCULAR DISEASE 04/25/2007  . DIVERTICULOSIS, COLON 04/25/2007  . IBS 04/25/2007  . Osteoarthritis 04/25/2007  . POLYMYALGIA RHEUMATICA 04/25/2007   Past Medical History  Diagnosis Date  . Diabetes mellitus, type 2 ([place])   . Diverticulosis of colon   . GERD  (gastroesophageal reflux disease)   . Hyperlipidemia   . Hypertension   . OA (osteoarthritis)   . Peripheral vascular disease ([place])   . PMR (polymyalgia rheumatica) ([facility])   . Frequent UTI   . Chest tightness     catheterization, 2005, normal coronaries  . Wide-complex tachycardia ([facility])     SVT in past  . Hypokalemia   . LBBB (left bundle branch block)     Since at least 2005  . Ejection fraction < 50%     EF 40% echo, 09/2009  . Complication of anesthesia   . PONV (postoperative nausea and vomiting)   . PE (pulmonary embolism) 06/16/2014  . Shortness of breath   . Recurrent UTI    Past Surgical History  Procedure Laterality Date  . Appendectomy  1993  . Cholecystectomy    . Colectomy  1993    partial  . Total abdominal hysterectomy  1960s    fibroids   . Vein ligation and stripping    . Bladder tack up    . Back surgery    . Esophagogastroduodenoscopy  05/2004    baretts esophagus, stomach polyps  . Bilateral pyelonephritis    . Cardiac catheterization  2005    no significant CAD  . Tonsillectomy    . Left heart catheterization with coronary angiogram N/A 06/12/2014    Procedure: LEFT HEART CATHETERIZATION WITH CORONARY ANGIOGRAM;  Surgeon: Peter M Martinique, MD;  Location: [facility] CATH LAB;  Service: Cardiovascular;  Laterality: N/A;  Social History  Substance Use Topics  . Smoking status: Never Smoker   . Smokeless tobacco: Never Used  . Alcohol Use: No   Family History  Problem Relation Age of Onset  . CAD Mother   . Hypertension Mother   . Stroke Mother   . Pancreatic cancer Sister   . Diabetes Brother   . Heart Problems Brother   . Heart attack Mother   . Heart attack Brother    Allergies  Allergen Reactions  . Nitrofurantoin Hives  . Tetracycline     REACTION: Rash  . Erythromycin     REACTION: Rash  . Metformin     REACTION: vomiting and diarrhea  . Metronidazole     REACTION: whelps  . Nsaids     REACTION: GI symptoms  . Rofecoxib     REACTION:  GI symptoms  . Simvastatin     REACTION: myalgia  . Sulfamethoxazole-Trimethoprim     REACTION: itching   Current Outpatient Prescriptions on File Prior to Visit  Medication Sig Dispense Refill  . acetaminophen (TYLENOL) 500 MG tablet Take 500 mg by mouth every 6 (six) hours as needed (pain).     . cholecalciferol (VITAMIN D) 1000 UNITS tablet Take 1,000 Units by mouth daily.      Marland Kitchen CINNAMON PO Take 1,000 mg by mouth daily.    . diclofenac sodium (VOLTAREN) 1 % GEL Apply 2 g topically 2 (two) times daily as needed (knee pain). 6 Tube 3  . hydrochlorothiazide (HYDRODIURIL) 25 MG tablet TAKE 1 TABLET BY MOUTH DAILY 30 tablet 11  . isosorbide mononitrate (IMDUR) 30 MG 24 hr tablet TAKE 1 TABLET BY MOUTH DAILY 30 tablet 11  . metoprolol tartrate (LOPRESSOR) 25 MG tablet TAKE 1 TABLET BY MOUTH TWICE A DAY 60 tablet 11  . Multiple Vitamins-Minerals (ICAPS PO) Take 1 capsule by mouth daily.    . nitroGLYCERIN (NITROSTAT) 0.4 MG SL tablet Place 1 tablet (0.4 mg total) under the tongue every 5 (five) minutes as needed for chest pain. As directed and as needed. 10 tablet 3  . Omega-3 Fatty Acids (FISH OIL) 1000 MG CAPS Take 1,000 mg by mouth daily.    . pantoprazole (PROTONIX) 40 MG tablet Take 1 tablet (40 mg total) by mouth 2 (two) times daily. 180 tablet 3  . rivaroxaban (XARELTO) 15 MG TABS tablet Take 1 tablet (15 mg total) by mouth daily with supper. Starting 07/08/14 30 tablet 6  . traMADol (ULTRAM) 50 MG tablet TAKE 1 TABLET EVERY EIGHT HOURS AS NEEDED FOR PAIN 90 tablet 3  . valsartan (DIOVAN) 80 MG tablet Take 1 tablet (80 mg total) by mouth daily. 30 tablet 5  . vitamin E 400 UNIT capsule Take 400 Units by mouth daily.       No current facility-administered medications on file prior to visit.    Review of Systems Review of Systems  Constitutional: Negative for fever, appetite change,  and unexpected weight change. pos for fatigue and malaise  Eyes: Negative for pain and visual  disturbance.  Respiratory: Negative for cough and shortness of breath.   Cardiovascular: Negative for cp or palpitations    Gastrointestinal: Negative for , diarrhea and constipation. pos for mild nausea w/o vomiting pos for LLQ abd and groin pain  Genitourinary: pos for urgency and frequency. neg for dysuria or hematuria  Skin: Negative for pallor or rash   Neurological: Negative for weakness, light-headedness, numbness and headaches.  Hematological: Negative for adenopathy. Does not bruise/bleed  easily.  Psychiatric/Behavioral: Negative for dysphoric mood. The patient is not nervous/anxious.         Objective:   Physical Exam  Constitutional: She appears well-developed and well-nourished. No distress.  overwt and well appearing elderly female  HENT:  Head: Normocephalic and atraumatic.  Eyes: Conjunctivae and EOM are normal. Pupils are equal, round, and reactive to light.  Neck: Normal range of motion. Neck supple.  Cardiovascular: Normal rate, regular rhythm and normal heart sounds.   Pulmonary/Chest: Effort normal and breath sounds normal. No respiratory distress. She has no wheezes. She has no rales.  Abdominal: Soft. Bowel sounds are normal. She exhibits no distension, no pulsatile liver, no abdominal bruit, no ascites and no mass. There is no hepatosplenomegaly. There is tenderness in the left lower quadrant. There is no rigidity, no rebound, no guarding, no CVA tenderness, no tenderness at McBurney's point and negative Murphy's sign.  No cva tenderness  Mild suprapubic tenderness  Musculoskeletal: She exhibits no edema.  Pain in L groin with rotation of hip   Lymphadenopathy:    She has no cervical adenopathy.  Neurological: She is alert. She has normal reflexes. No cranial nerve deficit. She exhibits normal muscle tone. Coordination normal.  Skin: Skin is warm and dry. No rash noted.  Psychiatric: She has a normal mood and affect.          Assessment & Plan:    Problem List Items Addressed This Visit      Genitourinary   Recurrent UTI - Primary    Pt takes prophylaxis- keflex  Hx of renal insuff Has seen urology  Having urinary symptoms and worse incontinence ua is fairly bland today  Sent for cx  Px for low dose cipro given to hold for results - but will update if symptoms worsen Unsure if related to her LLQ abd /groin pain at this point       Relevant Orders   Urine culture (Completed)     Other   Abdominal pain, left lower quadrant    LLQ and groin along with urinary symptoms and hx of diverticular dz in elderly female  Lab today for cbc Urine cx sent  xr of L hip   Px for low dose cipro given to hold until lab returns (does not tolerate flagyl)       Relevant Orders   CBC with Differential/Platelet (Completed)   Left groin pain    XR of hip today  Disc diff dx for this and LLQ pain incl hip OA and uti and diverticular dz        Relevant Orders   DG HIP UNILAT W OR W/O PELVIS 2-3 VIEWS LEFT (Completed)    Other Visit Diagnoses    Dysuria        Relevant Orders    POCT urinalysis dipstick (Completed)

## 2015-11-11 NOTE — Patient Instructions (Addendum)
Lab today to look at blood count - in case of infection (urinary or diverticulitis)  Urine culture pending  Xray of hip today in light of the groin pain    We will contact you with results   Hold cipro-do not fill unless we tell you to

## 2015-11-14 LAB — URINE CULTURE: Colony Count: 70000

## 2015-11-15 NOTE — Assessment & Plan Note (Signed)
LLQ and groin along with urinary symptoms and hx of diverticular dz in elderly female  Lab today for cbc Urine cx sent  xr of L hip   Px for low dose cipro given to hold until lab returns (does not tolerate flagyl)

## 2015-11-15 NOTE — Assessment & Plan Note (Signed)
Pt takes prophylaxis- keflex  Hx of renal insuff Has seen urology  Having urinary symptoms and worse incontinence ua is fairly bland today  Sent for cx  Px for low dose cipro given to hold for results - but will update if symptoms worsen Unsure if related to her LLQ abd /groin pain at this point

## 2015-11-15 NOTE — Assessment & Plan Note (Signed)
XR of hip today  Disc diff dx for this and LLQ pain incl hip OA and uti and diverticular dz

## 2015-11-16 ENCOUNTER — Telehealth: Payer: Self-pay | Admitting: Family Medicine

## 2015-11-16 NOTE — Telephone Encounter (Signed)
Patient's daughter,Linda,returned Shapale's call.

## 2015-11-16 NOTE — Telephone Encounter (Signed)
Left detailed voicemail with Dr. Marliss Coots comments for daughter

## 2015-11-19 ENCOUNTER — Telehealth: Payer: Self-pay | Admitting: *Deleted

## 2015-11-19 DIAGNOSIS — K529 Noninfective gastroenteritis and colitis, unspecified: Secondary | ICD-10-CM

## 2015-11-19 HISTORY — DX: Noninfective gastroenteritis and colitis, unspecified: K52.9

## 2015-11-19 NOTE — Telephone Encounter (Signed)
Patient requests samples of Xarelto 15mg . I will place at the front desk for pick up.

## 2015-11-27 ENCOUNTER — Emergency Department (HOSPITAL_COMMUNITY): Payer: Medicare Other

## 2015-11-27 ENCOUNTER — Encounter (HOSPITAL_COMMUNITY): Payer: Self-pay | Admitting: Family Medicine

## 2015-11-27 ENCOUNTER — Encounter: Payer: Self-pay | Admitting: Cardiology

## 2015-11-27 ENCOUNTER — Inpatient Hospital Stay (HOSPITAL_COMMUNITY)
Admission: EM | Admit: 2015-11-27 | Discharge: 2015-12-01 | DRG: 385 | Disposition: A | Discharge status: Skilled Nursing Facility | Payer: Medicare Other | Attending: Internal Medicine | Admitting: Internal Medicine

## 2015-11-27 DIAGNOSIS — E876 Hypokalemia: Secondary | ICD-10-CM | POA: Diagnosis not present

## 2015-11-27 DIAGNOSIS — R933 Abnormal findings on diagnostic imaging of other parts of digestive tract: Secondary | ICD-10-CM | POA: Diagnosis not present

## 2015-11-27 DIAGNOSIS — I739 Peripheral vascular disease, unspecified: Secondary | ICD-10-CM | POA: Diagnosis present

## 2015-11-27 DIAGNOSIS — R1110 Vomiting, unspecified: Secondary | ICD-10-CM | POA: Diagnosis not present

## 2015-11-27 DIAGNOSIS — K317 Polyp of stomach and duodenum: Secondary | ICD-10-CM | POA: Diagnosis not present

## 2015-11-27 DIAGNOSIS — R1012 Left upper quadrant pain: Secondary | ICD-10-CM | POA: Diagnosis not present

## 2015-11-27 DIAGNOSIS — R1032 Left lower quadrant pain: Secondary | ICD-10-CM | POA: Diagnosis not present

## 2015-11-27 DIAGNOSIS — N390 Urinary tract infection, site not specified: Secondary | ICD-10-CM | POA: Diagnosis present

## 2015-11-27 DIAGNOSIS — D72829 Elevated white blood cell count, unspecified: Secondary | ICD-10-CM | POA: Diagnosis not present

## 2015-11-27 DIAGNOSIS — R262 Difficulty in walking, not elsewhere classified: Secondary | ICD-10-CM | POA: Diagnosis not present

## 2015-11-27 DIAGNOSIS — R059 Cough, unspecified: Secondary | ICD-10-CM

## 2015-11-27 DIAGNOSIS — Z881 Allergy status to other antibiotic agents status: Secondary | ICD-10-CM | POA: Diagnosis not present

## 2015-11-27 DIAGNOSIS — Z86711 Personal history of pulmonary embolism: Secondary | ICD-10-CM

## 2015-11-27 DIAGNOSIS — K529 Noninfective gastroenteritis and colitis, unspecified: Secondary | ICD-10-CM | POA: Diagnosis not present

## 2015-11-27 DIAGNOSIS — I130 Hypertensive heart and chronic kidney disease with heart failure and stage 1 through stage 4 chronic kidney disease, or unspecified chronic kidney disease: Secondary | ICD-10-CM | POA: Diagnosis present

## 2015-11-27 DIAGNOSIS — I5032 Chronic diastolic (congestive) heart failure: Secondary | ICD-10-CM | POA: Diagnosis not present

## 2015-11-27 DIAGNOSIS — K5000 Crohn's disease of small intestine without complications: Secondary | ICD-10-CM | POA: Diagnosis not present

## 2015-11-27 DIAGNOSIS — Z66 Do not resuscitate: Secondary | ICD-10-CM | POA: Diagnosis present

## 2015-11-27 DIAGNOSIS — N183 Chronic kidney disease, stage 3 unspecified: Secondary | ICD-10-CM | POA: Diagnosis present

## 2015-11-27 DIAGNOSIS — K219 Gastro-esophageal reflux disease without esophagitis: Secondary | ICD-10-CM | POA: Diagnosis not present

## 2015-11-27 DIAGNOSIS — I272 Other secondary pulmonary hypertension: Secondary | ICD-10-CM | POA: Diagnosis not present

## 2015-11-27 DIAGNOSIS — M1990 Unspecified osteoarthritis, unspecified site: Secondary | ICD-10-CM | POA: Diagnosis not present

## 2015-11-27 DIAGNOSIS — Z87440 Personal history of urinary (tract) infections: Secondary | ICD-10-CM | POA: Diagnosis not present

## 2015-11-27 DIAGNOSIS — N179 Acute kidney failure, unspecified: Secondary | ICD-10-CM | POA: Diagnosis present

## 2015-11-27 DIAGNOSIS — K50019 Crohn's disease of small intestine with unspecified complications: Secondary | ICD-10-CM | POA: Diagnosis not present

## 2015-11-27 DIAGNOSIS — D7289 Other specified disorders of white blood cells: Secondary | ICD-10-CM | POA: Diagnosis not present

## 2015-11-27 DIAGNOSIS — E119 Type 2 diabetes mellitus without complications: Secondary | ICD-10-CM

## 2015-11-27 DIAGNOSIS — K5090 Crohn's disease, unspecified, without complications: Secondary | ICD-10-CM | POA: Diagnosis not present

## 2015-11-27 DIAGNOSIS — I2510 Atherosclerotic heart disease of native coronary artery without angina pectoris: Secondary | ICD-10-CM | POA: Diagnosis present

## 2015-11-27 DIAGNOSIS — M353 Polymyalgia rheumatica: Secondary | ICD-10-CM | POA: Diagnosis present

## 2015-11-27 DIAGNOSIS — K589 Irritable bowel syndrome without diarrhea: Secondary | ICD-10-CM | POA: Diagnosis present

## 2015-11-27 DIAGNOSIS — Z888 Allergy status to other drugs, medicaments and biological substances status: Secondary | ICD-10-CM

## 2015-11-27 DIAGNOSIS — I10 Essential (primary) hypertension: Secondary | ICD-10-CM | POA: Diagnosis not present

## 2015-11-27 DIAGNOSIS — Z86718 Personal history of other venous thrombosis and embolism: Secondary | ICD-10-CM | POA: Diagnosis not present

## 2015-11-27 DIAGNOSIS — Z7901 Long term (current) use of anticoagulants: Secondary | ICD-10-CM

## 2015-11-27 DIAGNOSIS — E785 Hyperlipidemia, unspecified: Secondary | ICD-10-CM | POA: Diagnosis not present

## 2015-11-27 DIAGNOSIS — R112 Nausea with vomiting, unspecified: Secondary | ICD-10-CM | POA: Diagnosis not present

## 2015-11-27 DIAGNOSIS — R278 Other lack of coordination: Secondary | ICD-10-CM | POA: Diagnosis not present

## 2015-11-27 DIAGNOSIS — R1314 Dysphagia, pharyngoesophageal phase: Secondary | ICD-10-CM | POA: Diagnosis not present

## 2015-11-27 DIAGNOSIS — J189 Pneumonia, unspecified organism: Secondary | ICD-10-CM | POA: Diagnosis present

## 2015-11-27 DIAGNOSIS — K449 Diaphragmatic hernia without obstruction or gangrene: Secondary | ICD-10-CM | POA: Diagnosis present

## 2015-11-27 DIAGNOSIS — I447 Left bundle-branch block, unspecified: Secondary | ICD-10-CM | POA: Diagnosis not present

## 2015-11-27 DIAGNOSIS — R05 Cough: Secondary | ICD-10-CM

## 2015-11-27 DIAGNOSIS — E1122 Type 2 diabetes mellitus with diabetic chronic kidney disease: Secondary | ICD-10-CM | POA: Diagnosis not present

## 2015-11-27 DIAGNOSIS — M6281 Muscle weakness (generalized): Secondary | ICD-10-CM | POA: Diagnosis not present

## 2015-11-27 DIAGNOSIS — K2970 Gastritis, unspecified, without bleeding: Secondary | ICD-10-CM | POA: Diagnosis not present

## 2015-11-27 DIAGNOSIS — M069 Rheumatoid arthritis, unspecified: Secondary | ICD-10-CM | POA: Diagnosis not present

## 2015-11-27 DIAGNOSIS — Z8249 Family history of ischemic heart disease and other diseases of the circulatory system: Secondary | ICD-10-CM

## 2015-11-27 DIAGNOSIS — Z833 Family history of diabetes mellitus: Secondary | ICD-10-CM | POA: Diagnosis not present

## 2015-11-27 DIAGNOSIS — Z886 Allergy status to analgesic agent status: Secondary | ICD-10-CM

## 2015-11-27 DIAGNOSIS — Z79899 Other long term (current) drug therapy: Secondary | ICD-10-CM | POA: Diagnosis not present

## 2015-11-27 DIAGNOSIS — D649 Anemia, unspecified: Secondary | ICD-10-CM | POA: Diagnosis present

## 2015-11-27 HISTORY — DX: Noninfective gastroenteritis and colitis, unspecified: K52.9

## 2015-11-27 LAB — CBC WITH DIFFERENTIAL/PLATELET
Basophils Absolute: 0 10*3/uL (ref 0.0–0.1)
Basophils Relative: 0 %
EOS ABS: 0 10*3/uL (ref 0.0–0.7)
EOS PCT: 0 %
HCT: 35.4 % — ABNORMAL LOW (ref 36.0–46.0)
Hemoglobin: 11.8 g/dL — ABNORMAL LOW (ref 12.0–15.0)
LYMPHS ABS: 1.3 10*3/uL (ref 0.7–4.0)
LYMPHS PCT: 8 %
MCH: 30.3 pg (ref 26.0–34.0)
MCHC: 33.3 g/dL (ref 30.0–36.0)
MCV: 91 fL (ref 78.0–100.0)
MONO ABS: 1.2 10*3/uL — AB (ref 0.1–1.0)
Monocytes Relative: 7 %
Neutro Abs: 15.1 10*3/uL — ABNORMAL HIGH (ref 1.7–7.7)
Neutrophils Relative %: 85 %
PLATELETS: 288 10*3/uL (ref 150–400)
RBC: 3.89 MIL/uL (ref 3.87–5.11)
RDW: 13.9 % (ref 11.5–15.5)
WBC: 17.6 10*3/uL — AB (ref 4.0–10.5)

## 2015-11-27 LAB — COMPREHENSIVE METABOLIC PANEL
ALK PHOS: 54 U/L (ref 38–126)
ALT: 21 U/L (ref 14–54)
ANION GAP: 12 (ref 5–15)
AST: 28 U/L (ref 15–41)
Albumin: 3.3 g/dL — ABNORMAL LOW (ref 3.5–5.0)
BILIRUBIN TOTAL: 1.3 mg/dL — AB (ref 0.3–1.2)
BUN: 23 mg/dL — ABNORMAL HIGH (ref 6–20)
CALCIUM: 9.2 mg/dL (ref 8.9–10.3)
CO2: 24 mmol/L (ref 22–32)
CREATININE: 1.28 mg/dL — AB (ref 0.44–1.00)
Chloride: 101 mmol/L (ref 101–111)
GFR calc non Af Amer: 35 mL/min — ABNORMAL LOW (ref >60)
GFR, EST AFRICAN AMERICAN: 40 mL/min — AB (ref >60)
GLUCOSE: 140 mg/dL — AB (ref 65–99)
Potassium: 3.6 mmol/L (ref 3.5–5.1)
Sodium: 137 mmol/L (ref 135–145)
TOTAL PROTEIN: 6.7 g/dL (ref 6.5–8.1)

## 2015-11-27 LAB — URINALYSIS, ROUTINE W REFLEX MICROSCOPIC
BILIRUBIN URINE: NEGATIVE
GLUCOSE, UA: NEGATIVE mg/dL
HGB URINE DIPSTICK: NEGATIVE
KETONES UR: 15 mg/dL — AB
Leukocytes, UA: NEGATIVE
Nitrite: NEGATIVE
PROTEIN: NEGATIVE mg/dL
Specific Gravity, Urine: 1.016 (ref 1.005–1.030)
pH: 5 (ref 5.0–8.0)

## 2015-11-27 LAB — TSH: TSH: 0.528 u[IU]/mL (ref 0.350–4.500)

## 2015-11-27 LAB — GLUCOSE, CAPILLARY
GLUCOSE-CAPILLARY: 111 mg/dL — AB (ref 65–99)
Glucose-Capillary: 114 mg/dL — ABNORMAL HIGH (ref 65–99)

## 2015-11-27 LAB — TROPONIN I: TROPONIN I: 0.04 ng/mL — AB (ref <0.031)

## 2015-11-27 LAB — LIPASE, BLOOD: Lipase: 30 U/L (ref 11–51)

## 2015-11-27 MED ORDER — ACETAMINOPHEN 650 MG RE SUPP: 650.0000 mg | Freq: Four times a day (QID) | RECTAL | Status: DC | PRN

## 2015-11-27 MED ORDER — ENOXAPARIN SODIUM 80 MG/0.8ML ~~LOC~~ SOLN
1.0000 mg/kg | SUBCUTANEOUS | Status: DC
  Administered 2015-11-27: 70 mg via SUBCUTANEOUS
  Filled 2015-11-27: qty 0.8

## 2015-11-27 MED ORDER — ONDANSETRON HCL 4 MG PO TABS
4.0000 mg | ORAL_TABLET | Freq: Four times a day (QID) | ORAL | Status: DC | PRN
  Administered 2015-11-27: 4 mg via ORAL
  Filled 2015-11-27: qty 1

## 2015-11-27 MED ORDER — IOHEXOL 300 MG/ML  SOLN
75.0000 mL | Freq: Once | INTRAMUSCULAR | Status: AC | PRN
  Administered 2015-11-27: 75 mL via INTRAVENOUS

## 2015-11-27 MED ORDER — RIVAROXABAN 15 MG PO TABS: 15.0000 mg | ORAL_TABLET | Freq: Every day | ORAL | Status: DC

## 2015-11-27 MED ORDER — LEVOFLOXACIN IN D5W 250 MG/50ML IV SOLN
250.0000 mg | Freq: Once | INTRAVENOUS | Status: AC
  Administered 2015-11-27: 250 mg via INTRAVENOUS
  Filled 2015-11-27: qty 50

## 2015-11-27 MED ORDER — LEVOFLOXACIN IN D5W 750 MG/150ML IV SOLN: 750.0000 mg | INTRAVENOUS | Status: DC

## 2015-11-27 MED ORDER — POTASSIUM CHLORIDE IN NACL 20-0.9 MEQ/L-% IV SOLN
INTRAVENOUS | Status: DC
  Administered 2015-11-27: 14:00:00 via INTRAVENOUS
  Filled 2015-11-27 (×2): qty 1000

## 2015-11-27 MED ORDER — SODIUM CHLORIDE 0.9 % IV SOLN
INTRAVENOUS | Status: DC
  Administered 2015-11-27: 11:00:00 via INTRAVENOUS

## 2015-11-27 MED ORDER — LEVOFLOXACIN IN D5W 500 MG/100ML IV SOLN: 500.0000 mg | INTRAVENOUS | Status: DC

## 2015-11-27 MED ORDER — INSULIN ASPART 100 UNIT/ML ~~LOC~~ SOLN: 0.0000 [IU] | Freq: Every day | SUBCUTANEOUS | Status: DC

## 2015-11-27 MED ORDER — SODIUM CHLORIDE 0.9 % IV SOLN
INTRAVENOUS | Status: DC
  Administered 2015-11-27 – 2015-11-29 (×4): via INTRAVENOUS

## 2015-11-27 MED ORDER — ONDANSETRON HCL 4 MG/2ML IJ SOLN: 4.0000 mg | Freq: Four times a day (QID) | INTRAMUSCULAR | Status: DC | PRN

## 2015-11-27 MED ORDER — SODIUM CHLORIDE 0.9 % IV BOLUS (SEPSIS)
500.0000 mL | Freq: Once | INTRAVENOUS | Status: AC
  Administered 2015-11-27: 500 mL via INTRAVENOUS

## 2015-11-27 MED ORDER — MORPHINE SULFATE (PF) 2 MG/ML IV SOLN: 1.0000 mg | INTRAVENOUS | Status: DC | PRN

## 2015-11-27 MED ORDER — ACETAMINOPHEN 325 MG PO TABS
650.0000 mg | ORAL_TABLET | Freq: Four times a day (QID) | ORAL | Status: DC | PRN
  Administered 2015-11-27 – 2015-11-29 (×3): 650 mg via ORAL
  Filled 2015-11-27 (×3): qty 2

## 2015-11-27 MED ORDER — SODIUM CHLORIDE 0.9 % IV SOLN: INTRAVENOUS | Status: AC

## 2015-11-27 MED ORDER — INSULIN ASPART 100 UNIT/ML ~~LOC~~ SOLN
0.0000 [IU] | Freq: Three times a day (TID) | SUBCUTANEOUS | Status: DC
  Administered 2015-11-30: 1 [IU] via SUBCUTANEOUS

## 2015-11-27 MED ORDER — LEVOFLOXACIN IN D5W 500 MG/100ML IV SOLN
500.0000 mg | Freq: Once | INTRAVENOUS | Status: AC
  Administered 2015-11-27: 500 mg via INTRAVENOUS
  Filled 2015-11-27: qty 100

## 2015-11-27 MED ORDER — METOPROLOL TARTRATE 25 MG PO TABS
25.0000 mg | ORAL_TABLET | Freq: Two times a day (BID) | ORAL | Status: DC
  Administered 2015-11-27 – 2015-12-01 (×8): 25 mg via ORAL
  Filled 2015-11-27 (×8): qty 1

## 2015-11-27 MED ORDER — ENOXAPARIN SODIUM 80 MG/0.8ML ~~LOC~~ SOLN: 1.0000 mg/kg | SUBCUTANEOUS | Status: DC

## 2015-11-27 NOTE — Progress Notes (Signed)
CrCl calculated by pharmacy <30 so have dc'd Xarelto in favor of full dose Lovenox. If CrCl improves to prior baseline then can resume Xarelto o/w pt will need to be started on an alternate agent.  Erin Hearing, ANP

## 2015-11-27 NOTE — H&P (Signed)
[facility] History and Physical                                                                                    Ellen Peterson, is a 81 y.o. female  MRN: [id number]   DOB - 11-21-1920  Admit Date - 11/27/2015  Outpatient Primary MD for the patient is [name], MD  Referring MD: Johnney Killian / ER  Consulting M.D: Benson Norway / GI  With History of -  Past Medical History  Diagnosis Date  . Diabetes mellitus, type 2 ([place])   . Diverticulosis of colon   . GERD (gastroesophageal reflux disease)   . Hyperlipidemia   . Hypertension   . OA (osteoarthritis)   . Peripheral vascular disease ([place])   . PMR (polymyalgia rheumatica) ([facility])   . Frequent UTI   . Chest tightness     catheterization, 2005, normal coronaries  . Wide-complex tachycardia ([facility])     SVT in past  . Hypokalemia   . LBBB (left bundle branch block)     Since at least 2005  . Ejection fraction < 50%     EF 40% echo, 09/2009  . Complication of anesthesia   . PONV (postoperative nausea and vomiting)   . PE (pulmonary embolism) 06/16/2014  . Shortness of breath   . Recurrent UTI       Past Surgical History  Procedure Laterality Date  . Appendectomy  1993  . Cholecystectomy    . Colectomy  1993    partial  . Total abdominal hysterectomy  1960s    fibroids   . Vein ligation and stripping    . Bladder tack up    . Back surgery    . Esophagogastroduodenoscopy  05/2004    baretts esophagus, stomach polyps  . Bilateral pyelonephritis    . Cardiac catheterization  2005    no significant CAD  . Tonsillectomy    . Left heart catheterization with coronary angiogram N/A 06/12/2014    Procedure: LEFT HEART CATHETERIZATION WITH CORONARY ANGIOGRAM;  Surgeon: Peter M Martinique, MD;  Location: [facility] CATH LAB;  Service: Cardiovascular;  Laterality: N/A;    in for   Chief Complaint  Patient presents with  . Abdominal Pain     HPI This is a 59 old female patient with past medical history of diet-controlled diabetes,  remote colonic diverticulosis, GERD, dyslipidemia, hypertension, ongoing osteoarthritis, peripheral asked her disease, polymalgia rheumatica not on chronic steroids, recurrent UTI, left bundle-branch block and known CAD, strict DVT and PE own Xarelto who presents to the hospital because of ongoing left-sided abdominal pain now associated with nausea and vomiting. Patient initially presented to her primary care physician on 11/23 with complaints of left or quadrant abdominal pain. Given her history of recurrent UTI and known diverticular disease patient was empirically treated with Cipro (as hives secondary to Flagyl so was not ordered). Urinalysis was bland but urine cultured did demonstrate Enterobacter resistant to Augmentin and cefazolin but was sensitive to Cipro (with an MIC of less than 0.25). Patient does admit to missing some of the doses of her Cipro. Unfortunately despite taking this medication mostly as prescribed patient has had worsening  of her left-sided abdominal pain, decreased frequency of bowel movements, and now a new problem of nausea and vomiting and postprandial bloating and abdominal pain. Patient has chronic loose stools which are unchanged. She has not had any blood in her stools or emesis. Petra Kuba is not experiencing any dysuria symptoms. She is not had any fevers at home. Her last BM was initially reported as 5 days ago but then patient reported to me during history that she had a loose bowel movement yesterday.  In the ER the patient was afebrile and hemodynamically stable, mildly tachycardic with heart rates between 95 and 109 bpm. Respirations normal. Room air saturation is 98%. Laboratory data revealed chronic kidney disease stage III with mild azotemia which is chronic with BUN 23 and creatinine 1.28 noting patient is on thiazide diuretic chronically. Albumin slightly low at 3.3, total bilirubin is likely elevated 1.3, mildly elevated troponin 1 and 0.04 and absence of any chest  pain or other cardiac symptomatology. White count was elevated at 17,600, with neutrophils 85% absolute neutrophils 15%. CV abdomen and pelvis with contrast revealed abnormal jejunal pathology left upper quadrant that could either be due to enteritis or jejunal mass although findings were not definitive. Recommendations were for further evaluation by CT enterography in 3-4 weeks to evaluate more clearly. In the ER patient has been given 500 mL of normal saline, 500 mg of IV Levaquin.   Review of Systems   In addition to the HPI above,  No Fever-chills, myalgias or other constitutional symptoms No Headache, changes with Vision or hearing, new weakness, tingling, numbness in any extremity, No problems swallowing food or Liquids, indigestion/reflux No Chest pain, Cough or Shortness of Breath, palpitations, orthopnea or DOE No melena or hematochezia, no dark tarry stools No dysuria, hematuria or flank pain No new skin rashes, lesions, masses or bruises, No new joints pains-aches No recent weight gain or loss No polyuria, polydypsia or polyphagia,  *A full 10 point Review of Systems was done, except as stated above, all other Review of Systems were negative.  Social History Social History  Substance Use Topics  . Smoking status: Never Smoker   . Smokeless tobacco: Never Used  . Alcohol Use: No    Resides at: Private residence  Lives with: Daughter  Ambulatory status: Rolling walker   Family History Family History  Problem Relation Age of Onset  . CAD Mother   . Hypertension Mother   . Stroke Mother   . Pancreatic cancer Sister   . Diabetes Brother   . Heart Problems Brother   . Heart attack Mother   . Heart attack Brother      Prior to Admission medications   Medication Sig Start Date End Date Taking? Authorizing Provider  acetaminophen (TYLENOL) 500 MG tablet Take 500 mg by mouth every 6 (six) hours as needed (pain).     Historical Provider, MD  cholecalciferol (VITAMIN  D) 1000 UNITS tablet Take 1,000 Units by mouth daily.      Historical Provider, MD  CINNAMON PO Take 1,000 mg by mouth daily.    Historical Provider, MD  ciprofloxacin (CIPRO) 250 MG tablet Take 1 tablet (250 mg total) by mouth 2 (two) times daily. 11/11/15   Abner Greenspan, MD  diclofenac sodium (VOLTAREN) 1 % GEL Apply 2 g topically 2 (two) times daily as needed (knee pain). 01/06/15   Abner Greenspan, MD  hydrochlorothiazide (HYDRODIURIL) 25 MG tablet TAKE 1 TABLET BY MOUTH DAILY 06/04/15   Leonie Douglas  Ron Parker, MD  isosorbide mononitrate (IMDUR) 30 MG 24 hr tablet TAKE 1 TABLET BY MOUTH DAILY 06/04/15   Carlena Bjornstad, MD  metoprolol tartrate (LOPRESSOR) 25 MG tablet TAKE 1 TABLET BY MOUTH TWICE A DAY 06/04/15   Carlena Bjornstad, MD  Multiple Vitamins-Minerals (ICAPS PO) Take 1 capsule by mouth daily.    Historical Provider, MD  nitroGLYCERIN (NITROSTAT) 0.4 MG SL tablet Place 1 tablet (0.4 mg total) under the tongue every 5 (five) minutes as needed for chest pain. As directed and as needed. 07/11/14   Abner Greenspan, MD  Omega-3 Fatty Acids (FISH OIL) 1000 MG CAPS Take 1,000 mg by mouth daily.    Historical Provider, MD  pantoprazole (PROTONIX) 40 MG tablet Take 1 tablet (40 mg total) by mouth 2 (two) times daily. 01/06/15   Abner Greenspan, MD  rivaroxaban (XARELTO) 15 MG TABS tablet Take 1 tablet (15 mg total) by mouth daily with supper. Starting 07/08/14 04/08/15   Carlena Bjornstad, MD  traMADol (ULTRAM) 50 MG tablet TAKE 1 TABLET EVERY EIGHT HOURS AS NEEDED FOR PAIN 07/01/15   Abner Greenspan, MD  valsartan (DIOVAN) 80 MG tablet Take 1 tablet (80 mg total) by mouth daily. 09/29/15   Carlena Bjornstad, MD  vitamin E 400 UNIT capsule Take 400 Units by mouth daily.      Historical Provider, MD    Allergies  Allergen Reactions  . Nitrofurantoin Hives  . Tetracycline     REACTION: Rash  . Erythromycin     REACTION: Rash  . Metformin     REACTION: vomiting and diarrhea  . Metronidazole     REACTION: whelps  .  Nsaids     REACTION: GI symptoms  . Rofecoxib     REACTION: GI symptoms  . Simvastatin     REACTION: myalgia  . Sulfamethoxazole-Trimethoprim     REACTION: itching    Physical Exam  Vitals  Blood pressure 138/56, pulse 105, temperature 98.8 F (37.1 C), temperature source Oral, resp. rate 14, SpO2 96 %.   General:  In mild acute distress as evidenced by ongoing abdominal pain, appears stated age  Psych:  Normal affect, Denies Suicidal or Homicidal ideations, Awake Alert, Oriented X 3. Speech and thought patterns are clear and appropriate  Neuro:   No focal neurological deficits, CN II through XII intact, Strength 5/5 all 4 extremities, Sensation intact all 4 extremities.  ENT:  Ears and Eyes appear Normal, Conjunctivae clear, PER. Dry oral mucosa without erythema or exudates.  Neck:  Supple, No lymphadenopathy appreciated  Respiratory:  Symmetrical chest wall movement, Good air movement bilaterally, CTAB. Room Air  Cardiac:  RRR, No Murmurs, no LE edema noted, no JVD, No carotid bruits, peripheral pulses palpable at 2+  Abdomen:  Positive bowel sounds, Soft, focally tender. Umbilical region as well as in left lower quadrant and left flank but no guarding or rebounding, Non distended,  No masses appreciated, no obvious hepatosplenomegaly  Skin:  No Cyanosis, Normal Skin Turgor, No Skin Rash or Bruise.  Extremities: Symmetrical without obvious trauma or injury,  no effusions.  Data Review  CBC  Recent Labs Lab 11/27/15 1048  WBC 17.6*  HGB 11.8*  HCT 35.4*  PLT 288  MCV 91.0  MCH 30.3  MCHC 33.3  RDW 13.9  LYMPHSABS 1.3  MONOABS 1.2*  EOSABS 0.0  BASOSABS 0.0    Chemistries   Recent Labs Lab 11/27/15 1048  NA 137  K 3.6  CL  101  CO2 24  GLUCOSE 140*  BUN 23*  CREATININE 1.28*  CALCIUM 9.2  AST 28  ALT 21  ALKPHOS 54  BILITOT 1.3*    CrCl cannot be calculated (Unknown ideal weight.).  No results for input(s): TSH, T4TOTAL, T3FREE, THYROIDAB  in the last 72 hours.  Invalid input(s): FREET3  Coagulation profile No results for input(s): INR, PROTIME in the last 168 hours.  No results for input(s): DDIMER in the last 72 hours.  Cardiac Enzymes  Recent Labs Lab 11/27/15 1048  TROPONINI 0.04*    Invalid input(s): POCBNP  Urinalysis    Component Value Date/Time   COLORURINE YELLOW 11/27/2015 1112   COLORURINE YELLOW 07/02/2014 0011   APPEARANCEUR CLEAR 11/27/2015 1112   APPEARANCEUR HAZY 07/02/2014 0011   LABSPEC 1.016 11/27/2015 1112   LABSPEC 1.010 07/02/2014 0011   PHURINE 5.0 11/27/2015 1112   PHURINE 5.0 07/02/2014 0011   GLUCOSEU NEGATIVE 11/27/2015 1112   GLUCOSEU NEGATIVE 07/02/2014 0011   HGBUR NEGATIVE 11/27/2015 1112   HGBUR 3+ 07/02/2014 0011   HGBUR moderate 09/20/2010 1406   BILIRUBINUR NEGATIVE 11/27/2015 1112   BILIRUBINUR Neg. 11/11/2015 1424   BILIRUBINUR NEGATIVE 07/02/2014 0011   KETONESUR 15* 11/27/2015 1112   KETONESUR NEGATIVE 07/02/2014 0011   PROTEINUR NEGATIVE 11/27/2015 1112   PROTEINUR Neg. 11/11/2015 1424   PROTEINUR 30 mg/dL 07/02/2014 0011   UROBILINOGEN 0.2 11/11/2015 1424   UROBILINOGEN 0.2 07/21/2011 0049   NITRITE NEGATIVE 11/27/2015 1112   NITRITE Neg. 11/11/2015 1424   NITRITE POSITIVE 07/02/2014 0011   LEUKOCYTESUR NEGATIVE 11/27/2015 1112   LEUKOCYTESUR 3+ 07/02/2014 0011    Imaging results:   Ct Abdomen Pelvis W Contrast  11/27/2015  CLINICAL DATA:  Nausea and vomiting since yesterday. History of previous cholecystectomy. EXAM: CT ABDOMEN AND PELVIS WITH CONTRAST TECHNIQUE: Multidetector CT imaging of the abdomen and pelvis was performed using the standard protocol following bolus administration of intravenous contrast. CONTRAST:  71mL OMNIPAQUE IOHEXOL 300 MG/ML  SOLN COMPARISON:  10/12/2009 an multiple previous FINDINGS: There is mild scarring and pleural thickening at the left base. No pericardial fluid. The liver has normal appearance. Previous cholecystectomy.  The spleen is normal. The pancreas is normal. The adrenal glands are normal. The kidneys are normal, with multiple parapelvic cysts bilaterally. There may be mild renal atrophy on the left. The aorta shows atherosclerosis but no aneurysm. The IVC is normal. No retroperitoneal mass or adenopathy. No free intraperitoneal fluid or air. No evidence of obstruction. Have some concern about the appearance of the jejunum in the region just past the ligament of Treitz. The bowel appears questionably dilated and the contents appear somewhat unusual for the proximal small bowel. I question some stranding in the mesentery and the presence of some small regional lymph nodes. This raises the possibility of jejunal pathology which could be either inflammatory or neoplastic. Depending on the clinical acuity, and if her symptoms resolve, this may require further follow-up. The best way to look at this by imaging would be to perform CT enterography in 3 or 4 weeks if the study could be delayed that long. Ordinary degenerative changes affect the spine. No significant bone pathology. The patient has had previous hysterectomy. IMPRESSION: No definite acute finding. I question if the patient could have jejunal pathology in the left upper quadrant. I do not think the findings are conclusive. This could be due to enteritis or even a jejunal mass. Further evaluation by CT enterography in 3 or 4 weeks could evaluate  this more clearly. Electronically Signed   By: Nelson Chimes M.D.   On: 11/27/2015 12:46   Dg Hip Unilat W Or W/o Pelvis 2-3 Views Left  11/11/2015  CLINICAL DATA:  Pain left groin. EXAM: DG HIP (WITH OR WITHOUT PELVIS) 2-3V LEFT COMPARISON:  None. FINDINGS: Degenerative changes lumbar spine and both hips. No acute bony or joint abnormality identified. No evidence of fracture dislocation. Pelvic calcifications are present consistent phleboliths. Pelvic clips and sutures are noted. IMPRESSION: No acute abnormality. Degenerative  changes lumbar spine and both hips. Electronically Signed   By: Marcello Moores  Register   On: 11/11/2015 15:26     EKG: (Independently reviewed) sinus rhythm with ventricular rate 95 bpm QTc 478 ms with chronic underlying left bundle branch block   Assessment & Plan  Principal Problem:   Regional enteritis of jejunum  -Medical floor -GI consulted; may need to undergo EGD versus enteroscopy this admission -Allow clears if can tolerate -IV fluids with potassium and setting of borderline hypokalemia -IV Levaquin -IV morphine for severe pain  Active Problems:   IBS/GERD -PPI on hold while receiving IV antibiotics     CKD (chronic kidney disease), stage III -Renal function stable and at baseline -Appears to be chronically azotemic and setting in thiazide diuretic use -Follow labs    Recurrent UTI  -Recent treatment with Cipro for recurrent enterococcus UTI -Patient had similar UTI in September and reports had been on low-dose antibiotics for prophylaxis (? macrodantin) which apparently was discontinued secondary to allergic reaction -Question translocation from GI source    History of pulmonary embolism/DVT -Continue Xarelto for now -Defer to GI if need to hold preprocedure    Essential hypertension, benign -Blood pressure currently well controlled -Given recurrent nausea and vomiting and dehydration we'll hold hydrochlorothiazide, Imdur and Lopressor as well as Diovan    Pulmonary hypertension /Chronic diastolic heart failure -Appears compensated from a heart failure standpoint -Resume afterload reduction when medically stable    Diabetes mellitus type 2, diet-controlled  -Last hemoglobin A1c was 6.1 in July -Follow CBGs and provide SSI as indicated    Anemia -Hemoglobin stable and at baseline of around 11.8    Polymyalgia rheumatica  -Not on chronic steroids    Hyperlipidemia -Not on statin prior to admission secondary to history of myalgias -Resume omega-3 fatty acids  once tolerating solid diet    CAD , native coronary artery -Had mildly elevated troponin of 0.04 in ER but is asymptomatic and therefore no indication to cycle cardiac enzymes -Beta blocker and nitrate on hold as above -Was not on aspirin prior to admission    DVT Prophylaxis: Xarelto  Family Communication:   Daughter and other family members at bedside  Code Status:  DO NOT RESUSCITATE  Condition:  Stable  Discharge disposition: Pending gastroenterology evaluation anticipate will discharge back to previous home environment; may benefit from PT/OT evaluation prior to discharge  Time spent in minutes : 60      ELLIS,ALLISON L. ANP on 11/27/2015 at 2:08 PM  Between 7am to 7pm - Pager - [phone]  After 7pm go to [email or web address] - password [email or web address]  And look for the night coverage person covering me after hours  [facility]

## 2015-11-27 NOTE — Consult Note (Signed)
Reason for Consult: ABM pain Referring Physician: [facility]  [name] HPI: This is a 81 year old female with a multiple medical problems admitted for persistent LLQ abdominal pain.  The patient was evaluated by her PCP on 11/11/2015 and the etiology of the pain was not clear.  She had some issues with urinary incontinence and she has a history of diverticular disease.  Further work up in the office was not evident for diverticulitis or an UTI, initially, but the cultures were positive for enterobacter.  It was sensitive to Cipro and she was treated for 7 days.  Unfortunately her left sided pain symptoms continued to persist.  In the ER she was identified to have an elevated WBC at 17,000, but no fever.  A CT scan revealed a possibility of an abnormality in the proximal jejunum, just past the ligament of Treitz.  No other abnormality to explain her symptoms.  Her last colonoscopy with Dr. Collene Mares was in 2005 with findings of a couple of proximal polyps and an healthy anastamosis for her diverticular disease.  The patient reports problems with nausea and vomiting all last evening, in fact this is the main issue.  There is a questionable history of diarrhea.  Past Medical History  Diagnosis Date  . Diabetes mellitus, type 2 ([place])   . Diverticulosis of colon   . GERD (gastroesophageal reflux disease)   . Hyperlipidemia   . Hypertension   . OA (osteoarthritis)   . Peripheral vascular disease ([place])   . PMR (polymyalgia rheumatica) (HCC)   . Frequent UTI   . Chest tightness     catheterization, 2005, normal coronaries  . Wide-complex tachycardia (H[facility])     SVT in past  . Hypokalemia   . LBBB (left bundle branch block)     Since at least 2005  . Ejection fraction < 50%     EF 40% echo, 09/2009  . Complication of anesthesia   . PONV (postoperative nausea and vomiting)   . PE (pulmonary embolism) 06/16/2014  . Shortness of breath   . Recurrent UTI     Past Surgical History  Procedure  Laterality Date  . Appendectomy  1993  . Cholecystectomy    . Colectomy  1993    partial  . Total abdominal hysterectomy  1960s    fibroids   . Vein ligation and stripping    . Bladder tack up    . Back surgery    . Esophagogastroduodenoscopy  05/2004    baretts esophagus, stomach polyps  . Bilateral pyelonephritis    . Cardiac catheterization  2005    no significant CAD  . Tonsillectomy    . Left heart catheterization with coronary angiogram N/A 06/12/2014    Procedure: LEFT HEART CATHETERIZATION WITH CORONARY ANGIOGRAM;  Surgeon: Peter M Martinique, MD;  Location[facility] CATH LAB;  Service: Cardiovascular;  Laterality: N/A;    Family History  Problem Relation Age of Onset  . CAD Mother   . Hypertension Mother   . Stroke Mother   . Pancreatic cancer Sister   . Diabetes Brother   . Heart Problems Brother   . Heart attack Mother   . Heart attack Brother     Social History:  reports that she has never smoked. She has never used smokeless tobacco. She reports that she does not drink alcohol or use illicit drugs.  Allergies:  Allergies  Allergen Reactions  . Nitrofurantoin Hives  . Tetracycline     REACTION: Rash  .  Erythromycin     REACTION: Rash  . Metformin     REACTION: vomiting and diarrhea  . Metronidazole     REACTION: whelps  . Nsaids     REACTION: GI symptoms  . Rofecoxib     REACTION: GI symptoms  . Simvastatin     REACTION: myalgia  . Sulfamethoxazole-Trimethoprim     REACTION: itching    Medications:  Scheduled: . insulin aspart  0-5 Units Subcutaneous QHS  . insulin aspart  0-9 Units Subcutaneous TID WC   Continuous: . sodium chloride 75 mL/hr at 11/27/15 1511  . 0.9 % NaCl with KCl 20 mEq / L 100 mL/hr at 11/27/15 1358    Results for orders placed or performed during the hospital encounter of 11/27/15 (from the past 24 hour(s))  Comprehensive metabolic panel     Status: Abnormal   Collection Time: 11/27/15 10:48 AM  Result Value Ref Range    Sodium 137 135 - 145 mmol/L   Potassium 3.6 3.5 - 5.1 mmol/L   Chloride 101 101 - 111 mmol/L   CO2 24 22 - 32 mmol/L   Glucose, Bld 140 (H) 65 - 99 mg/dL   BUN 23 (H) 6 - 20 mg/dL   Creatinine, Ser 1.28 (H) 0.44 - 1.00 mg/dL   Calcium 9.2 8.9 - 10.3 mg/dL   Total Protein 6.7 6.5 - 8.1 g/dL   Albumin 3.3 (L) 3.5 - 5.0 g/dL   AST 28 15 - 41 U/L   ALT 21 14 - 54 U/L   Alkaline Phosphatase 54 38 - 126 U/L   Total Bilirubin 1.3 (H) 0.3 - 1.2 mg/dL   GFR calc non Af Amer 35 (L) >60 mL/min   GFR calc Af Amer 40 (L) >60 mL/min   Anion gap 12 5 - 15  Lipase, blood     Status: None   Collection Time: 11/27/15 10:48 AM  Result Value Ref Range   Lipase 30 11 - 51 U/L  Troponin I     Status: Abnormal   Collection Time: 11/27/15 10:48 AM  Result Value Ref Range   Troponin I 0.04 (H) <0.031 ng/mL  CBC with Differential     Status: Abnormal   Collection Time: 11/27/15 10:48 AM  Result Value Ref Range   WBC 17.6 (H) 4.0 - 10.5 K/uL   RBC 3.89 3.87 - 5.11 MIL/uL   Hemoglobin 11.8 (L) 12.0 - 15.0 g/dL   HCT 35.4 (L) 36.0 - 46.0 %   MCV 91.0 78.0 - 100.0 fL   MCH 30.3 26.0 - 34.0 pg   MCHC 33.3 30.0 - 36.0 g/dL   RDW 13.9 11.5 - 15.5 %   Platelets 288 150 - 400 K/uL   Neutrophils Relative % 85 %   Neutro Abs 15.1 (H) 1.7 - 7.7 K/uL   Lymphocytes Relative 8 %   Lymphs Abs 1.3 0.7 - 4.0 K/uL   Monocytes Relative 7 %   Monocytes Absolute 1.2 (H) 0.1 - 1.0 K/uL   Eosinophils Relative 0 %   Eosinophils Absolute 0.0 0.0 - 0.7 K/uL   Basophils Relative 0 %   Basophils Absolute 0.0 0.0 - 0.1 K/uL  Urinalysis, Routine w reflex microscopic     Status: Abnormal   Collection Time: 11/27/15 11:12 AM  Result Value Ref Range   Color, Urine YELLOW YELLOW   APPearance CLEAR CLEAR   Specific Gravity, Urine 1.016 1.005 - 1.030   pH 5.0 5.0 - 8.0   Glucose, UA NEGATIVE NEGATIVE  mg/dL   Hgb urine dipstick NEGATIVE NEGATIVE   Bilirubin Urine NEGATIVE NEGATIVE   Ketones, ur 15 (A) NEGATIVE mg/dL    Protein, ur NEGATIVE NEGATIVE mg/dL   Nitrite NEGATIVE NEGATIVE   Leukocytes, UA NEGATIVE NEGATIVE     Ct Abdomen Pelvis W Contrast  11/27/2015  CLINICAL DATA:  Nausea and vomiting since yesterday. History of previous cholecystectomy. EXAM: CT ABDOMEN AND PELVIS WITH CONTRAST TECHNIQUE: Multidetector CT imaging of the abdomen and pelvis was performed using the standard protocol following bolus administration of intravenous contrast. CONTRAST:  24mL OMNIPAQUE IOHEXOL 300 MG/ML  SOLN COMPARISON:  10/12/2009 an multiple previous FINDINGS: There is mild scarring and pleural thickening at the left base. No pericardial fluid. The liver has normal appearance. Previous cholecystectomy. The spleen is normal. The pancreas is normal. The adrenal glands are normal. The kidneys are normal, with multiple parapelvic cysts bilaterally. There may be mild renal atrophy on the left. The aorta shows atherosclerosis but no aneurysm. The IVC is normal. No retroperitoneal mass or adenopathy. No free intraperitoneal fluid or air. No evidence of obstruction. Have some concern about the appearance of the jejunum in the region just past the ligament of Treitz. The bowel appears questionably dilated and the contents appear somewhat unusual for the proximal small bowel. I question some stranding in the mesentery and the presence of some small regional lymph nodes. This raises the possibility of jejunal pathology which could be either inflammatory or neoplastic. Depending on the clinical acuity, and if her symptoms resolve, this may require further follow-up. The best way to look at this by imaging would be to perform CT enterography in 3 or 4 weeks if the study could be delayed that long. Ordinary degenerative changes affect the spine. No significant bone pathology. The patient has had previous hysterectomy. IMPRESSION: No definite acute finding. I question if the patient could have jejunal pathology in the left upper quadrant. I do not  think the findings are conclusive. This could be due to enteritis or even a jejunal mass. Further evaluation by CT enterography in 3 or 4 weeks could evaluate this more clearly. Electronically Signed   By: Nelson Chimes M.D.   On: 11/27/2015 12:46    ROS:  As stated above in the HPI otherwise negative.  Blood pressure 138/56, pulse 105, temperature 98.8 F (37.1 C), temperature source Oral, resp. rate 14, SpO2 96 %.    PE: Gen: NAD, Alert and Oriented HEENT:  [place]/AT, EOMI Neck: Supple, no LAD Lungs: CTA Bilaterally CV: RRR without M/G/R ABM: Soft, mild LLQ pain, +BS Ext: No C/C/E  Assessment/Plan: 1) Nausea/vomiting. 2) Abnormal CT scan - ? Proximal jejunal pathology. 3) LLQ pain of ? Etiology. 4) ? Diarrhea. 5) Leukocytosis.   The history is difficult to obtain.  Initially there was a focus for her LLQ pain, but it turns out that she is complaining more about her nausea/vomiting.  The CT scan was negative for any abnormalities in LLQ region. She has a history of diverticular disease and she is s/p resection.  There is a finding of a proximal jejunal abnormality.  Right now I think further evaluation of this area with her nausea and vomiting complains is reasonable. She also has an elevated WBC count.  Plan: 1) Enteroscopy.  Dymphna Wadley D 11/27/2015, 2:08 PM

## 2015-11-27 NOTE — Progress Notes (Signed)
ANTICOAGULATION CONSULT NOTE - Initial Consult  Pharmacy Consult for lovenox Indication: pulmonary embolus  Allergies  Allergen Reactions  . Nitrofurantoin Hives  . Tetracycline     REACTION: Rash  . Erythromycin     REACTION: Rash  . Metformin     REACTION: vomiting and diarrhea  . Metronidazole     REACTION: whelps  . Nsaids     REACTION: GI symptoms  . Rofecoxib     REACTION: GI symptoms  . Simvastatin     REACTION: myalgia  . Sulfamethoxazole-Trimethoprim     REACTION: itching    Patient Measurements: Weight: 150 lb 12.7 oz (68.4 kg) Heparin Dosing Weight:   Vital Signs: Temp: 98.3 F (36.8 C) (12/09 1705) Temp Source: Oral (12/09 1705) BP: 130/51 mmHg (12/09 1705) Pulse Rate: 96 (12/09 1705)  Labs:  Recent Labs  11/27/15 1048  HGB 11.8*  HCT 35.4*  PLT 288  CREATININE 1.28*  TROPONINI 0.04*    Estimated Creatinine Clearance: 23.7 mL/min (by C-G formula based on Cr of 1.28).   Medical History: Past Medical History  Diagnosis Date  . Diabetes mellitus, type 2 ([place])   . Diverticulosis of colon   . GERD (gastroesophageal reflux disease)   . Hyperlipidemia   . Hypertension   . OA (osteoarthritis)   . Peripheral vascular disease ([place])   . PMR (polymyalgia rheumatica) (H[facility])   . Frequent UTI   . Chest tightness     catheterization, 2005, normal coronaries  . Wide-complex tachycardia ([facility])     SVT in past  . Hypokalemia   . LBBB (left bundle branch block)     Since at least 2005  . Ejection fraction < 50%     EF 40% echo, 09/2009  . Complication of anesthesia   . PONV (postoperative nausea and vomiting)   . PE (pulmonary embolism) 06/16/2014  . Shortness of breath   . Recurrent UTI   . Enteritis 11/2015    Medications:  Scheduled:  . sodium chloride   Intravenous STAT  . enoxaparin (LOVENOX) injection  1 mg/kg Subcutaneous Q24H  . insulin aspart  0-5 Units Subcutaneous QHS  . insulin aspart  0-9 Units Subcutaneous TID WC  .  levofloxacin (LEVAQUIN) IV  250 mg Intravenous Once   Followed by  . [START ON 11/29/2015] levofloxacin (LEVAQUIN) IV  750 mg Intravenous Q48H  . metoprolol tartrate  25 mg Oral BID   Infusions:  . sodium chloride 75 mL/hr at 11/27/15 1511    Assessment: 81 yo who presented with enteritis. Pt has a hx of PE that was put on Xarelto 15mg  PO qday at home. Due to her age and scr 1.28 (CrCl = 47ml/min), we are holding it for now and use lovenox instead. Even if scr improves slightly, I'm not sure if CrCl will be >30 due to her age. Last dose 12/8 PM.  Goal of Therapy:  Anti-Xa = 0.6-1 Monitor platelets by anticoagulation protocol: Yes   Plan:   Lovenox 70mg  SQ q24 CBC q3days  Onnie Boer, PharmD Pager: [phone] 11/27/2015 5:24 PM

## 2015-11-27 NOTE — Progress Notes (Signed)
NURSING PROGRESS NOTE  Ellen KLEINBERG MD:[id number] Admission Data: 11/27/2015 7:55 PM Attending Provider: Elmarie Shiley, MD [id number] [place], MD Code Status: DNR  Ellen Peterson is a 81 y.o. female patient admitted from ED:  -No acute distress noted.  -No complaints of shortness of breath.  -No complaints of chest pain.   Cardiac Monitoring: N/A  Blood pressure 130/51, pulse 96, temperature 98.3 F (36.8 C), temperature source Oral, resp. rate 16, weight 68.4 kg (150 lb 12.7 oz), SpO2 98 %.   IV Fluids:  IV in place, occlusive dsg intact without redness, IV cath antecubital left, condition patent and no redness normal saline.   Allergies:  Metronidazole; Nitrofurantoin; Nsaids; Rofecoxib; Simvastatin; Erythromycin; Metformin; Sulfamethoxazole-trimethoprim; and Tetracycline  Past Medical History:   has a past medical history of Diabetes mellitus, type 2 ([place]); Diverticulosis of colon; GERD (gastroesophageal reflux disease); Hyperlipidemia; Hypertension; OA (osteoarthritis); Peripheral vascular disease ([place]); PMR (polymyalgia rheumatica) ([place]); Frequent UTI; Chest tightness; Wide-complex tachycardia ([place]); Hypokalemia; LBBB (left bundle branch block); Ejection fraction < 50%; Complication of anesthesia; PONV (postoperative nausea and vomiting); PE (pulmonary embolism) (06/16/2014); Shortness of breath; Recurrent UTI; and Enteritis (11/2015).  Past Surgical History:   has past surgical history that includes Appendectomy (1993); Cholecystectomy; Colectomy (1993); Total abdominal hysterectomy (1960s); Vein ligation and stripping; bladder tack up; Back surgery; Esophagogastroduodenoscopy (05/2004); bilateral pyelonephritis; Cardiac catheterization (2005); Tonsillectomy; and left heart catheterization with coronary angiogram (N/A, 06/12/2014).  Social History:   reports that she has never smoked. She has never used smokeless tobacco. She reports that she does not drink alcohol or use illicit  drugs.  Skin: intact  Patient/Family orientated to room. Information packet given to patient/family. Admission inpatient armband information verified with patient/family to include name and date of birth and placed on patient arm. Side rails up x 2, fall assessment and education completed with patient/family. Patient/family able to verbalize understanding of risk associated with falls and verbalized understanding to call for assistance before getting out of bed. Call light within reach. Patient/family able to voice and demonstrate understanding of unit orientation instructions.    Will continue to evaluate and treat per MD orders.

## 2015-11-27 NOTE — ED Notes (Signed)
Pt presents from home via [facility] with c/o low abdominal pain, nausea, vomiting, and stool incontinence x5 days.  She is A&Ox4. Bowel sounds present, tenderness noted to RLQ on palpation.

## 2015-11-27 NOTE — ED Provider Notes (Signed)
CSN: [id number]     Arrival date & time 11/27/15  0944 History   First MD Initiated Contact with Patient 11/27/15 1011     Chief Complaint  Patient presents with  . Abdominal Pain     (Consider location/radiation/quality/duration/timing/severity/associated sxs/prior Treatment) HPI The patient has had left lower quadrant pain for about 5 days. She reports she has had diverticulitis in the past. She also notes she is having difficulty with bowel movements. She reports her last normal bowel movement was about 4 days ago. She has not seen any blood or black tarry stool. Yesterday she developed vomiting. Her daughter reports she vomited multiple times overnight. They have not had documented fever. She also reports she was recently diagnosed with a urinary tract infection and has been on antibiotics. Past Medical History  Diagnosis Date  . Diabetes mellitus, type 2 ([place])   . Diverticulosis of colon   . GERD (gastroesophageal reflux disease)   . Hyperlipidemia   . Hypertension   . OA (osteoarthritis)   . Peripheral vascular disease ([place])   . PMR (polymyalgia rheumatica) (HCC)   . Frequent UTI   . Chest tightness     catheterization, 2005, normal coronaries  . Wide-complex tachycardia (H[facility])     SVT in past  . Hypokalemia   . LBBB (left bundle branch block)     Since at least 2005  . Ejection fraction < 50%     EF 40% echo, 09/2009  . Complication of anesthesia   . PONV (postoperative nausea and vomiting)   . PE (pulmonary embolism) 06/16/2014  . Shortness of breath   . Recurrent UTI    Past Surgical History  Procedure Laterality Date  . Appendectomy  1993  . Cholecystectomy    . Colectomy  1993    partial  . Total abdominal hysterectomy  1960s    fibroids   . Vein ligation and stripping    . Bladder tack up    . Back surgery    . Esophagogastroduodenoscopy  05/2004    baretts esophagus, stomach polyps  . Bilateral pyelonephritis    . Cardiac catheterization  2005    no  significant CAD  . Tonsillectomy    . Left heart catheterization with coronary angiogram N/A 06/12/2014    Procedure: LEFT HEART CATHETERIZATION WITH CORONARY ANGIOGRAM;  Surgeon: Peter M Martinique, MD;  Location: [facility] CATH LAB;  Service: Cardiovascular;  Laterality: N/A;   Family History  Problem Relation Age of Onset  . CAD Mother   . Hypertension Mother   . Stroke Mother   . Pancreatic cancer Sister   . Diabetes Brother   . Heart Problems Brother   . Heart attack Mother   . Heart attack Brother    Social History  Substance Use Topics  . Smoking status: Never Smoker   . Smokeless tobacco: Never Used  . Alcohol Use: No   OB History    No data available     Review of Systems  10 Systems reviewed and are negative for acute change except as noted in the HPI.   Allergies  Nitrofurantoin; Tetracycline; Erythromycin; Metformin; Metronidazole; Nsaids; Rofecoxib; Simvastatin; and Sulfamethoxazole-trimethoprim  Home Medications   Prior to Admission medications   Medication Sig Start Date End Date Taking? Authorizing Provider  acetaminophen (TYLENOL) 500 MG tablet Take 500 mg by mouth every 6 (six) hours as needed (pain).     Historical Provider, MD  cholecalciferol (VITAMIN D) 1000 UNITS tablet Take 1,000 Units by mouth  daily.      Historical Provider, MD  CINNAMON PO Take 1,000 mg by mouth daily.    Historical Provider, MD  ciprofloxacin (CIPRO) 250 MG tablet Take 1 tablet (250 mg total) by mouth 2 (two) times daily. 11/11/15   Abner Greenspan, MD  diclofenac sodium (VOLTAREN) 1 % GEL Apply 2 g topically 2 (two) times daily as needed (knee pain). 01/06/15   Abner Greenspan, MD  hydrochlorothiazide (HYDRODIURIL) 25 MG tablet TAKE 1 TABLET BY MOUTH DAILY 06/04/15   Carlena Bjornstad, MD  isosorbide mononitrate (IMDUR) 30 MG 24 hr tablet TAKE 1 TABLET BY MOUTH DAILY 06/04/15   Carlena Bjornstad, MD  metoprolol tartrate (LOPRESSOR) 25 MG tablet TAKE 1 TABLET BY MOUTH TWICE A DAY 06/04/15   Carlena Bjornstad, MD  Multiple Vitamins-Minerals (ICAPS PO) Take 1 capsule by mouth daily.    Historical Provider, MD  nitroGLYCERIN (NITROSTAT) 0.4 MG SL tablet Place 1 tablet (0.4 mg total) under the tongue every 5 (five) minutes as needed for chest pain. As directed and as needed. 07/11/14   Abner Greenspan, MD  Omega-3 Fatty Acids (FISH OIL) 1000 MG CAPS Take 1,000 mg by mouth daily.    Historical Provider, MD  pantoprazole (PROTONIX) 40 MG tablet Take 1 tablet (40 mg total) by mouth 2 (two) times daily. 01/06/15   Abner Greenspan, MD  rivaroxaban (XARELTO) 15 MG TABS tablet Take 1 tablet (15 mg total) by mouth daily with supper. Starting 07/08/14 04/08/15   Carlena Bjornstad, MD  traMADol (ULTRAM) 50 MG tablet TAKE 1 TABLET EVERY EIGHT HOURS AS NEEDED FOR PAIN 07/01/15   Abner Greenspan, MD  valsartan (DIOVAN) 80 MG tablet Take 1 tablet (80 mg total) by mouth daily. 09/29/15   Carlena Bjornstad, MD  vitamin E 400 UNIT capsule Take 400 Units by mouth daily.      Historical Provider, MD   BP 143/61 mmHg  Pulse 101  Temp(Src) 98.8 F (37.1 C) (Oral)  Resp 17  SpO2 96% Physical Exam  Constitutional: She is oriented to person, place, and time. She appears well-developed and well-nourished.  HENT:  Head: Normocephalic and atraumatic.  Eyes: EOM are normal. Pupils are equal, round, and reactive to light.  Neck: Neck supple.  Cardiovascular: Normal rate, regular rhythm, normal heart sounds and intact distal pulses.   Pulmonary/Chest: Effort normal and breath sounds normal.  Abdominal: Soft. Bowel sounds are normal. She exhibits no distension. There is tenderness.  Left lower quadrant tenderness to palpation. No Guarding. No mass or fullness in the groin.  Genitourinary:  Rectal examination: No stool impaction. Soft yellow-brown stool in the vault.  Musculoskeletal: Normal range of motion. She exhibits edema. She exhibits no tenderness.  Trace edema and bruising of the lower legs. No cellulitis.  Neurological: She is  alert and oriented to person, place, and time. She has normal strength. Coordination normal. GCS eye subscore is 4. GCS verbal subscore is 5. GCS motor subscore is 6.  Skin: Skin is warm, dry and intact.  Psychiatric: She has a normal mood and affect.    ED Course  Procedures (including critical care time) Labs Review Labs Reviewed  COMPREHENSIVE METABOLIC PANEL - Abnormal; Notable for the following:    Glucose, Bld 140 (*)    BUN 23 (*)    Creatinine, Ser 1.28 (*)    Albumin 3.3 (*)    Total Bilirubin 1.3 (*)    GFR calc non Af Wyvonnia Lora  35 (*)    GFR calc Af Amer 40 (*)    All other components within normal limits  TROPONIN I - Abnormal; Notable for the following:    Troponin I 0.04 (*)    All other components within normal limits  CBC WITH DIFFERENTIAL/PLATELET - Abnormal; Notable for the following:    WBC 17.6 (*)    Hemoglobin 11.8 (*)    HCT 35.4 (*)    Neutro Abs 15.1 (*)    Monocytes Absolute 1.2 (*)    All other components within normal limits  URINALYSIS, ROUTINE W REFLEX MICROSCOPIC (NOT AT [facility]) - Abnormal; Notable for the following:    Ketones, ur 15 (*)    All other components within normal limits  C DIFFICILE QUICK SCREEN W PCR REFLEX  LIPASE, BLOOD  POC OCCULT BLOOD, ED    Imaging Review Ct Abdomen Pelvis W Contrast  11/27/2015  CLINICAL DATA:  Nausea and vomiting since yesterday. History of previous cholecystectomy. EXAM: CT ABDOMEN AND PELVIS WITH CONTRAST TECHNIQUE: Multidetector CT imaging of the abdomen and pelvis was performed using the standard protocol following bolus administration of intravenous contrast. CONTRAST:  69mL OMNIPAQUE IOHEXOL 300 MG/ML  SOLN COMPARISON:  10/12/2009 an multiple previous FINDINGS: There is mild scarring and pleural thickening at the left base. No pericardial fluid. The liver has normal appearance. Previous cholecystectomy. The spleen is normal. The pancreas is normal. The adrenal glands are normal. The kidneys are normal, with  multiple parapelvic cysts bilaterally. There may be mild renal atrophy on the left. The aorta shows atherosclerosis but no aneurysm. The IVC is normal. No retroperitoneal mass or adenopathy. No free intraperitoneal fluid or air. No evidence of obstruction. Have some concern about the appearance of the jejunum in the region just past the ligament of Treitz. The bowel appears questionably dilated and the contents appear somewhat unusual for the proximal small bowel. I question some stranding in the mesentery and the presence of some small regional lymph nodes. This raises the possibility of jejunal pathology which could be either inflammatory or neoplastic. Depending on the clinical acuity, and if her symptoms resolve, this may require further follow-up. The best way to look at this by imaging would be to perform CT enterography in 3 or 4 weeks if the study could be delayed that long. Ordinary degenerative changes affect the spine. No significant bone pathology. The patient has had previous hysterectomy. IMPRESSION: No definite acute finding. I question if the patient could have jejunal pathology in the left upper quadrant. I do not think the findings are conclusive. This could be due to enteritis or even a jejunal mass. Further evaluation by CT enterography in 3 or 4 weeks could evaluate this more clearly. Electronically Signed   By: Nelson Chimes M.D.   On: 11/27/2015 12:46   I have personally reviewed and evaluated these images and lab results as part of my medical decision-making.   EKG Interpretation None      MDM   Final diagnoses:  Enteritis  Leukocytosis  Left lower quadrant pain   Patient has leukocytosis. She has equivocal findings of inflammation the jejunum. She has had left lateral abdominal pain. At this point time patient will be admitted for fluids with onset of vomiting yesterday. At this time she is alert and appropriate. With the patient's age and associated findings I feel that  inpatient treatment is appropriate and she'll be initiated on Levaquin for enteritis with leukocytosis.    Charlesetta Shanks, MD 11/27/15 1314

## 2015-11-28 DIAGNOSIS — K50019 Crohn's disease of small intestine with unspecified complications: Secondary | ICD-10-CM

## 2015-11-28 DIAGNOSIS — D649 Anemia, unspecified: Secondary | ICD-10-CM

## 2015-11-28 DIAGNOSIS — R933 Abnormal findings on diagnostic imaging of other parts of digestive tract: Secondary | ICD-10-CM

## 2015-11-28 DIAGNOSIS — R112 Nausea with vomiting, unspecified: Secondary | ICD-10-CM

## 2015-11-28 LAB — BASIC METABOLIC PANEL
ANION GAP: 9 (ref 5–15)
BUN: 22 mg/dL — ABNORMAL HIGH (ref 6–20)
CHLORIDE: 108 mmol/L (ref 101–111)
CO2: 23 mmol/L (ref 22–32)
CREATININE: 1.21 mg/dL — AB (ref 0.44–1.00)
Calcium: 8.5 mg/dL — ABNORMAL LOW (ref 8.9–10.3)
GFR calc non Af Amer: 37 mL/min — ABNORMAL LOW (ref >60)
GFR, EST AFRICAN AMERICAN: 43 mL/min — AB (ref >60)
Glucose, Bld: 94 mg/dL (ref 65–99)
POTASSIUM: 3.7 mmol/L (ref 3.5–5.1)
SODIUM: 140 mmol/L (ref 135–145)

## 2015-11-28 LAB — CBC
HCT: 29.3 % — ABNORMAL LOW (ref 36.0–46.0)
HEMOGLOBIN: 9.8 g/dL — AB (ref 12.0–15.0)
MCH: 30.6 pg (ref 26.0–34.0)
MCHC: 33.4 g/dL (ref 30.0–36.0)
MCV: 91.6 fL (ref 78.0–100.0)
PLATELETS: 247 10*3/uL (ref 150–400)
RBC: 3.2 MIL/uL — AB (ref 3.87–5.11)
RDW: 14 % (ref 11.5–15.5)
WBC: 9.9 10*3/uL (ref 4.0–10.5)

## 2015-11-28 LAB — GLUCOSE, CAPILLARY
GLUCOSE-CAPILLARY: 85 mg/dL (ref 65–99)
Glucose-Capillary: 80 mg/dL (ref 65–99)
Glucose-Capillary: 91 mg/dL (ref 65–99)

## 2015-11-28 MED ORDER — LEVOFLOXACIN IN D5W 750 MG/150ML IV SOLN: 750.0000 mg | INTRAVENOUS | Status: DC

## 2015-11-28 MED ORDER — LEVOFLOXACIN IN D5W 500 MG/100ML IV SOLN
500.0000 mg | INTRAVENOUS | Status: DC
  Administered 2015-11-29: 500 mg via INTRAVENOUS
  Filled 2015-11-28 (×2): qty 100

## 2015-11-28 NOTE — Progress Notes (Signed)
[facility] PROGRESS NOTE  Ellen Peterson [id number] DOB: March 06, 1920 DOA: 11/27/2015 PCP: [name], MD  Assessment/Plan: 1. 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  - gi consulted and recommendations given. Plan for enteroscopy in am.    2. Acute on CKD stage 2.: Improving with fluids.     3. Anemia, normocytic.  Baseline around 11,  Currently 9.8, monitor.  Get anemia panel.    Code Status: DNR Family Communication: none at bedside. Disposition Plan: pending further investigation.    Consultants: gastroenterology Procedures:  enteroscopy scheduled for tomorrow  Antibiotics:  levaquin.  Nutrition: Clear liquid diet.   HPI/Subjective: She denies any abdominal pain or nausea,.   Objective: Filed Vitals:   11/28/15 0802 11/28/15 1639  BP: 145/50 148/61  Pulse: 69 67  Temp: 97.7 F (36.5 C) 98.8 F (37.1 C)  Resp: 18 18    Intake/Output Summary (Last 24 hours) at 11/28/15 1735 Last data filed at 11/28/15 1400  Gross per 24 hour  Intake    770 ml  Output      0 ml  Net    770 ml   Filed Weights   11/27/15 1600 11/27/15 2006  Weight: 68.4 kg (150 lb 12.7 oz) 68.2 kg (150 lb 5.7 oz)    Exam:   General:  Alert afebrile comfortable.   Cardiovascular: s1s2  Respiratory: ctab  Abdomen: soft non tender non distended bowel sounds heard.   Musculoskeletal: no pedal edema.   Data Reviewed: Basic Metabolic Panel:  Recent Labs Lab 11/27/15 1048 11/28/15 0552  NA 137 140  K 3.6 3.7  CL 101 108  CO2 24 23  GLUCOSE 140* 94  BUN 23* 22*  CREATININE 1.28* 1.21*  CALCIUM 9.2 8.5*   Liver Function Tests:  Recent Labs Lab 11/27/15 1048  AST 28  ALT 21  ALKPHOS 54  BILITOT 1.3*  PROT 6.7  ALBUMIN 3.3*    Recent Labs Lab 11/27/15 1048  LIPASE 30   No results for input(s): AMMONIA in the last 168  hours. CBC:  Recent Labs Lab 11/27/15 1048 11/28/15 0552  WBC 17.6* 9.9  NEUTROABS 15.1*  --   HGB 11.8* 9.8*  HCT 35.4* 29.3*  MCV 91.0 91.6  PLT 288 247   Cardiac Enzymes:  Recent Labs Lab 11/27/15 1048  TROPONINI 0.04*   BNP (last 3 results) No results for input(s): BNP in the last 8760 hours.  ProBNP (last 3 results) No results for input(s): PROBNP in the last 8760 hours.  CBG:  Recent Labs Lab 11/27/15 1718 11/27/15 2005 11/28/15 0759 11/28/15 1641  GLUCAP 114* 111* 80 91    No results found for this or any previous visit (from the past 240 hour(s)).   Studies: Ct Abdomen Pelvis W Contrast  11/27/2015  CLINICAL DATA:  Nausea and vomiting since yesterday. History of previous cholecystectomy. EXAM: CT ABDOMEN AND PELVIS WITH CONTRAST TECHNIQUE: Multidetector CT imaging of the abdomen and pelvis was performed using the standard protocol following bolus administration of intravenous contrast. CONTRAST:  3mL OMNIPAQUE IOHEXOL 300 MG/ML  SOLN COMPARISON:  10/12/2009 an multiple previous FINDINGS: There is mild scarring and pleural thickening at the left base. No pericardial fluid. The liver has normal appearance. Previous cholecystectomy. The spleen is normal. The pancreas is normal. The adrenal glands are normal. The kidneys are normal, with multiple parapelvic cysts bilaterally. There may be mild renal atrophy on  the left. The aorta shows atherosclerosis but no aneurysm. The IVC is normal. No retroperitoneal mass or adenopathy. No free intraperitoneal fluid or air. No evidence of obstruction. Have some concern about the appearance of the jejunum in the region just past the ligament of Treitz. The bowel appears questionably dilated and the contents appear somewhat unusual for the proximal small bowel. I question some stranding in the mesentery and the presence of some small regional lymph nodes. This raises the possibility of jejunal pathology which could be either  inflammatory or neoplastic. Depending on the clinical acuity, and if her symptoms resolve, this may require further follow-up. The best way to look at this by imaging would be to perform CT enterography in 3 or 4 weeks if the study could be delayed that long. Ordinary degenerative changes affect the spine. No significant bone pathology. The patient has had previous hysterectomy. IMPRESSION: No definite acute finding. I question if the patient could have jejunal pathology in the left upper quadrant. I do not think the findings are conclusive. This could be due to enteritis or even a jejunal mass. Further evaluation by CT enterography in 3 or 4 weeks could evaluate this more clearly. Electronically Signed   By: Nelson Chimes M.D.   On: 11/27/2015 12:46    Scheduled Meds: . insulin aspart  0-5 Units Subcutaneous QHS  . insulin aspart  0-9 Units Subcutaneous TID WC  . metoprolol tartrate  25 mg Oral BID   Continuous Infusions: . sodium chloride 75 mL/hr at 11/28/15 1015    Principal Problem:   Regional enteritis of jejunum ([place]) Active Problems:   Anemia   IBS   Recurrent UTI   Polymyalgia rheumatica (HCC)   Hyperlipidemia   History of pulmonary embolism   Essential hypertension, benign   GERD (gastroesophageal reflux disease)   History of DVT (deep vein thrombosis)   CAD (coronary artery disease), native coronary artery   Pulmonary hypertension (HCC)   CKD (chronic kidney disease), stage III   Chronic diastolic heart failure (HCC)   Diabetes mellitus type 2, diet-controlled ([place])    Time spent: 25 minutes.     [facility] Pager [phone] If 7PM-7AM, please contact night-coverage at [email or web address], password [facility] 11/28/2015, 5:35 PM  LOS: 1 day

## 2015-11-28 NOTE — Progress Notes (Signed)
GI Rounding Note Covering for Drs. Mann/Hung  11/28/2015, 8:18 AM  LOS: 1 day   SUBJECTIVE:       Not much if any abd pain.  Just feels like she needs to pass stool and all will be better.  Last large stool was on 12/5.  Tolerating clears but not hungry. No n/v  OBJECTIVE:         Vital signs in last 24 hours:    Temp:  [97.7 F (36.5 C)-98.8 F (37.1 C)] 97.7 F (36.5 C) (12/10 0802) Pulse Rate:  [66-109] 69 (12/10 0802) Resp:  [14-20] 18 (12/10 0802) BP: (106-147)/(44-97) 145/50 mmHg (12/10 0802) SpO2:  [95 %-100 %] 99 % (12/10 0802) Weight:  [68.2 kg (150 lb 5.7 oz)-68.4 kg (150 lb 12.7 oz)] 68.2 kg (150 lb 5.7 oz) (12/09 2006) Last BM Date: 11/26/15 Filed Weights   11/27/15 1600 11/27/15 2006  Weight: 68.4 kg (150 lb 12.7 oz) 68.2 kg (150 lb 5.7 oz)   General: comfortable, anxious.  Not toxic appearing    Heart: RRR Chest: clear bil.  No dyspnea Abdomen: soft, NT, ND.  BS active.   Extremities: no CCE Neuro/Psych:  Oriented x 3.  No limb weakness or tremor.  Somewhat anxious.   Intake/Output from previous day: 12/09 0701 - 12/10 0700 In: 546.3 [P.O.:360; I.V.:136.3; IV Piggyback:50] Out: 0   Intake/Output this shift:    Lab Results:  Recent Labs  11/27/15 1048 11/28/15 0552  WBC 17.6* 9.9  HGB 11.8* 9.8*  HCT 35.4* 29.3*  PLT 288 247   BMET  Recent Labs  11/27/15 1048 11/28/15 0552  NA 137 140  K 3.6 3.7  CL 101 108  CO2 24 23  GLUCOSE 140* 94  BUN 23* 22*  CREATININE 1.28* 1.21*  CALCIUM 9.2 8.5*   LFT  Recent Labs  11/27/15 1048  PROT 6.7  ALBUMIN 3.3*  AST 28  ALT 21  ALKPHOS 54  BILITOT 1.3*   PT/INR No results for input(s): LABPROT, INR in the last 72 hours. Hepatitis Panel No results for input(s): HEPBSAG, HCVAB, HEPAIGM, HEPBIGM in the last 72 hours.  Studies/Results: Ct Abdomen Pelvis W Contrast  11/27/2015  CLINICAL DATA:  Nausea and vomiting since yesterday. History  of previous cholecystectomy. EXAM: CT ABDOMEN AND PELVIS WITH CONTRAST TECHNIQUE: Multidetector CT imaging of the abdomen and pelvis was performed using the standard protocol following bolus administration of intravenous contrast. CONTRAST:  30mL OMNIPAQUE IOHEXOL 300 MG/ML  SOLN COMPARISON:  10/12/2009 an multiple previous FINDINGS: There is mild scarring and pleural thickening at the left base. No pericardial fluid. The liver has normal appearance. Previous cholecystectomy. The spleen is normal. The pancreas is normal. The adrenal glands are normal. The kidneys are normal, with multiple parapelvic cysts bilaterally. There may be mild renal atrophy on the left. The aorta shows atherosclerosis but no aneurysm. The IVC is normal. No retroperitoneal mass or adenopathy. No free intraperitoneal fluid or air. No evidence of obstruction. Have some concern about the appearance of the jejunum in the region just past the ligament of Treitz. The bowel appears questionably dilated and the contents appear somewhat unusual for the proximal small bowel. I question some stranding in the mesentery and the presence of some small regional lymph nodes. This raises the possibility of jejunal pathology which could be either inflammatory or neoplastic. Depending on the clinical acuity, and if her symptoms resolve, this may require further follow-up. The best way to look at this by imaging would  be to perform CT enterography in 3 or 4 weeks if the study could be delayed that long. Ordinary degenerative changes affect the spine. No significant bone pathology. The patient has had previous hysterectomy. IMPRESSION: No definite acute finding. I question if the patient could have jejunal pathology in the left upper quadrant. I do not think the findings are conclusive. This could be due to enteritis or even a jejunal mass. Further evaluation by CT enterography in 3 or 4 weeks could evaluate this more clearly. Electronically Signed   By: Nelson Chimes M.D.   On: 11/27/2015 12:46   Scheduled Meds: . sodium chloride   Intravenous STAT  . enoxaparin (LOVENOX) injection  1 mg/kg Subcutaneous Q24H  . insulin aspart  0-5 Units Subcutaneous QHS  . insulin aspart  0-9 Units Subcutaneous TID WC  . [START ON 11/29/2015] levofloxacin (LEVAQUIN) IV  750 mg Intravenous Q48H  . metoprolol tartrate  25 mg Oral BID   Continuous Infusions: . sodium chloride Stopped (11/27/15 1831)   PRN Meds:.acetaminophen **OR** acetaminophen, morphine injection, ondansetron **OR** ondansetron (ZOFRAN) IV  ASSESMENT:   *  N/V/abd pain.  CT with jejunal enteritis vs mass.   U/A negative.   *  Chronic Xarelto for hx DVT and PE    PLAN   *  Enterscopy tomorrow, after 2 day Xarelto washout.   *  Note contact precautions in pt who is not having diarrhea, last decent BM was 5 days ago.  I discontinued stool pathogen panel order.  Still on contact precautions for ? Norvirus.   *  Stop Lovenox, next dose due at 2000 today, to close to EGD time in AM.     Azucena Freed  11/28/2015, 8:18 AM Pager: [phone]     Attending physician's note   I have taken an interval history, reviewed the chart and examined the patient. I agree with the Advanced Practitioner's note, impression and recommendations. Enteroscopy tomorrow after Xarelto wash out.   Lucio Edward, MD [facility] [phone] Mon-Fri 8a-5p [phone] after 5p, weekends, holidays

## 2015-11-29 ENCOUNTER — Encounter (HOSPITAL_COMMUNITY): Payer: Self-pay

## 2015-11-29 ENCOUNTER — Encounter (HOSPITAL_COMMUNITY)
Admission: EM | Disposition: A | Discharge status: Skilled Nursing Facility | Payer: Self-pay | Source: Home / Self Care | Attending: Internal Medicine

## 2015-11-29 DIAGNOSIS — R1012 Left upper quadrant pain: Secondary | ICD-10-CM

## 2015-11-29 DIAGNOSIS — I5032 Chronic diastolic (congestive) heart failure: Secondary | ICD-10-CM

## 2015-11-29 HISTORY — PX: ENTEROSCOPY: SHX5533

## 2015-11-29 LAB — C DIFFICILE QUICK SCREEN W PCR REFLEX
C DIFFICILE (CDIFF) INTERP: NEGATIVE
C DIFFICILE (CDIFF) TOXIN: NEGATIVE
C DIFFICLE (CDIFF) ANTIGEN: NEGATIVE

## 2015-11-29 LAB — BASIC METABOLIC PANEL
Anion gap: 7 (ref 5–15)
BUN: 13 mg/dL (ref 6–20)
CHLORIDE: 111 mmol/L (ref 101–111)
CO2: 21 mmol/L — ABNORMAL LOW (ref 22–32)
Calcium: 8.3 mg/dL — ABNORMAL LOW (ref 8.9–10.3)
Creatinine, Ser: 0.9 mg/dL (ref 0.44–1.00)
GFR calc Af Amer: >60 mL/min (ref >60)
GFR calc non Af Amer: 53 mL/min — ABNORMAL LOW (ref >60)
Glucose, Bld: 139 mg/dL — ABNORMAL HIGH (ref 65–99)
POTASSIUM: 3.1 mmol/L — AB (ref 3.5–5.1)
SODIUM: 139 mmol/L (ref 135–145)

## 2015-11-29 LAB — GLUCOSE, CAPILLARY
GLUCOSE-CAPILLARY: 88 mg/dL (ref 65–99)
GLUCOSE-CAPILLARY: 114 mg/dL — AB (ref 65–99)
Glucose-Capillary: 84 mg/dL (ref 65–99)
Glucose-Capillary: 103 mg/dL — ABNORMAL HIGH (ref 65–99)

## 2015-11-29 LAB — CBC
HCT: 31.6 % — ABNORMAL LOW (ref 36.0–46.0)
HEMOGLOBIN: 10.3 g/dL — AB (ref 12.0–15.0)
MCH: 30.2 pg (ref 26.0–34.0)
MCHC: 32.6 g/dL (ref 30.0–36.0)
MCV: 92.7 fL (ref 78.0–100.0)
Platelets: 268 10*3/uL (ref 150–400)
RBC: 3.41 MIL/uL — AB (ref 3.87–5.11)
RDW: 14.1 % (ref 11.5–15.5)
WBC: 8.7 10*3/uL (ref 4.0–10.5)

## 2015-11-29 SURGERY — ENTEROSCOPY
Anesthesia: Moderate Sedation

## 2015-11-29 MED ORDER — LORATADINE 10 MG PO TABS
10.0000 mg | ORAL_TABLET | Freq: Every day | ORAL | Status: DC
  Administered 2015-11-29 – 2015-12-01 (×3): 10 mg via ORAL
  Filled 2015-11-29 (×3): qty 1

## 2015-11-29 MED ORDER — FENTANYL CITRATE (PF) 100 MCG/2ML IJ SOLN
INTRAMUSCULAR | Status: AC
  Filled 2015-11-29: qty 2

## 2015-11-29 MED ORDER — BUTAMBEN-TETRACAINE-BENZOCAINE 2-2-14 % EX AERO
INHALATION_SPRAY | CUTANEOUS | Status: DC | PRN
  Administered 2015-11-29: 2 via TOPICAL

## 2015-11-29 MED ORDER — TRAMADOL HCL 50 MG PO TABS
50.0000 mg | ORAL_TABLET | Freq: Three times a day (TID) | ORAL | Status: DC | PRN
  Administered 2015-11-29: 50 mg via ORAL
  Filled 2015-11-29: qty 1

## 2015-11-29 MED ORDER — FENTANYL CITRATE (PF) 100 MCG/2ML IJ SOLN
INTRAMUSCULAR | Status: DC | PRN
Start: 2015-11-29 — End: 2015-11-29
  Administered 2015-11-29 (×2): 25 ug via INTRAVENOUS

## 2015-11-29 MED ORDER — MIDAZOLAM HCL 5 MG/ML IJ SOLN
INTRAMUSCULAR | Status: AC
  Filled 2015-11-29: qty 2

## 2015-11-29 MED ORDER — GUAIFENESIN-DM 100-10 MG/5ML PO SYRP
5.0000 mL | ORAL_SOLUTION | ORAL | Status: DC | PRN
  Administered 2015-11-29 – 2015-11-30 (×2): 5 mL via ORAL
  Filled 2015-11-29 (×2): qty 5

## 2015-11-29 MED ORDER — DIPHENHYDRAMINE HCL 50 MG/ML IJ SOLN
INTRAMUSCULAR | Status: AC
Start: 2015-11-29 — End: 2015-11-29
  Filled 2015-11-29: qty 1

## 2015-11-29 MED ORDER — RIVAROXABAN 15 MG PO TABS
15.0000 mg | ORAL_TABLET | Freq: Every day | ORAL | Status: DC
  Administered 2015-11-29 – 2015-12-01 (×3): 15 mg via ORAL
  Filled 2015-11-29 (×3): qty 1

## 2015-11-29 MED ORDER — MIDAZOLAM HCL 10 MG/2ML IJ SOLN
INTRAMUSCULAR | Status: DC | PRN
  Administered 2015-11-29: 1 mg via INTRAVENOUS
  Administered 2015-11-29: 2 mg via INTRAVENOUS
  Administered 2015-11-29: 1 mg via INTRAVENOUS

## 2015-11-29 MED ORDER — PANTOPRAZOLE SODIUM 40 MG PO TBEC
40.0000 mg | DELAYED_RELEASE_TABLET | Freq: Two times a day (BID) | ORAL | Status: DC
  Administered 2015-11-29 – 2015-12-01 (×5): 40 mg via ORAL
  Filled 2015-11-29 (×5): qty 1

## 2015-11-29 NOTE — Interval H&P Note (Signed)
History and Physical Interval Note:  11/29/2015 8:08 AM  Ellen Peterson  has presented today for surgery, with the diagnosis of Nausea/vomiting and abnormal CT scan  The various methods of treatment have been discussed with the patient and family. After consideration of risks, benefits and other options for treatment, the patient has consented to  Procedure(s): ENTEROSCOPY (N/A) as a surgical intervention .  The patient's history has been reviewed, patient examined, no change in status, stable for surgery.  I have reviewed the patient's chart and labs.  Questions were answered to the patient's satisfaction.     Pricilla Riffle. [place] Plan

## 2015-11-29 NOTE — Op Note (Signed)
[facility] [place], [address]   ENTEROSCOPY PROCEDURE REPORT     EXAM DATE: 11/29/2015 PATIENT NAME:      Ellen Peterson, Ellen Peterson           MR #:      [id number] BIRTHDATE:       1920/02/24      VISIT #:     [id number]  ATTENDING:     Ladene Artist, MD, [facility]     STATUS:     outpatient  ASSISTANT:      William Dalton and Sherle Poe MD: Carol Ada, M.D. ASA CLASS:        Class III INDICATIONS:  The patient is a 81 yr old female here for an enteroscopy procedure due to nausea and vomiting, abdominal pain, and abnormal CT of jejunum. PROCEDURE PERFORMED:     Small bowel enteroscopy with biopsy MEDICATIONS:     Fentanyl 50 mcg IV and Versed 4 mg IV CONSENT: The patient understands the risks and benefits of the procedure and understands that these risks include, but are not limited to: sedation, allergic reaction, infection, perforation and/or bleeding. Alternative means of evaluation and treatment include, among others: physical exam, x-rays, and/or surgical intervention. The patient elects to proceed with this endoscopic procedure. DESCRIPTION OF PROCEDURE: During intra-op preparation period all mechanical & medical equipment was checked for proper function. Hand hygiene and appropriate measures for infection prevention was taken. After the risks, benefits and alternatives of the procedure were thoroughly explained, Informed consent was verified, confirmed and timeout was successfully executed by the treatment team. The    endoscope was introduced through the mouth and advanced to the proximal jejunum jejunum. The prep was The overall prep quality was excellent.. The instrument was then slowly withdrawn while examining the mucosa circumferentially. The scope was then completely withdrawn from the patient and the procedure terminated. The pulse, BP, and O2 saturation were monitored and documented by the physician and  the nursing staff throughout the entire procedure.  The patient was cared for as planned according to standard protocol, then discharged to recovery in stable condition and with appropriate post procedure care. Estimated blood loss is zero unless otherwise noted in this procedure report. STOMACH: A Billroth I anastomosis was found in the gastric antrum -healthy in appearance.  Mildly abnormal mucosa was found in the gastric body.  The mucosa was friable and erythematous.  Multiple biopsies were performed.   A few sessile polyps ranging between 2-29mm in size were found in the gastric body.  Multiple sampling biopsies were performed.   The stomach otherwise appeared normal.  DUODENUM: The duodenal mucosa showed no abnormalities in the entire duodenum. JEJUNUM: The exam showed no abnormalities in the proximal jejunum.  ESOPHAGUS: The mucosa of the esophagus appeared normal.  Small hiatal hernia on retroflexed view.  ADVERSE EVENTS:      There were no immediate complications.  IMPRESSIONS:     1.  Billroth I anastomosis in the gastric antrum 2.  Mild gastritis; multiple biopsies performed 3.  Few sessile polyps in the gastric body; multiple sampling biopsies performed 4.  Small hiatal hernia  RECOMMENDATIONS:     Await pathology results   Ladene Artist, MD, [facility] eSigned:  Ladene Artist, MD, [facility] 11/29/2015 8:43 AM

## 2015-11-29 NOTE — Progress Notes (Signed)
[facility] PROGRESS NOTE  Ellen Peterson [id number] DOB: 09-Aug-1920 DOA: 11/27/2015 PCP: [name], MD Brief history: 62 old female patient with past medical history of diet-controlled diabetes, remote colonic diverticulosis, GERD, dyslipidemia, hypertension, ongoing osteoarthritis, peripheral asked her disease, polymalgia rheumatica not on chronic steroids, recurrent UTI, left bundle-branch block and known CAD, DVT and PE on Xarelto who presents to the hospital because of ongoing left-sided abdominal pain now associated with nausea and vomiting.  Assessment/Plan: 1. 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  - gi consulted and recommendations given. Enteroscopy showed bill roth 1 anastomosis int he gastric antrum , with mild gastritis and small hiatal hernia. There are few sessile polyps int he gastric body with multiple biopsies done. Plan for CT entero with abdominal and pelvis with contrast in am.  - advance diet as tolerated.    2. Acute on CKD stage 2.: Improving with fluids.     3. Anemia, normocytic.  Baseline around 11,  Currently 9.8, monitor. Repeat today.  Get anemia panel.   4. Lower quadrant discomfort: urine cultures ordered.    5. DVT/PE: xarelto on hold for the procedure earlier this am. Plan to resume it today if okay with GI.    Code Status: DNR Family Communication: friends at bedside.  Disposition Plan: pending further investigation. Pending PT eval.    Consultants: gastroenterology Procedures:  Enteroscopy on 12/11  Antibiotics:  Levaquin.12/10  Nutrition: Soft diet.   HPI/Subjective: She denies any abdominal pain or nausea,.   Objective: Filed Vitals:   11/29/15 0850 11/29/15 1107  BP: 176/57 155/62  Pulse: 73 71  Temp:  98.3 F (36.8 C)  Resp: 13 18    Intake/Output Summary (Last 24 hours) at 11/29/15 1143 Last data  filed at 11/29/15 1119  Gross per 24 hour  Intake 1893.75 ml  Output      0 ml  Net 1893.75 ml   Filed Weights   11/27/15 1600 11/27/15 2006 11/28/15 2137  Weight: 68.4 kg (150 lb 12.7 oz) 68.2 kg (150 lb 5.7 oz) 71.124 kg (156 lb 12.8 oz)    Exam:   General:  Alert afebrile comfortable.   Cardiovascular: s1s2  Respiratory: ctab  Abdomen: soft non tender non distended bowel sounds heard.   Musculoskeletal: no pedal edema.   Data Reviewed: Basic Metabolic Panel:  Recent Labs Lab 11/27/15 1048 11/28/15 0552  NA 137 140  K 3.6 3.7  CL 101 108  CO2 24 23  GLUCOSE 140* 94  BUN 23* 22*  CREATININE 1.28* 1.21*  CALCIUM 9.2 8.5*   Liver Function Tests:  Recent Labs Lab 11/27/15 1048  AST 28  ALT 21  ALKPHOS 54  BILITOT 1.3*  PROT 6.7  ALBUMIN 3.3*    Recent Labs Lab 11/27/15 1048  LIPASE 30   No results for input(s): AMMONIA in the last 168 hours. CBC:  Recent Labs Lab 11/27/15 1048 11/28/15 0552  WBC 17.6* 9.9  NEUTROABS 15.1*  --   HGB 11.8* 9.8*  HCT 35.4* 29.3*  MCV 91.0 91.6  PLT 288 247   Cardiac Enzymes:  Recent Labs Lab 11/27/15 1048  TROPONINI 0.04*   BNP (last 3 results) No results for input(s): BNP in the last 8760 hours.  ProBNP (last 3 results) No results for input(s): PROBNP in the last 8760 hours.  CBG:  Recent Labs Lab 11/27/15 2005 11/28/15 0759 11/28/15 1641 11/28/15 2139  11/29/15 0725  GLUCAP 111* 80 91 85 84    No results found for this or any previous visit (from the past 240 hour(s)).   Studies: Ct Abdomen Pelvis W Contrast  11/27/2015  CLINICAL DATA:  Nausea and vomiting since yesterday. History of previous cholecystectomy. EXAM: CT ABDOMEN AND PELVIS WITH CONTRAST TECHNIQUE: Multidetector CT imaging of the abdomen and pelvis was performed using the standard protocol following bolus administration of intravenous contrast. CONTRAST:  69mL OMNIPAQUE IOHEXOL 300 MG/ML  SOLN COMPARISON:  10/12/2009 an  multiple previous FINDINGS: There is mild scarring and pleural thickening at the left base. No pericardial fluid. The liver has normal appearance. Previous cholecystectomy. The spleen is normal. The pancreas is normal. The adrenal glands are normal. The kidneys are normal, with multiple parapelvic cysts bilaterally. There may be mild renal atrophy on the left. The aorta shows atherosclerosis but no aneurysm. The IVC is normal. No retroperitoneal mass or adenopathy. No free intraperitoneal fluid or air. No evidence of obstruction. Have some concern about the appearance of the jejunum in the region just past the ligament of Treitz. The bowel appears questionably dilated and the contents appear somewhat unusual for the proximal small bowel. I question some stranding in the mesentery and the presence of some small regional lymph nodes. This raises the possibility of jejunal pathology which could be either inflammatory or neoplastic. Depending on the clinical acuity, and if her symptoms resolve, this may require further follow-up. The best way to look at this by imaging would be to perform CT enterography in 3 or 4 weeks if the study could be delayed that long. Ordinary degenerative changes affect the spine. No significant bone pathology. The patient has had previous hysterectomy. IMPRESSION: No definite acute finding. I question if the patient could have jejunal pathology in the left upper quadrant. I do not think the findings are conclusive. This could be due to enteritis or even a jejunal mass. Further evaluation by CT enterography in 3 or 4 weeks could evaluate this more clearly. Electronically Signed   By: Nelson Chimes M.D.   On: 11/27/2015 12:46    Scheduled Meds: . insulin aspart  0-5 Units Subcutaneous QHS  . insulin aspart  0-9 Units Subcutaneous TID WC  . levofloxacin (LEVAQUIN) IV  500 mg Intravenous Q48H  . loratadine  10 mg Oral Daily  . metoprolol tartrate  25 mg Oral BID  . pantoprazole  40 mg  Oral BID   Continuous Infusions: . sodium chloride 75 mL/hr at 11/28/15 2226    Principal Problem:   Regional enteritis of jejunum ([place]) Active Problems:   Anemia   IBS   Recurrent UTI   Polymyalgia rheumatica (HCC)   Hyperlipidemia   History of pulmonary embolism   Essential hypertension, benign   GERD (gastroesophageal reflux disease)   History of DVT (deep vein thrombosis)   CAD (coronary artery disease), native coronary artery   Pulmonary hypertension (HCC)   CKD (chronic kidney disease), stage III   Chronic diastolic heart failure (HCC)   Diabetes mellitus type 2, diet-controlled ([place])    Time spent: 25 minutes.     [facility] Pager [phone] If 7PM-7AM, please contact night-coverage at [email or web address], password [facility] 11/29/2015, 11:43 AM  LOS: 2 days

## 2015-11-29 NOTE — H&P (View-Only) (Signed)
GI Rounding Note Covering for Drs. Mann/Hung  11/28/2015, 8:18 AM  LOS: 1 day   SUBJECTIVE:       Not much if any abd pain.  Just feels like she needs to pass stool and all will be better.  Last large stool was on 12/5.  Tolerating clears but not hungry. No n/v  OBJECTIVE:         Vital signs in last 24 hours:    Temp:  [97.7 F (36.5 C)-98.8 F (37.1 C)] 97.7 F (36.5 C) (12/10 0802) Pulse Rate:  [66-109] 69 (12/10 0802) Resp:  [14-20] 18 (12/10 0802) BP: (106-147)/(44-97) 145/50 mmHg (12/10 0802) SpO2:  [95 %-100 %] 99 % (12/10 0802) Weight:  [68.2 kg (150 lb 5.7 oz)-68.4 kg (150 lb 12.7 oz)] 68.2 kg (150 lb 5.7 oz) (12/09 2006) Last BM Date: 11/26/15 Filed Weights   11/27/15 1600 11/27/15 2006  Weight: 68.4 kg (150 lb 12.7 oz) 68.2 kg (150 lb 5.7 oz)   General: comfortable, anxious.  Not toxic appearing    Heart: RRR Chest: clear bil.  No dyspnea Abdomen: soft, NT, ND.  BS active.   Extremities: no CCE Neuro/Psych:  Oriented x 3.  No limb weakness or tremor.  Somewhat anxious.   Intake/Output from previous day: 12/09 0701 - 12/10 0700 In: 546.3 [P.O.:360; I.V.:136.3; IV Piggyback:50] Out: 0   Intake/Output this shift:    Lab Results:  Recent Labs  11/27/15 1048 11/28/15 0552  WBC 17.6* 9.9  HGB 11.8* 9.8*  HCT 35.4* 29.3*  PLT 288 247   BMET  Recent Labs  11/27/15 1048 11/28/15 0552  NA 137 140  K 3.6 3.7  CL 101 108  CO2 24 23  GLUCOSE 140* 94  BUN 23* 22*  CREATININE 1.28* 1.21*  CALCIUM 9.2 8.5*   LFT  Recent Labs  11/27/15 1048  PROT 6.7  ALBUMIN 3.3*  AST 28  ALT 21  ALKPHOS 54  BILITOT 1.3*   PT/INR No results for input(s): LABPROT, INR in the last 72 hours. Hepatitis Panel No results for input(s): HEPBSAG, HCVAB, HEPAIGM, HEPBIGM in the last 72 hours.  Studies/Results: Ct Abdomen Pelvis W Contrast  11/27/2015  CLINICAL DATA:  Nausea and vomiting since yesterday. History  of previous cholecystectomy. EXAM: CT ABDOMEN AND PELVIS WITH CONTRAST TECHNIQUE: Multidetector CT imaging of the abdomen and pelvis was performed using the standard protocol following bolus administration of intravenous contrast. CONTRAST:  65mL OMNIPAQUE IOHEXOL 300 MG/ML  SOLN COMPARISON:  10/12/2009 an multiple previous FINDINGS: There is mild scarring and pleural thickening at the left base. No pericardial fluid. The liver has normal appearance. Previous cholecystectomy. The spleen is normal. The pancreas is normal. The adrenal glands are normal. The kidneys are normal, with multiple parapelvic cysts bilaterally. There may be mild renal atrophy on the left. The aorta shows atherosclerosis but no aneurysm. The IVC is normal. No retroperitoneal mass or adenopathy. No free intraperitoneal fluid or air. No evidence of obstruction. Have some concern about the appearance of the jejunum in the region just past the ligament of Treitz. The bowel appears questionably dilated and the contents appear somewhat unusual for the proximal small bowel. I question some stranding in the mesentery and the presence of some small regional lymph nodes. This raises the possibility of jejunal pathology which could be either inflammatory or neoplastic. Depending on the clinical acuity, and if her symptoms resolve, this may require further follow-up. The best way to look at this by imaging would  be to perform CT enterography in 3 or 4 weeks if the study could be delayed that long. Ordinary degenerative changes affect the spine. No significant bone pathology. The patient has had previous hysterectomy. IMPRESSION: No definite acute finding. I question if the patient could have jejunal pathology in the left upper quadrant. I do not think the findings are conclusive. This could be due to enteritis or even a jejunal mass. Further evaluation by CT enterography in 3 or 4 weeks could evaluate this more clearly. Electronically Signed   By: Nelson Chimes M.D.   On: 11/27/2015 12:46   Scheduled Meds: . sodium chloride   Intravenous STAT  . enoxaparin (LOVENOX) injection  1 mg/kg Subcutaneous Q24H  . insulin aspart  0-5 Units Subcutaneous QHS  . insulin aspart  0-9 Units Subcutaneous TID WC  . [START ON 11/29/2015] levofloxacin (LEVAQUIN) IV  750 mg Intravenous Q48H  . metoprolol tartrate  25 mg Oral BID   Continuous Infusions: . sodium chloride Stopped (11/27/15 1831)   PRN Meds:.acetaminophen **OR** acetaminophen, morphine injection, ondansetron **OR** ondansetron (ZOFRAN) IV  ASSESMENT:   *  N/V/abd pain.  CT with jejunal enteritis vs mass.   U/A negative.   *  Chronic Xarelto for hx DVT and PE    PLAN   *  Enterscopy tomorrow, after 2 day Xarelto washout.   *  Note contact precautions in pt who is not having diarrhea, last decent BM was 5 days ago.  I discontinued stool pathogen panel order.  Still on contact precautions for ? Norvirus.   *  Stop Lovenox, next dose due at 2000 today, to close to EGD time in AM.     Ellen Peterson  11/28/2015, 8:18 AM Pager: [phone]     Attending physician's note   I have taken an interval history, reviewed the chart and examined the patient. I agree with the Advanced Practitioner's note, impression and recommendations. Enteroscopy tomorrow after Xarelto wash out.   Lucio Edward, MD [facility] [phone] Mon-Fri 8a-5p [phone] after 5p, weekends, holidays

## 2015-11-29 NOTE — Progress Notes (Signed)
ANTICOAGULATION CONSULT NOTE - Follow Up Consult  Pharmacy Consult for Xarelto Indication: Hx of PE  Allergies  Allergen Reactions  . Metronidazole Hives  . Nitrofurantoin Hives  . Nsaids Other (See Comments)    REACTION: GI symptoms  . Rofecoxib Other (See Comments)    REACTION: GI symptoms  . Simvastatin Other (See Comments)    REACTION: myalgia  . Erythromycin Rash  . Metformin Diarrhea and Nausea And Vomiting  . Sulfamethoxazole-Trimethoprim Itching  . Tetracycline Rash    Patient Measurements: Height: 5' (152.4 cm) Weight: 156 lb 12.8 oz (71.124 kg) IBW/kg (Calculated) : 45.5  Vital Signs: Temp: 97.8 F (36.6 C) (12/11 1652) Temp Source: Oral (12/11 1652) BP: 134/55 mmHg (12/11 1652) Pulse Rate: 66 (12/11 1652)  Labs:  Recent Labs  11/27/15 1048 11/28/15 0552 11/29/15 1615  HGB 11.8* 9.8* 10.3*  HCT 35.4* 29.3* 31.6*  PLT 288 247 268  CREATININE 1.28* 1.21*  --   TROPONINI 0.04*  --   --     Estimated Creatinine Clearance: 25.5 mL/min (by C-G formula based on Cr of 1.21).   Assessment: 81 yo who presented with enteritis. Pt has a hx of PE that was put on Xarelto 15mg  PO qday at home. Now s/p enteroscopy today and to restart home Xarelto. MD's aware that patient has borderline renal function for Xarelto use but would like to restart. Hgb stable at 10.3, plts wnl. No s/s of bleed. SCr 1.21, CrCl ~25-45ml/min.  Goal of Therapy:  Monitor platelets by anticoagulation protocol: Yes   Plan:  Restart Xarelto 15mg  PO daily tonight Monitor CBC, s/s of bleed  Elenor Quinones, PharmD, BCPS Clinical Pharmacist Pager [phone] 11/29/2015 5:15 PM

## 2015-11-30 ENCOUNTER — Inpatient Hospital Stay (HOSPITAL_COMMUNITY): Payer: Medicare Other

## 2015-11-30 ENCOUNTER — Encounter (HOSPITAL_COMMUNITY): Payer: Self-pay | Admitting: Gastroenterology

## 2015-11-30 DIAGNOSIS — I10 Essential (primary) hypertension: Secondary | ICD-10-CM

## 2015-11-30 DIAGNOSIS — E119 Type 2 diabetes mellitus without complications: Secondary | ICD-10-CM

## 2015-11-30 LAB — BASIC METABOLIC PANEL
ANION GAP: 10 (ref 5–15)
BUN: 10 mg/dL (ref 6–20)
CALCIUM: 8.5 mg/dL — AB (ref 8.9–10.3)
CO2: 22 mmol/L (ref 22–32)
CREATININE: 0.91 mg/dL (ref 0.44–1.00)
Chloride: 111 mmol/L (ref 101–111)
GFR calc Af Amer: >60 mL/min (ref >60)
GFR, EST NON AFRICAN AMERICAN: 53 mL/min — AB (ref >60)
GLUCOSE: 98 mg/dL (ref 65–99)
Potassium: 3.3 mmol/L — ABNORMAL LOW (ref 3.5–5.1)
Sodium: 143 mmol/L (ref 135–145)

## 2015-11-30 LAB — URINE CULTURE: CULTURE: NO GROWTH

## 2015-11-30 LAB — IRON AND TIBC
Iron: 66 ug/dL (ref 28–170)
Saturation Ratios: 25 % (ref 10.4–31.8)
TIBC: 262 ug/dL (ref 250–450)
UIBC: 196 ug/dL

## 2015-11-30 LAB — CBC
HCT: 33.6 % — ABNORMAL LOW (ref 36.0–46.0)
HEMOGLOBIN: 10.8 g/dL — AB (ref 12.0–15.0)
MCH: 29.7 pg (ref 26.0–34.0)
MCHC: 32.1 g/dL (ref 30.0–36.0)
MCV: 92.3 fL (ref 78.0–100.0)
PLATELETS: 273 10*3/uL (ref 150–400)
RBC: 3.64 MIL/uL — ABNORMAL LOW (ref 3.87–5.11)
RDW: 14.2 % (ref 11.5–15.5)
WBC: 6.6 10*3/uL (ref 4.0–10.5)

## 2015-11-30 LAB — VITAMIN B12: VITAMIN B 12: 463 pg/mL (ref 180–914)

## 2015-11-30 LAB — RETICULOCYTES
RBC.: 3.64 MIL/uL — AB (ref 3.87–5.11)
RETIC COUNT ABSOLUTE: 65.5 10*3/uL (ref 19.0–186.0)
Retic Ct Pct: 1.8 % (ref 0.4–3.1)

## 2015-11-30 LAB — OCCULT BLOOD, POC DEVICE: FECAL OCCULT BLD: NEGATIVE

## 2015-11-30 LAB — GLUCOSE, CAPILLARY
GLUCOSE-CAPILLARY: 108 mg/dL — AB (ref 65–99)
GLUCOSE-CAPILLARY: 140 mg/dL — AB (ref 65–99)
Glucose-Capillary: 90 mg/dL (ref 65–99)
Glucose-Capillary: 99 mg/dL (ref 65–99)

## 2015-11-30 LAB — FERRITIN: Ferritin: 102 ng/mL (ref 11–307)

## 2015-11-30 LAB — FOLATE: FOLATE: 39.4 ng/mL (ref >5.9)

## 2015-11-30 MED ORDER — FUROSEMIDE 10 MG/ML IJ SOLN
20.0000 mg | Freq: Once | INTRAMUSCULAR | Status: AC
  Administered 2015-11-30: 20 mg via INTRAVENOUS
  Filled 2015-11-30: qty 2

## 2015-11-30 MED ORDER — POTASSIUM CHLORIDE CRYS ER 20 MEQ PO TBCR
40.0000 meq | EXTENDED_RELEASE_TABLET | Freq: Two times a day (BID) | ORAL | Status: AC
  Administered 2015-11-30 (×2): 40 meq via ORAL
  Filled 2015-11-30 (×2): qty 2

## 2015-11-30 NOTE — Evaluation (Signed)
Physical Therapy Evaluation Patient Details Name: Ellen Peterson MRN: [id number] DOB: November 08, 1920 Today's Date: 11/30/2015   History of Present Illness  This is a 25 old female patient with past medical history of diet-controlled diabetes, remote colonic diverticulosis, GERD, dyslipidemia, hypertension, ongoing osteoarthritis, peripheral asked her disease, polymalgia rheumatica not on chronic steroids, recurrent UTI, left bundle-branch block and known CAD, strict DVT and PE own Xarelto who presents to the hospital because of ongoing left-sided abdominal pain now associated with nausea and vomiting.  Clinical Impression  Pt admitted with above. Pt was living at home with assist from daughters t/o day with ADLs and IADLs. Pt reports minimal walking but now pt with severe urinary incontinence and can not safely care for self and has had subsequent generalized weakness and deconditioning requiring increased assist for transfers and ambulation. Pt to benefit from ST-SNF upon d/c to achieve safe mod I level of function for safe transition home alone.    Follow Up Recommendations SNF;Supervision/Assistance - 24 hour    Equipment Recommendations  None recommended by PT    Recommendations for Other Services       Precautions / Restrictions Precautions Precautions: Fall Precaution Comments: urinary incontinence Restrictions Weight Bearing Restrictions: No      Mobility  Bed Mobility Overal bed mobility: Needs Assistance Bed Mobility: Supine to Sit     Supine to sit: Min assist     General bed mobility comments: pt able to bring LEs off EOB, assit for trunk elevation, and to bring hips to EOB  Transfers Overall transfer level: Needs assistance Equipment used: Rolling walker (2 wheeled) Transfers: Sit to/from Stand Sit to Stand: Min assist         General transfer comment: v/c's for safe hand placement, assist to maintain standing  Ambulation/Gait Ambulation/Gait assistance: Min  assist Ambulation Distance (Feet): 15 Feet Assistive device: Rolling walker (2 wheeled) Gait Pattern/deviations: Step-through pattern;Decreased stride length (decreased step height) Gait velocity: slow Gait velocity interpretation: <1.8 ft/sec, indicative of risk for recurrent falls General Gait Details: v/c's to stay in walker, labored effort, short shuffled steps, easily fatigued  Stairs            Wheelchair Mobility    Modified Rankin (Stroke Patients Only)       Balance Overall balance assessment: Needs assistance Sitting-balance support: Feet supported;No upper extremity supported Sitting balance-Leahy Scale: Fair     Standing balance support: Bilateral upper extremity supported Standing balance-Leahy Scale: Poor Standing balance comment: requires UE assist                             Pertinent Vitals/Pain Pain Assessment: No/denies pain    Home Living Family/patient expects to be discharged to:: Skilled nursing facility Living Arrangements: Alone               Additional Comments: pt lives alone in 1 story home wiht steps to enter.pt reports doing minimal walking and that her daughters and grandchildren check on her daily.     Prior Function Level of Independence: Needs assistance   Gait / Transfers Assistance Needed: uses RW for household distances, minimal community amb  ADL's / Homemaking Assistance Needed: daughters assist with shower, has seat in shower        Hand Dominance   Dominant Hand: Right    Extremity/Trunk Assessment   Upper Extremity Assessment: Generalized weakness           Lower Extremity Assessment: Generalized  weakness      Cervical / Trunk Assessment: Kyphotic  Communication   Communication: No difficulties  Cognition Arousal/Alertness: Awake/alert Behavior During Therapy: WFL for tasks assessed/performed Overall Cognitive Status: Within Functional Limits for tasks assessed                       General Comments General comments (skin integrity, edema, etc.): pt with urinary incontinence upon standing. pt stood x4 with consecutive incontinence. donned brief to allow for short distance ambulation    Exercises        Assessment/Plan    PT Assessment Patient needs continued PT services  PT Diagnosis Difficulty walking;Generalized weakness   PT Problem List Decreased strength;Decreased range of motion;Decreased activity tolerance;Decreased balance;Decreased mobility;Decreased knowledge of use of DME  PT Treatment Interventions DME instruction;Gait training;Stair training;Functional mobility training;Therapeutic activities;Therapeutic exercise;Balance training   PT Goals (Current goals can be found in the Care Plan section) Acute Rehab PT Goals Patient Stated Goal: to get better PT Goal Formulation: With patient Time For Goal Achievement: 12/14/15 Potential to Achieve Goals: Good    Frequency Min 3X/week   Barriers to discharge Decreased caregiver support lives alone    Co-evaluation               End of Session Equipment Utilized During Treatment: Gait belt Activity Tolerance: Patient limited by fatigue Patient left: in chair;with call bell/phone within reach Nurse Communication: Mobility status (urinary incontinence)         Time: [id number] PT Time Calculation (min) (ACUTE ONLY): 23 min   Charges:   PT Evaluation $Initial PT Evaluation Tier I: 1 Procedure PT Treatments $Therapeutic Activity: 8-22 mins   PT G CodesKingsley Callander 11/30/2015, 10:38 AM   Kittie Plater, PT, DPT Pager #: [phone] Office #: [phone]

## 2015-11-30 NOTE — NC FL2 (Signed)
[place] LEVEL OF CARE SCREENING TOOL     IDENTIFICATION  Patient Name: Ellen Peterson Birthdate: 11/14/1920 Sex: female Admission Date (Current Location): 11/27/2015  [facility] and [place] Number: Herbalist and Address:   [facility] [address] [place], [place] [address]      Provider Number: [id number]  Attending Physician Name and Address:  Hosie Poisson, MD  Relative Name and Phone Number:   Denman George [phone] (mobile)    Current Level of Care: Hospital Recommended Level of Care: [place] Prior Approval Number:    Date Approved/Denied:   PASRR Number: [id number] (Eff. 06/17/14)  Discharge Plan: SNF    Current Diagnoses: Patient Active Problem List   Diagnosis Date Noted  . Regional enteritis of jejunum ([place]) 11/27/2015  . CKD (chronic kidney disease), stage III 11/27/2015  . Chronic diastolic heart failure ([place]) 11/27/2015  . Diabetes mellitus type 2, diet-controlled ([place]) 11/27/2015  . Abdominal pain, left lower quadrant 11/11/2015  . Left groin pain 11/11/2015  . Shortness of breath 08/20/2014  . History of DVT (deep vein thrombosis) 07/23/2014  . CAD (coronary artery disease), native coronary artery 07/23/2014  . Pulmonary hypertension ([place]) 07/23/2014  . Right ventricular dysfunction 07/23/2014  . HX: anticoagulation 07/23/2014  . Dysphasia 07/23/2014  . Ejection fraction   . Essential hypertension, benign 06/22/2014  . UI (urinary incontinence) 06/22/2014  . GERD (gastroesophageal reflux disease) 06/22/2014  . Physical deconditioning 06/22/2014  . History of pulmonary embolism 06/15/2014  . Encounter for Medicare annual wellness exam 08/08/2013  . LBBB (left bundle branch block)   . Hyperlipidemia   . Wide-complex tachycardia ([place])   . Venous insufficiency 06/03/2011  . Hyperglycemia 11/17/2010  . Anemia 02/08/2010  . Renal insufficiency 10/27/2009  . Recurrent UTI 09/22/2008  . Vitamin D  deficiency 09/10/2008  . COLONIC POLYPS, ADENOMATOUS 04/25/2007  . PERIPHERAL VASCULAR DISEASE 04/25/2007  . DIVERTICULOSIS, COLON 04/25/2007  . IBS 04/25/2007  . Osteoarthritis 04/25/2007  . Polymyalgia rheumatica ([place]) 04/25/2007    Orientation RESPIRATION BLADDER Height & Weight    Self, Place, Situation  Normal Incontinent   156 lbs.  BEHAVIORAL SYMPTOMS/MOOD NEUROLOGICAL BOWEL NUTRITION STATUS      Incontinent Diet (Soft)  AMBULATORY STATUS COMMUNICATION OF NEEDS Skin   Limited Assist (Physical therapy determined patient minimum assist for ambulation) Verbally Normal                       Personal Care Assistance Level of Assistance  Bathing, Feeding, Dressing Bathing Assistance: Limited assistance Feeding assistance: Independent Dressing Assistance: Limited assistance     Functional Limitations Info             SPECIAL CARE FACTORS FREQUENCY  PT (By licensed PT), Diabetic urine testing     PT Frequency: Evaluated 12/12 and a minimum of 3x per week recommended               Contractures Contractures Info: Not present    Additional Factors Info  Code Status, Insulin Sliding Scale Code Status Info: DNR     Insulin Sliding Scale Info: 4 times per day sliding scale (with meals and at bedtime)       Current Medications (11/30/2015):  This is the current hospital active medication list Current Facility-Administered Medications  Medication Dose Route Frequency Provider Last Rate Last Dose  . 0.9 %  sodium chloride infusion   Intravenous Continuous Belkys A Regalado, MD 75 mL/hr at  11/29/15 1227    . acetaminophen (TYLENOL) tablet 650 mg  650 mg Oral Q6H PRN Samella Parr, NP   650 mg at 11/29/15 2014   Or  . acetaminophen (TYLENOL) suppository 650 mg  650 mg Rectal Q6H PRN Samella Parr, NP      . guaiFENesin-dextromethorphan (ROBITUSSIN DM) 100-10 MG/5ML syrup 5 mL  5 mL Oral Q4H PRN Hosie Poisson, MD   5 mL at 11/29/15 1225  . insulin aspart  (novoLOG) injection 0-5 Units  0-5 Units Subcutaneous QHS Samella Parr, NP   0 Units at 11/27/15 2258  . insulin aspart (novoLOG) injection 0-9 Units  0-9 Units Subcutaneous TID WC Samella Parr, NP   0 Units at 11/27/15 1722  . levofloxacin (LEVAQUIN) IVPB 500 mg  500 mg Intravenous Q48H Kendra P Hiatt, RPH   500 mg at 11/29/15 1357  . loratadine (CLARITIN) tablet 10 mg  10 mg Oral Daily Hosie Poisson, MD   10 mg at 11/29/15 1225  . metoprolol tartrate (LOPRESSOR) tablet 25 mg  25 mg Oral BID Belkys A Regalado, MD   25 mg at 11/29/15 2247  . morphine 2 MG/ML injection 1 mg  1 mg Intravenous Q4H PRN Belkys A Regalado, MD      . ondansetron (ZOFRAN) tablet 4 mg  4 mg Oral Q6H PRN Samella Parr, NP   4 mg at 11/27/15 2200   Or  . ondansetron (ZOFRAN) injection 4 mg  4 mg Intravenous Q6H PRN Samella Parr, NP      . pantoprazole (PROTONIX) EC tablet 40 mg  40 mg Oral BID Ladene Artist, MD   40 mg at 11/29/15 2247  . potassium chloride SA (K-DUR,KLOR-CON) CR tablet 40 mEq  40 mEq Oral BID Hosie Poisson, MD      . Rivaroxaban (XARELTO) tablet 15 mg  15 mg Oral Q supper Reginia Naas, RPH   15 mg at 11/29/15 1824  . traMADol (ULTRAM) tablet 50 mg  50 mg Oral Q8H PRN Dianne Dun, NP   50 mg at 11/29/15 2014     Discharge Medications: Please see discharge summary for a list of discharge medications.  Relevant Imaging Results:  Relevant Lab Results:   Additional Information    Sharlet [place], Mila Homer, LCSW

## 2015-11-30 NOTE — Progress Notes (Signed)
[facility] PROGRESS NOTE  JACQUILYN BENOIT [id number] DOB: August 05, 1920 DOA: 11/27/2015 PCP: [name], MD Brief history: 64 old female patient with past medical history of diet-controlled diabetes, remote colonic diverticulosis, GERD, dyslipidemia, hypertension, ongoing osteoarthritis, peripheral asked her disease, polymalgia rheumatica not on chronic steroids, recurrent UTI, left bundle-branch block and known CAD, DVT and PE on Xarelto who presents to the hospital because of ongoing left-sided abdominal pain now associated with nausea and vomiting.  Assessment/Plan: 1. 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  - gi consulted and recommendations given. Enteroscopy showed bill roth 1 anastomosis int he gastric antrum , with mild gastritis and small hiatal hernia. There are few sessile polyps in the gastric body with multiple biopsies done. - advance diet as tolerated.  - recommend getting the repeat CT in 4 weeks.     2. Acute on CKD stage 2.: Improving with fluids.     3. Anemia, normocytic.  Baseline around 11, repeat is 10.8  4. Lower quadrant discomfort: urine cultures ordered. Negative so far.    5. DVT/PE: resume xarelto.   6. Productive cough: getting a CXR to evaluate for pneumonia.   7. Hypokalemia: replete as needed.   Code Status: DNR Family Communication: daughter at bedside.  Disposition Plan: SNF in 1 to 2 days.    Consultants: gastroenterology Procedures:  Enteroscopy on 12/11  Antibiotics:  Levaquin.12/10  Nutrition: Soft diet.   HPI/Subjective: She denies any abdominal pain or nausea,.   Objective: Filed Vitals:   11/30/15 0610 11/30/15 0752  BP: 176/72 152/62  Pulse: 69 73  Temp: 97.8 F (36.6 C) 97.8 F (36.6 C)  Resp: 18 17    Intake/Output Summary (Last 24 hours) at 11/30/15 1330 Last data filed at 11/30/15 0838  Gross per 24 hour  Intake   1570 ml  Output    300 ml  Net   1270 ml   Filed Weights   11/27/15 1600 11/27/15 2006 11/28/15 2137  Weight: 68.4 kg (150 lb 12.7 oz) 68.2 kg (150 lb 5.7 oz) 71.124 kg (156 lb 12.8 oz)    Exam:   General:  Alert afebrile comfortable.   Cardiovascular: s1s2  Respiratory: ctab  Abdomen: soft non tender non distended bowel sounds heard.   Musculoskeletal: no pedal edema.   Data Reviewed: Basic Metabolic Panel:  Recent Labs Lab 11/27/15 1048 11/28/15 0552 11/29/15 1615 11/30/15 0730  NA 137 140 139 143  K 3.6 3.7 3.1* 3.3*  CL 101 108 111 111  CO2 24 23 21* 22  GLUCOSE 140* 94 139* 98  BUN 23* 22* 13 10  CREATININE 1.28* 1.21* 0.90 0.91  CALCIUM 9.2 8.5* 8.3* 8.5*   Liver Function Tests:  Recent Labs Lab 11/27/15 1048  AST 28  ALT 21  ALKPHOS 54  BILITOT 1.3*  PROT 6.7  ALBUMIN 3.3*    Recent Labs Lab 11/27/15 1048  LIPASE 30   No results for input(s): AMMONIA in the last 168 hours. CBC:  Recent Labs Lab 11/27/15 1048 11/28/15 0552 11/29/15 1615 11/30/15 0730  WBC 17.6* 9.9 8.7 6.6  NEUTROABS 15.1*  --   --   --   HGB 11.8* 9.8* 10.3* 10.8*  HCT 35.4* 29.3* 31.6* 33.6*  MCV 91.0 91.6 92.7 92.3  PLT 288 247 268 273   Cardiac Enzymes:  Recent Labs Lab 11/27/15 1048  TROPONINI 0.04*   BNP (last 3 results)  No results for input(s): BNP in the last 8760 hours.  ProBNP (last 3 results) No results for input(s): PROBNP in the last 8760 hours.  CBG:  Recent Labs Lab 11/29/15 1109 11/29/15 1651 11/29/15 2102 11/30/15 0720 11/30/15 1122  GLUCAP 88 114* 103* 90 108*    Recent Results (from the past 240 hour(s))  C difficile quick scan w PCR reflex     Status: None   Collection Time: 11/29/15  3:54 PM  Result Value Ref Range Status   C Diff antigen NEGATIVE NEGATIVE Final   C Diff toxin NEGATIVE NEGATIVE Final   C Diff interpretation Negative for toxigenic C. difficile  Final  Urine culture      Status: None (Preliminary result)   Collection Time: 11/29/15  5:19 PM  Result Value Ref Range Status   Specimen Description URINE, CATHETERIZED  Final   Special Requests NONE  Final   Culture NO GROWTH < 24 HOURS  Final   Report Status PENDING  Incomplete     Studies: No results found.  Scheduled Meds: . insulin aspart  0-5 Units Subcutaneous QHS  . insulin aspart  0-9 Units Subcutaneous TID WC  . levofloxacin (LEVAQUIN) IV  500 mg Intravenous Q48H  . loratadine  10 mg Oral Daily  . metoprolol tartrate  25 mg Oral BID  . pantoprazole  40 mg Oral BID  . potassium chloride  40 mEq Oral BID  . rivaroxaban  15 mg Oral Q supper   Continuous Infusions: . sodium chloride 75 mL/hr at 11/29/15 1227    Principal Problem:   Regional enteritis of jejunum ([place]) Active Problems:   Anemia   IBS   Recurrent UTI   Polymyalgia rheumatica (H[facility])   Hyperlipidemia   History of pulmonary embolism   Essential hypertension, benign   GERD (gastroesophageal reflux disease)   History of DVT (deep vein thrombosis)   CAD (coronary artery disease), native coronary artery   Pulmonary hypertension (HCC)   CKD (chronic kidney disease), stage III   Chronic diastolic heart failure (H[facility])   Diabetes mellitus type 2, diet-controlled ([place])    Time spent: 25 minutes.     [facility] Pager [phone] If 7PM-7AM, please contact night-coverage at [email or web address], password [facility] 11/30/2015, 1:30 PM  LOS: 3 days

## 2015-11-30 NOTE — Clinical Social Work Note (Signed)
Clinical Social Work Assessment  Patient Details  Name: Ellen Peterson MRN: [id number] Date of Birth: 04-15-1920  Date of referral:  11/30/15               Reason for consult:  Facility Placement                Permission sought to share information with:  Family Supports Permission granted to share information::  Yes, Verbal Permission Granted (Daughter Vaughan Basta was in the room with patient during assessment. Patient agreed that daughter can be contacted by phone.)  Name::     [name]  Agency::     Relationship::  Daughter  Contact Information:  [phone] (home) and [phone] (mobile)  Housing/Transportation Living arrangements for the past 2 months:  [place] of Information:  Patient, Adult Children (Daughter [name])) Patient Interpreter Needed:  None Criminal Activity/Legal Involvement Pertinent to Current Situation/Hospitalization:  No - Comment as needed Significant Relationships:  Adult Children Lives with:  Self (Daughter explained that although patient lives alone, she and her sibling check on patient frequently.) Do you feel safe going back to the place where you live?   (Patient feel safe at home but she and family understand that patient needs rehab to be safer at home) Need for family participation in patient care:  Yes (Comment)  Care giving concerns:  None expressed by patient/daughter.   Social Worker assessment / plan:  CSW talked with patient and daughter [name] (at the bedside) regarding discharge planning and recommendation of ST rehab.  Patient was alert/oriented and able to engage with CSW in conversation. CSW was informed by daughter that patient does live alone, but has lots of support from her daughters and is mainly along at night. Their facility preference [facility] (2 daughters live near this facility) and 2nd choice is [facility].   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  Patient/Family's Understanding of and Emotional Response to Diagnosis, Current Treatment, and Prognosis:  Not discussed.  Emotional Assessment Appearance:  Appears stated age Attitude/Demeanor/Rapport:  Other (Appropriate) Affect (typically observed):  Appropriate Orientation:  Oriented to Self, Oriented to Place, Oriented to Situation Alcohol / Substance use:  Never Used Psych involvement (Current and /or in the community):  No (Comment)  Discharge Needs  Concerns to be addressed:  Discharge Planning Concerns Readmission within the last 30 days:  No Current discharge risk:  None Barriers to Discharge:  No Barriers Identified   Sable Feil, LCSW 11/30/2015, 2:16 PM

## 2015-11-30 NOTE — Clinical Social Work Placement (Signed)
   CLINICAL SOCIAL WORK PLACEMENT  NOTE  Date:  11/30/2015  Patient Details  Name: Ellen Peterson MRN: [id number] Date of Birth: January 04, 1920  Clinical Social Work is seeking post-discharge placement for this patient at the [place] level of care (*CSW will initial, date and re-position this form in  chart as items are completed):  Yes   Patient/family provided with [place] Work Department's list of facilities offering this level of care within the geographic area requested by the patient (or if unable, by the patient's family).  Yes   Patient/family informed of their freedom to choose among providers that offer the needed level of care, that participate in Medicare, Medicaid or managed care program needed by the patient, have an available bed and are willing to accept the patient.  Yes   Patient/family informed of [place]'s ownership interest in [facility] and [facility], as well as of the fact that they are under no obligation to receive care at these facilities.  PASRR submitted to EDS on       PASRR number received on       Existing PASRR number confirmed on 11/30/15     FL2 transmitted to all facilities in geographic area requested by pt/family on 11/30/15     FL2 transmitted to all facilities within larger geographic area on       Patient informed that his/her managed care company has contracts with or will negotiate with certain facilities, including the following:            Patient/family informed of bed offers received.  Patient chooses bed at       Physician recommends and patient chooses bed at      Patient to be transferred to   on  .  Patient to be transferred to facility by       Patient family notified on   of transfer.  Name of family member notified:        PHYSICIAN       Additional Comment:    _______________________________________________ Sable Feil, LCSW 11/30/2015, 2:25 PM

## 2015-12-01 DIAGNOSIS — R278 Other lack of coordination: Secondary | ICD-10-CM | POA: Diagnosis not present

## 2015-12-01 DIAGNOSIS — I10 Essential (primary) hypertension: Secondary | ICD-10-CM | POA: Diagnosis not present

## 2015-12-01 DIAGNOSIS — K5090 Crohn's disease, unspecified, without complications: Secondary | ICD-10-CM | POA: Diagnosis not present

## 2015-12-01 DIAGNOSIS — M6281 Muscle weakness (generalized): Secondary | ICD-10-CM | POA: Diagnosis not present

## 2015-12-01 DIAGNOSIS — I739 Peripheral vascular disease, unspecified: Secondary | ICD-10-CM | POA: Diagnosis not present

## 2015-12-01 DIAGNOSIS — I5032 Chronic diastolic (congestive) heart failure: Secondary | ICD-10-CM | POA: Diagnosis not present

## 2015-12-01 DIAGNOSIS — R262 Difficulty in walking, not elsewhere classified: Secondary | ICD-10-CM | POA: Diagnosis not present

## 2015-12-01 DIAGNOSIS — M069 Rheumatoid arthritis, unspecified: Secondary | ICD-10-CM | POA: Diagnosis not present

## 2015-12-01 DIAGNOSIS — Z86718 Personal history of other venous thrombosis and embolism: Secondary | ICD-10-CM | POA: Diagnosis not present

## 2015-12-01 DIAGNOSIS — D509 Iron deficiency anemia, unspecified: Secondary | ICD-10-CM | POA: Diagnosis not present

## 2015-12-01 DIAGNOSIS — I2510 Atherosclerotic heart disease of native coronary artery without angina pectoris: Secondary | ICD-10-CM | POA: Diagnosis not present

## 2015-12-01 DIAGNOSIS — R531 Weakness: Secondary | ICD-10-CM | POA: Diagnosis not present

## 2015-12-01 DIAGNOSIS — K219 Gastro-esophageal reflux disease without esophagitis: Secondary | ICD-10-CM | POA: Diagnosis not present

## 2015-12-01 DIAGNOSIS — M1990 Unspecified osteoarthritis, unspecified site: Secondary | ICD-10-CM | POA: Diagnosis not present

## 2015-12-01 DIAGNOSIS — K529 Noninfective gastroenteritis and colitis, unspecified: Secondary | ICD-10-CM | POA: Diagnosis not present

## 2015-12-01 DIAGNOSIS — Z79899 Other long term (current) drug therapy: Secondary | ICD-10-CM | POA: Diagnosis not present

## 2015-12-01 DIAGNOSIS — D649 Anemia, unspecified: Secondary | ICD-10-CM | POA: Diagnosis not present

## 2015-12-01 DIAGNOSIS — K50019 Crohn's disease of small intestine with unspecified complications: Secondary | ICD-10-CM | POA: Diagnosis not present

## 2015-12-01 DIAGNOSIS — R1314 Dysphagia, pharyngoesophageal phase: Secondary | ICD-10-CM | POA: Diagnosis not present

## 2015-12-01 LAB — BASIC METABOLIC PANEL
ANION GAP: 9 (ref 5–15)
BUN: 15 mg/dL (ref 6–20)
CHLORIDE: 107 mmol/L (ref 101–111)
CO2: 23 mmol/L (ref 22–32)
Calcium: 9 mg/dL (ref 8.9–10.3)
Creatinine, Ser: 1.02 mg/dL — ABNORMAL HIGH (ref 0.44–1.00)
GFR calc non Af Amer: 46 mL/min — ABNORMAL LOW (ref >60)
GFR, EST AFRICAN AMERICAN: 53 mL/min — AB (ref >60)
Glucose, Bld: 106 mg/dL — ABNORMAL HIGH (ref 65–99)
POTASSIUM: 3.6 mmol/L (ref 3.5–5.1)
SODIUM: 139 mmol/L (ref 135–145)

## 2015-12-01 LAB — GLUCOSE, CAPILLARY
GLUCOSE-CAPILLARY: 103 mg/dL — AB (ref 65–99)
GLUCOSE-CAPILLARY: 115 mg/dL — AB (ref 65–99)
Glucose-Capillary: 110 mg/dL — ABNORMAL HIGH (ref 65–99)

## 2015-12-01 MED ORDER — GUAIFENESIN-DM 100-10 MG/5ML PO SYRP: 5.0000 mL | ORAL_SOLUTION | ORAL | Status: DC | PRN

## 2015-12-01 MED ORDER — LEVOFLOXACIN 500 MG PO TABS
500.0000 mg | ORAL_TABLET | Freq: Once | ORAL | Status: AC
  Administered 2015-12-01: 500 mg via ORAL
  Filled 2015-12-01: qty 1

## 2015-12-01 MED ORDER — LEVOFLOXACIN 500 MG PO TABS: 500.0000 mg | ORAL_TABLET | ORAL | Status: DC

## 2015-12-01 NOTE — Care Management Important Message (Signed)
Important Message  Patient Details  Name: PERNIE BAGDASARYAN MRN: [id number] Date of Birth: [birth year]   Medicare Important Message Given:  Yes    Cashawn Yanko, Rory Percy, RN 12/01/2015, 11:37 AM

## 2015-12-01 NOTE — Progress Notes (Signed)
Report called to [facility] to [employer]". Daughter at bedside. Aware of transfer. All belongings gathered up for patient. IVs d/c'd. Site unremarkable. Skin intact. Vanessa,SW informed. Awaiting ambulance.

## 2015-12-01 NOTE — Clinical Social Work Placement (Signed)
   CLINICAL SOCIAL WORK PLACEMENT  NOTE 12/01/15 - DISCHARGED TO [facility]  Date:  12/01/2015  Patient Details  Name: Ellen Peterson MRN: [id number] Date of Birth: 1920-10-12  Clinical Social Work is seeking post-discharge placement for this patient at the [place] level of care (*CSW will initial, date and re-position this form in  chart as items are completed):  Yes   Patient/family provided with [place] Work Department's list of facilities offering this level of care within the geographic area requested by the patient (or if unable, by the patient's family).  Yes   Patient/family informed of their freedom to choose among providers that offer the needed level of care, that participate in Medicare, Medicaid or managed care program needed by the patient, have an available bed and are willing to accept the patient.  Yes   Patient/family informed of [place]'s ownership interest in [facility] and [facility], as well as of the fact that they are under no obligation to receive care at these facilities.  PASRR submitted to EDS on       PASRR number received on       Existing PASRR number confirmed on 11/30/15     FL2 transmitted to all facilities in geographic area requested by pt/family on 11/30/15     FL2 transmitted to all facilities within larger geographic area on       Patient informed that his/her managed care company has contracts with or will negotiate with certain facilities, including the following:         12/01/15 - Patient/family informed of bed offers received.  Patient [facility] bed at  [facility]  Physician recommends and patient chooses bed at      Patient to be transferred to  [facility] on  12/01/15.  Patient to be transferred to facility by  ambulance     Patient family notified on  12/01/15 of transfer.  Name of family member notified:   Daughter at the bedside: [name] and one other daughter.     PHYSICIAN       Additional Comment:    _______________________________________________ Sable Feil, LCSW 12/01/2015, 4:04 PM

## 2015-12-01 NOTE — Progress Notes (Signed)
Patient's BP 173/53. On call NP notified twice. Waiting for orders. RN will continue to monitor patient.   Ermalinda Memos, RN

## 2015-12-01 NOTE — Discharge Summary (Signed)
Physician Discharge Summary  Ellen Peterson [id number] DOB: 04/14/1920 DOA: 11/27/2015  PCP: [name], MD  Admit date: 11/27/2015 Discharge date: 12/01/2015  Time spent: 25 minutes  Recommendations for Outpatient Follow-up:   Follow up with PCP in one week.  Please follow up with Dr Benson Norway in 2 to4 weeks and get a repeat CT entero of the abdomen and pelvis .  Please check BMP in 2 days to check potassium level.  Please obtain a CXR in 4 weeks to evaluate for resolution of the pneumonia.  Discharge Diagnoses:  Principal Problem:   Regional enteritis of jejunum ([place]) Active Problems:   Anemia   IBS   Recurrent UTI   Polymyalgia rheumatica (HCC)   Hyperlipidemia   History of pulmonary embolism   Essential hypertension, benign   GERD (gastroesophageal reflux disease)   History of DVT (deep vein thrombosis)   CAD (coronary artery disease), native coronary artery   Pulmonary hypertension (HCC)   CKD (chronic kidney disease), stage III   Chronic diastolic heart failure (HCC)   Diabetes mellitus type 2, diet-controlled ([place])   Discharge Condition: improved  Diet recommendation: carb modified/ low sodium diet.   Filed Weights   11/27/15 1600 11/27/15 2006 11/28/15 2137  Weight: 68.4 kg (150 lb 12.7 oz) 68.2 kg (150 lb 5.7 oz) 71.124 kg (156 lb 12.8 oz)    History of present illness:  55 old female patient with past medical history of diet-controlled diabetes, remote colonic diverticulosis, GERD, dyslipidemia, hypertension, ongoing osteoarthritis, peripheral asked her disease, polymalgia rheumatica not on chronic steroids, recurrent UTI, left bundle-branch block and known CAD, DVT and PE on Xarelto who presents to the hospital because of ongoing left-sided abdominal pain now associated with nausea and vomiting. She also was found to have right sided upper lobe pneumonia.   Hospital Course:  1. Jejunal enteritis vs jejunal mass: - started on iv levaquin, IV gentle hydration  and symptomatic management with IV antiemetics and pain medications.  - gi consulted and recommendations given. Enteroscopy showed bill roth 1 anastomosis int he gastric antrum , with mild gastritis and small hiatal hernia. There are few sessile polyps int he gastric body with multiple biopsies done. Plan for CT entero with abdominal and pelvis with contrast in 2 to 4 weeks as per gi.  - advanceD diet as tolerated.   2. Acute on CKD stage 2.: Improved with fluids.     3. Anemia, normocytic.  Baseline around 11,  Currently 9.8, stable.  4. Lower quadrant discomfort: urine cultures ordered and have been negative so far.    5. DVT/PE: xarelto on hold for the procedure, which was resumed.   Procedures:  enteroscopy  Consultations:  gastroenterology  Discharge Exam: Filed Vitals:   12/01/15 0427 12/01/15 0858  BP: 173/53 160/57  Pulse: 66 79  Temp: 98.4 F (36.9 C) 98.1 F (36.7 C)  Resp: 19 18    General: alert afebrile comfortable Cardiovascular: s1s2 Respiratory: ctab  Discharge Instructions   Discharge Instructions    Diet - low sodium heart healthy    Complete by:  As directed      Discharge instructions    Complete by:  As directed   Follow up with PCP in one week.  Please follow up with Dr Benson Norway in 2 to4 weeks and get a repeat CT entero of the abdomen and pelvis .  Please check BMP in 2 days to check potassium level.  Please obtain a CXR in 4 weeks to evaluate  for resolution of the pneumonia.          Current Discharge Medication List    START taking these medications   Details  guaiFENesin-dextromethorphan (ROBITUSSIN DM) 100-10 MG/5ML syrup Take 5 mLs by mouth every 4 (four) hours as needed for cough. Qty: 118 mL, Refills: 0    levofloxacin (LEVAQUIN) 500 MG tablet Take 1 tablet (500 mg total) by mouth every other day. Qty: 3 tablet, Refills: 0      CONTINUE these medications which have NOT CHANGED   Details  acetaminophen (TYLENOL) 500 MG  tablet Take 500 mg by mouth every 6 (six) hours as needed (pain).     cholecalciferol (VITAMIN D) 1000 UNITS tablet Take 1,000 Units by mouth daily.      CINNAMON PO Take 1,000 mg by mouth daily.    diclofenac sodium (VOLTAREN) 1 % GEL Apply 2 g topically 2 (two) times daily as needed (knee pain). Qty: 6 Tube, Refills: 3    isosorbide mononitrate (IMDUR) 30 MG 24 hr tablet TAKE 1 TABLET BY MOUTH DAILY Qty: 30 tablet, Refills: 11    metoprolol tartrate (LOPRESSOR) 25 MG tablet TAKE 1 TABLET BY MOUTH TWICE A DAY Qty: 60 tablet, Refills: 11    Multiple Vitamins-Minerals (ICAPS PO) Take 1 capsule by mouth daily.    nitroGLYCERIN (NITROSTAT) 0.4 MG SL tablet Place 1 tablet (0.4 mg total) under the tongue every 5 (five) minutes as needed for chest pain. As directed and as needed. Qty: 10 tablet, Refills: 3    Omega-3 Fatty Acids (FISH OIL) 1000 MG CAPS Take 1,000 mg by mouth daily.    pantoprazole (PROTONIX) 40 MG tablet Take 1 tablet (40 mg total) by mouth 2 (two) times daily. Qty: 180 tablet, Refills: 3    rivaroxaban (XARELTO) 15 MG TABS tablet Take 1 tablet (15 mg total) by mouth daily with supper. Starting 07/08/14 Qty: 30 tablet, Refills: 6    traMADol (ULTRAM) 50 MG tablet TAKE 1 TABLET EVERY EIGHT HOURS AS NEEDED FOR PAIN Qty: 90 tablet, Refills: 3    valsartan (DIOVAN) 80 MG tablet Take 1 tablet (80 mg total) by mouth daily. Qty: 30 tablet, Refills: 5    vitamin E 400 UNIT capsule Take 400 Units by mouth daily.        STOP taking these medications     hydrochlorothiazide (HYDRODIURIL) 25 MG tablet        Allergies  Allergen Reactions  . Metronidazole Hives  . Nitrofurantoin Hives  . Nsaids Other (See Comments)    REACTION: GI symptoms  . Rofecoxib Other (See Comments)    REACTION: GI symptoms  . Simvastatin Other (See Comments)    REACTION: myalgia  . Erythromycin Rash  . Metformin Diarrhea and Nausea And Vomiting  . Sulfamethoxazole-Trimethoprim Itching  .  Tetracycline Rash   Follow-up Information    Follow up with [name], MD. Schedule an appointment as soon as possible for a visit in 1 week.   Specialties:  Family Medicine, Radiology   Contact information:   [address] [place] [address] [phone]       Follow up with Beryle Beams, MD. Schedule an appointment as soon as possible for a visit in 2 weeks.   Specialty:  Gastroenterology   Why:  please obtain a CT entero of the abdomen and pelvis.    Contact information:   [address] [place] [address] [place] [address] [phone]  The results of significant diagnostics from this hospitalization (including imaging, microbiology, ancillary and laboratory) are listed below for reference.    Significant Diagnostic Studies: Dg Chest 2 View  11/30/2015  CLINICAL DATA:  Cough.  Chest pain. EXAM: CHEST  2 VIEW COMPARISON:  06/15/2014 FINDINGS: Normal heart size. Aortic atherosclerosis. Pulmonary vascular congestion noted. Superimposed airspace opacity within the right upper lobe is identified worrisome for bronchopneumonia. IMPRESSION: 1. Right upper lobe bronchopneumonia. 2. Aortic atherosclerosis 3. Pulmonary vascular congestion. Electronically Signed   By: Kerby Moors M.D.   On: 11/30/2015 14:39   Ct Abdomen Pelvis W Contrast  11/27/2015  CLINICAL DATA:  Nausea and vomiting since yesterday. History of previous cholecystectomy. EXAM: CT ABDOMEN AND PELVIS WITH CONTRAST TECHNIQUE: Multidetector CT imaging of the abdomen and pelvis was performed using the standard protocol following bolus administration of intravenous contrast. CONTRAST:  24mL OMNIPAQUE IOHEXOL 300 MG/ML  SOLN COMPARISON:  10/12/2009 an multiple previous FINDINGS: There is mild scarring and pleural thickening at the left base. No pericardial fluid. The liver has normal appearance. Previous cholecystectomy. The spleen is normal. The pancreas is normal. The adrenal glands  are normal. The kidneys are normal, with multiple parapelvic cysts bilaterally. There may be mild renal atrophy on the left. The aorta shows atherosclerosis but no aneurysm. The IVC is normal. No retroperitoneal mass or adenopathy. No free intraperitoneal fluid or air. No evidence of obstruction. Have some concern about the appearance of the jejunum in the region just past the ligament of Treitz. The bowel appears questionably dilated and the contents appear somewhat unusual for the proximal small bowel. I question some stranding in the mesentery and the presence of some small regional lymph nodes. This raises the possibility of jejunal pathology which could be either inflammatory or neoplastic. Depending on the clinical acuity, and if her symptoms resolve, this may require further follow-up. The best way to look at this by imaging would be to perform CT enterography in 3 or 4 weeks if the study could be delayed that long. Ordinary degenerative changes affect the spine. No significant bone pathology. The patient has had previous hysterectomy. IMPRESSION: No definite acute finding. I question if the patient could have jejunal pathology in the left upper quadrant. I do not think the findings are conclusive. This could be due to enteritis or even a jejunal mass. Further evaluation by CT enterography in 3 or 4 weeks could evaluate this more clearly. Electronically Signed   By: Nelson Chimes M.D.   On: 11/27/2015 12:46   Dg Hip Unilat W Or W/o Pelvis 2-3 Views Left  11/11/2015  CLINICAL DATA:  Pain left groin. EXAM: DG HIP (WITH OR WITHOUT PELVIS) 2-3V LEFT COMPARISON:  None. FINDINGS: Degenerative changes lumbar spine and both hips. No acute bony or joint abnormality identified. No evidence of fracture dislocation. Pelvic calcifications are present consistent phleboliths. Pelvic clips and sutures are noted. IMPRESSION: No acute abnormality. Degenerative changes lumbar spine and both hips. Electronically Signed   By:  Marcello Moores  Register   On: 11/11/2015 15:26    Microbiology: Recent Results (from the past 240 hour(s))  C difficile quick scan w PCR reflex     Status: None   Collection Time: 11/29/15  3:54 PM  Result Value Ref Range Status   C Diff antigen NEGATIVE NEGATIVE Final   C Diff toxin NEGATIVE NEGATIVE Final   C Diff interpretation Negative for toxigenic C. difficile  Final  Urine culture     Status: None  Collection Time: 11/29/15  5:19 PM  Result Value Ref Range Status   Specimen Description URINE, CATHETERIZED  Final   Special Requests NONE  Final   Culture NO GROWTH 1 DAY  Final   Report Status 11/30/2015 FINAL  Final     Labs: Basic Metabolic Panel:  Recent Labs Lab 11/27/15 1048 11/28/15 0552 11/29/15 1615 11/30/15 0730  NA 137 140 139 143  K 3.6 3.7 3.1* 3.3*  CL 101 108 111 111  CO2 24 23 21* 22  GLUCOSE 140* 94 139* 98  BUN 23* 22* 13 10  CREATININE 1.28* 1.21* 0.90 0.91  CALCIUM 9.2 8.5* 8.3* 8.5*   Liver Function Tests:  Recent Labs Lab 11/27/15 1048  AST 28  ALT 21  ALKPHOS 54  BILITOT 1.3*  PROT 6.7  ALBUMIN 3.3*    Recent Labs Lab 11/27/15 1048  LIPASE 30   No results for input(s): AMMONIA in the last 168 hours. CBC:  Recent Labs Lab 11/27/15 1048 11/28/15 0552 11/29/15 1615 11/30/15 0730  WBC 17.6* 9.9 8.7 6.6  NEUTROABS 15.1*  --   --   --   HGB 11.8* 9.8* 10.3* 10.8*  HCT 35.4* 29.3* 31.6* 33.6*  MCV 91.0 91.6 92.7 92.3  PLT 288 247 268 273   Cardiac Enzymes:  Recent Labs Lab 11/27/15 1048  TROPONINI 0.04*   BNP: BNP (last 3 results) No results for input(s): BNP in the last 8760 hours.  ProBNP (last 3 results) No results for input(s): PROBNP in the last 8760 hours.  CBG:  Recent Labs Lab 11/30/15 1122 11/30/15 1626 11/30/15 2131 12/01/15 0740 12/01/15 1150  GLUCAP 108* 140* 99 110* 103*       Signed:  August Gosser  [facility] 12/01/2015, 1:49 PM

## 2015-12-01 NOTE — Care Management Note (Signed)
Case Management Note  Patient Details  Name: Ellen Peterson MRN: [id number] Date of Birth: [birth year]  Subjective/Objective:        CM following for progression and d/c planning.            Action/Plan: 12/01/2015 Met with pt and family, plan is to d/c to SNF . No HH or DME needs.   Expected Discharge Date:     12/01/2015             Expected Discharge Plan:  [place]  In-House Referral:  Clinical Social Work  Discharge planning Services  NA  Post Acute Care Choice:  NA Choice offered to:  NA  DME Arranged:  N/A DME Agency:  NA  HH Arranged:  NA HH Agency:  NA  Status of Service:  Completed, signed off  Medicare Important Message Given:  Yes Date Medicare IM Given:    Medicare IM give by:    Date Additional Medicare IM Given:    Additional Medicare Important Message give by:     If discussed at [place] of Stay Meetings, dates discussed:    Additional Comments:  Adron Bene, RN 12/01/2015, 11:39 AM

## 2015-12-02 ENCOUNTER — Encounter: Payer: Self-pay | Admitting: Gastroenterology

## 2015-12-02 ENCOUNTER — Telehealth: Payer: Self-pay | Admitting: Gastroenterology

## 2015-12-02 NOTE — Telephone Encounter (Signed)
Pathologist called regarding patient that you or Jyothi follow named Ellen Kontos MD:[id number] concerned about gastric biopsies showing dysplasia vs reactive inflammatory changes. I performed enteroscopy with biopsies on this inpatient this past weekend and signed her out to you on Sunday. Please review the biopsy report and consider follow up EGD with repeat biopsies.

## 2015-12-03 ENCOUNTER — Non-Acute Institutional Stay (SKILLED_NURSING_FACILITY): Payer: Medicare Other | Admitting: Internal Medicine

## 2015-12-03 ENCOUNTER — Other Ambulatory Visit: Payer: Self-pay | Admitting: *Deleted

## 2015-12-03 ENCOUNTER — Encounter: Payer: Self-pay | Admitting: Internal Medicine

## 2015-12-03 DIAGNOSIS — R531 Weakness: Secondary | ICD-10-CM

## 2015-12-03 DIAGNOSIS — K50019 Crohn's disease of small intestine with unspecified complications: Secondary | ICD-10-CM | POA: Diagnosis not present

## 2015-12-03 DIAGNOSIS — Z86718 Personal history of other venous thrombosis and embolism: Secondary | ICD-10-CM | POA: Diagnosis not present

## 2015-12-03 DIAGNOSIS — N183 Chronic kidney disease, stage 3 unspecified: Secondary | ICD-10-CM

## 2015-12-03 DIAGNOSIS — D649 Anemia, unspecified: Secondary | ICD-10-CM | POA: Diagnosis not present

## 2015-12-03 DIAGNOSIS — I2510 Atherosclerotic heart disease of native coronary artery without angina pectoris: Secondary | ICD-10-CM | POA: Diagnosis not present

## 2015-12-03 DIAGNOSIS — I10 Essential (primary) hypertension: Secondary | ICD-10-CM | POA: Diagnosis not present

## 2015-12-03 DIAGNOSIS — I5032 Chronic diastolic (congestive) heart failure: Secondary | ICD-10-CM | POA: Diagnosis not present

## 2015-12-03 DIAGNOSIS — M1990 Unspecified osteoarthritis, unspecified site: Secondary | ICD-10-CM

## 2015-12-03 MED ORDER — TRAMADOL HCL 50 MG PO TABS: ORAL_TABLET | ORAL | Status: DC

## 2015-12-03 NOTE — Progress Notes (Signed)
Patient ID: Ellen Peterson, female   DOB: 1920-04-23, 81 y.o.   MRN: [id number]     [facility]  PCP: [name], MD  Code Status: Full Code   Allergies  Allergen Reactions  . Metronidazole Hives  . Nitrofurantoin Hives  . Nsaids Other (See Comments)    REACTION: GI symptoms  . Rofecoxib Other (See Comments)    REACTION: GI symptoms  . Simvastatin Other (See Comments)    REACTION: myalgia  . Erythromycin Rash  . Metformin Diarrhea and Nausea And Vomiting  . Sulfamethoxazole-Trimethoprim Itching  . Tetracycline Rash    Chief Complaint  Patient presents with  . New Admit To SNF    New Admission      HPI:  81 y.o. patient is here for short term rehabilitation post hospital admission from 11/27/15-12/01/15 with left sided abdominal pain with nausea and vomiting. She underwent EGD showing jejunal enteritis and hiatal hernia. She also had right upper lobe pneumonia and acute on chronic renal failure. She was started on antibiotics, iv fluids, iv antiemetics and pain medication. She has past medical history of diet-controlled diabetes, PMR, remote colonic diverticulosis, GERD, dyslipidemia, hypertension, osteoarthritis, peripheral vascular disease, CAD, DVT and PE on Xarelto among others. She is seen in her room today. She gets tired easily with exertion. Her bowel movement is regular. Denies any abdominal pain today. Energy is returning slowly.    Review of Systems:  Constitutional: Negative for fever, chills and diaphoresis.  HENT: Negative for headache, congestion, nasal discharge Eyes: Negative for blurred vision, double vision and discharge.  Respiratory: Negative for cough, wheezing.  positive for dyspnea with exertion Cardiovascular: Negative for chest pain, palpitations, leg swelling.  Gastrointestinal: Negative for heartburn, nausea, vomiting, abdominal pain. Had bowel movement yesterday Genitourinary: Negative for dysuria  Musculoskeletal: Negative for  back pain, fall Skin: Negative for itching, rash.  Neurological: Negative for dizziness Psychiatric/Behavioral: Negative for depression   Past Medical History  Diagnosis Date  . Diabetes mellitus, type 2 ([place])   . Diverticulosis of colon   . GERD (gastroesophageal reflux disease)   . Hyperlipidemia   . Hypertension   . OA (osteoarthritis)   . Peripheral vascular disease ([place])   . PMR (polymyalgia rheumatica) (HCC)   . Frequent UTI   . Chest tightness     catheterization, 2005, normal coronaries  . Wide-complex tachycardia (HCC)     SVT in past  . Hypokalemia   . LBBB (left bundle branch block)     Since at least 2005  . Ejection fraction < 50%     EF 40% echo, 09/2009  . Complication of anesthesia   . PONV (postoperative nausea and vomiting)   . PE (pulmonary embolism) 06/16/2014  . Shortness of breath   . Recurrent UTI   . Enteritis 11/2015   Past Surgical History  Procedure Laterality Date  . Appendectomy  1993  . Cholecystectomy    . Colectomy  1993    partial  . Total abdominal hysterectomy  1960s    fibroids   . Vein ligation and stripping    . Bladder tack up    . Back surgery    . Esophagogastroduodenoscopy  05/2004    baretts esophagus, stomach polyps  . Bilateral pyelonephritis    . Cardiac catheterization  2005    no significant CAD  . Tonsillectomy    . Left heart catheterization with coronary angiogram N/A 06/12/2014    Procedure: LEFT HEART CATHETERIZATION WITH CORONARY  Cyril Loosen;  Surgeon: Peter M Martinique, MD;  Location[facility] LAB;  Service: Cardiovascular;  Laterality: N/A;  . [place]  . Enteroscopy N/A 11/29/2015    Procedure: ENTEROSCOPY;  Surgeon: Ladene Artist, MD;  Location[facility] ENDOSCOPY;  Service: Endoscopy;  Laterality: N/A;   Social History:   reports that she has never smoked. She has never used smokeless tobacco. She reports that she does not drink alcohol or use illicit drugs.  Family History  Problem Relation Age of  Onset  . CAD Mother   . Hypertension Mother   . Stroke Mother   . Pancreatic cancer Sister   . Diabetes Brother   . Heart Problems Brother   . Heart attack Mother   . Heart attack Brother     Medications:   Medication List       This list is accurate as of: 12/03/15 10:38 AM.  Always use your most recent med list.               acetaminophen 500 MG tablet  Commonly known as:  TYLENOL  Take 500 mg by mouth every 6 (six) hours as needed (pain).     cholecalciferol 1000 UNITS tablet  Commonly known as:  VITAMIN D  Take 1,000 Units by mouth daily.     CINNAMON PO  Take 1,000 mg by mouth daily.     diclofenac sodium 1 % Gel  Commonly known as:  VOLTAREN  Apply 2 g topically 2 (two) times daily as needed (knee pain).     Fish Oil 1000 MG Caps  Take 1,000 mg by mouth daily.     guaiFENesin-dextromethorphan 100-10 MG/5ML syrup  Commonly known as:  ROBITUSSIN DM  Take 5 mLs by mouth every 4 (four) hours as needed for cough.     ICAPS PO  Take 1 capsule by mouth daily.     isosorbide mononitrate 30 MG 24 hr tablet  Commonly known as:  IMDUR  TAKE 1 TABLET BY MOUTH DAILY     levofloxacin 500 MG tablet  Commonly known as:  LEVAQUIN  Take 1 tablet (500 mg total) by mouth every other day.     losartan 50 MG tablet  Commonly known as:  COZAAR  Take 50 mg by mouth daily.     metoprolol tartrate 25 MG tablet  Commonly known as:  LOPRESSOR  TAKE 1 TABLET BY MOUTH TWICE A DAY     nitroGLYCERIN 0.4 MG SL tablet  Commonly known as:  NITROSTAT  Place 1 tablet (0.4 mg total) under the tongue every 5 (five) minutes as needed for chest pain. As directed and as needed.     pantoprazole 40 MG tablet  Commonly known as:  PROTONIX  Take 1 tablet (40 mg total) by mouth 2 (two) times daily.     Rivaroxaban 15 MG Tabs tablet  Commonly known as:  XARELTO  Take 1 tablet (15 mg total) by mouth daily with supper. Starting 07/08/14     traMADol 50 MG tablet  Commonly known as:   ULTRAM  TAKE 1 TABLET EVERY EIGHT HOURS AS NEEDED FOR PAIN     vitamin E 400 UNIT capsule  Take 400 Units by mouth daily.         Physical Exam: Filed Vitals:   12/03/15 1028  BP: 107/66  Pulse: 86  Temp: 97.7 F (36.5 C)  TempSrc: Oral  Resp: 18  Height: 5' (1.524 m)  Weight: 155 lb 8 oz (  70.534 kg)  SpO2: 99%    General- elderly female, in no acute distress Head- normocephalic, atraumatic Throat- moist mucus membrane Eyes- PERRLA, EOMI, no pallor, no icterus, no discharge, normal conjunctiva, normal sclera Neck- no cervical lymphadenopathy Cardiovascular- normal s1,s2, no murmurs, trace leg edema Respiratory- bilateral clear to auscultation, no wheeze, no rhonchi, no crackles, no use of accessory muscles Abdomen- bowel sounds present, soft, non tender Musculoskeletal- able to move all 4 extremities, generalized weakness  Neurological- alert and oriented to person, place and time Skin- warm and dry Psychiatry- normal mood and affect    Labs reviewed: Basic Metabolic Panel:  Recent Labs  11/29/15 1615 11/30/15 0730 12/01/15 1230  NA 139 143 139  K 3.1* 3.3* 3.6  CL 111 111 107  CO2 21* 22 23  GLUCOSE 139* 98 106*  BUN 13 10 15   CREATININE 0.90 0.91 1.02*  CALCIUM 8.3* 8.5* 9.0   Liver Function Tests:  Recent Labs  12/30/14 1002 07/07/15 0901 11/27/15 1048  AST 24 21 28   ALT 16 17 21   ALKPHOS 48 53 54  BILITOT 0.8 0.5 1.3*  PROT 6.8 7.0 6.7  ALBUMIN 3.7 4.0 3.3*    Recent Labs  11/27/15 1048  LIPASE 30   No results for input(s): AMMONIA in the last 8760 hours. CBC:  Recent Labs  12/30/14 1002 11/11/15 1454 11/27/15 1048 11/28/15 0552 11/29/15 1615 11/30/15 0730  WBC 7.7 10.4 17.6* 9.9 8.7 6.6  NEUTROABS 4.7 7.3 15.1*  --   --   --   HGB 11.8* 11.7* 11.8* 9.8* 10.3* 10.8*  HCT 36.0 35.5* 35.4* 29.3* 31.6* 33.6*  MCV 93.7 91.5 91.0 91.6 92.7 92.3  PLT 277.0 319 288 247 268 273   Cardiac Enzymes:  Recent Labs  11/27/15 1048   TROPONINI 0.04*   BNP: Invalid input(s): POCBNP CBG:  Recent Labs  12/01/15 0740 12/01/15 1150 12/01/15 1621  GLUCAP 110* 103* 115*    Radiological Exams: Ct Abdomen Pelvis W Contrast  11/27/2015  CLINICAL DATA:  Nausea and vomiting since yesterday. History of previous cholecystectomy. EXAM: CT ABDOMEN AND PELVIS WITH CONTRAST TECHNIQUE: Multidetector CT imaging of the abdomen and pelvis was performed using the standard protocol following bolus administration of intravenous contrast. CONTRAST:  77mL OMNIPAQUE IOHEXOL 300 MG/ML  SOLN COMPARISON:  10/12/2009 an multiple previous FINDINGS: There is mild scarring and pleural thickening at the left base. No pericardial fluid. The liver has normal appearance. Previous cholecystectomy. The spleen is normal. The pancreas is normal. The adrenal glands are normal. The kidneys are normal, with multiple parapelvic cysts bilaterally. There may be mild renal atrophy on the left. The aorta shows atherosclerosis but no aneurysm. The IVC is normal. No retroperitoneal mass or adenopathy. No free intraperitoneal fluid or air. No evidence of obstruction. Have some concern about the appearance of the jejunum in the region just past the ligament of Treitz. The bowel appears questionably dilated and the contents appear somewhat unusual for the proximal small bowel. I question some stranding in the mesentery and the presence of some small regional lymph nodes. This raises the possibility of jejunal pathology which could be either inflammatory or neoplastic. Depending on the clinical acuity, and if her symptoms resolve, this may require further follow-up. The best way to look at this by imaging would be to perform CT enterography in 3 or 4 weeks if the study could be delayed that long. Ordinary degenerative changes affect the spine. No significant bone pathology. The patient has had previous  hysterectomy. IMPRESSION: No definite acute finding. I question if the patient  could have jejunal pathology in the left upper quadrant. I do not think the findings are conclusive. This could be due to enteritis or even a jejunal mass. Further evaluation by CT enterography in 3 or 4 weeks could evaluate this more clearly. Electronically Signed   By: Nelson Chimes M.D.   On: 11/27/2015 12:46    Assessment/Plan  Generalized weakness Will have her work with physical therapy and occupational therapy team to help with gait training and muscle strengthening exercises.fall precautions. Skin care. Encourage to be out of bed.   Enteritis of jejunum Continue levaquin 500 mg qod until 12/06/15. Afebrile and no more epigastric pain. To f/u with GI. Continue protonix 40 mg bid  Anemia normocytic Monitor cbc  ckd stage 3 With acute on chronic renal impairment, s/p iv fluids. Check bmp  DVT Continue xarelto  Chronic diastolic chf Stable, continue metoprolol, imdur, valsartan, monitor bmp  CAD Remains chest pain free. Continue metoprolol, valsartan, imdur and prn NTG.   HTN Reviewed BP reading, continue current regimen of antihypertensive as above, no changes made, monitor BP  OA Stable, continue prn tramadol and voltaren gel   Goals of care: short term rehabilitation   Labs/tests ordered: cbc with diff, cmp 12/06/16  Family/ staff Communication: reviewed care plan with patient and nursing supervisor    Blanchie Serve, MD  [place] [phone] (Monday-Friday 8 am - 5 pm) [phone] (afterhours)

## 2015-12-03 NOTE — Telephone Encounter (Signed)
[facility] 

## 2015-12-08 ENCOUNTER — Ambulatory Visit: Payer: Medicare Other | Admitting: Family Medicine

## 2015-12-08 ENCOUNTER — Other Ambulatory Visit: Payer: Self-pay | Admitting: Family Medicine

## 2015-12-08 NOTE — Telephone Encounter (Signed)
Looks like Rx may have been filled by nursing home already but will Rout to [facility] who handled Rx on 12/03/15 since it said Rx was printed

## 2015-12-16 ENCOUNTER — Non-Acute Institutional Stay (SKILLED_NURSING_FACILITY): Payer: Medicare Other | Admitting: Nurse Practitioner

## 2015-12-16 DIAGNOSIS — M1990 Unspecified osteoarthritis, unspecified site: Secondary | ICD-10-CM | POA: Diagnosis not present

## 2015-12-16 DIAGNOSIS — K219 Gastro-esophageal reflux disease without esophagitis: Secondary | ICD-10-CM | POA: Diagnosis not present

## 2015-12-16 DIAGNOSIS — I2510 Atherosclerotic heart disease of native coronary artery without angina pectoris: Secondary | ICD-10-CM

## 2015-12-16 DIAGNOSIS — I10 Essential (primary) hypertension: Secondary | ICD-10-CM

## 2015-12-16 DIAGNOSIS — N289 Disorder of kidney and ureter, unspecified: Secondary | ICD-10-CM

## 2015-12-16 DIAGNOSIS — K50019 Crohn's disease of small intestine with unspecified complications: Secondary | ICD-10-CM

## 2015-12-16 NOTE — Progress Notes (Signed)
Patient ID: Ellen Peterson, female   DOB: [birth year], 81 y.o.   MRN: [id number]    Nursing Home Location:  [place] [facility] SNF (31)  PCP: [name], MD  Allergies  Allergen Reactions  . Metronidazole Hives  . Nitrofurantoin Hives  . Nsaids Other (See Comments)    REACTION: GI symptoms  . Rofecoxib Other (See Comments)    REACTION: GI symptoms  . Simvastatin Other (See Comments)    REACTION: myalgia  . Erythromycin Rash  . Metformin Diarrhea and Nausea And Vomiting  . Sulfamethoxazole-Trimethoprim Itching  . Tetracycline Rash    Chief Complaint  Patient presents with  . Discharge Note    HPI:  Patient is a 81 y.o. female seen today at [facility] for discharge home. She has past medical history of diet-controlled diabetes, PMR, remote colonic diverticulosis, GERD, dyslipidemia, hypertension, osteoarthritis, peripheral vascular disease, CAD, DVT and PE on Xarelto Pt at [facility] for short term rehabilitation post hospital admission from 11/27/15-12/01/15 with left sided abdominal pain with nausea and vomiting.  Found to have jejunal enteritis and hiatal hernia via EGD.  She also had right upper lobe pneumonia and acute on chronic renal failure. Pt was treated with antibiotics, IV fluids, IV antiemetics and pain medication during hospitalization.  Her bowel movement is regular, denies N/V/diarrhea at this time. Does not have abdominal pain. Patient currently doing well with therapy, now stable to discharge home with caregivers and home health. Review of Systems:  Review of Systems  Constitutional: Negative for activity change, appetite change, fatigue and unexpected weight change.  HENT: Negative for congestion and hearing loss.   Eyes: Negative.   Respiratory: Negative for cough and shortness of breath.   Cardiovascular: Negative for chest pain, palpitations and leg swelling.  Gastrointestinal: Negative for abdominal pain,  diarrhea and constipation.  Genitourinary: Negative for dysuria and difficulty urinating.  Musculoskeletal: Negative for myalgias and arthralgias.  Skin: Negative for color change and wound.  Neurological: Positive for weakness. Negative for dizziness.  Psychiatric/Behavioral: Negative for behavioral problems, confusion and agitation.    Past Medical History  Diagnosis Date  . Diabetes mellitus, type 2 ([place])   . Diverticulosis of colon   . GERD (gastroesophageal reflux disease)   . Hyperlipidemia   . Hypertension   . OA (osteoarthritis)   . Peripheral vascular disease ([place])   . PMR (polymyalgia rheumatica) ([facility])   . Frequent UTI   . Chest tightness     catheterization, 2005, normal coronaries  . Wide-complex tachycardia ([facility])     SVT in past  . Hypokalemia   . LBBB (left bundle branch block)     Since at least 2005  . Ejection fraction < 50%     EF 40% echo, 09/2009  . Complication of anesthesia   . PONV (postoperative nausea and vomiting)   . PE (pulmonary embolism) 06/16/2014  . Shortness of breath   . Recurrent UTI   . Enteritis 11/2015   Past Surgical History  Procedure Laterality Date  . Appendectomy  1993  . Cholecystectomy    . Colectomy  1993    partial  . Total abdominal hysterectomy  1960s    fibroids   . Vein ligation and stripping    . Bladder tack up    . Back surgery    . Esophagogastroduodenoscopy  05/2004    baretts esophagus, stomach polyps  . Bilateral pyelonephritis    . Cardiac  catheterization  2005    no significant CAD  . Tonsillectomy    . Left heart catheterization with coronary angiogram N/A 06/12/2014    Procedure: LEFT HEART CATHETERIZATION WITH CORONARY ANGIOGRAM;  Surgeon: Peter M Martinique, MD;  Location: [facility] CATH LAB;  Service: Cardiovascular;  Laterality: N/A;  . [place]  . Enteroscopy N/A 11/29/2015    Procedure: ENTEROSCOPY;  Surgeon: Ladene Artist, MD;  Location: [facility] ENDOSCOPY;  Service: Endoscopy;  Laterality: N/A;    Social History:   reports that she has never smoked. She has never used smokeless tobacco. She reports that she does not drink alcohol or use illicit drugs.  Family History  Problem Relation Age of Onset  . CAD Mother   . Hypertension Mother   . Stroke Mother   . Pancreatic cancer Sister   . Diabetes Brother   . Heart Problems Brother   . Heart attack Mother   . Heart attack Brother     Medications: Patient's Medications  New Prescriptions   No medications on file  Previous Medications   ACETAMINOPHEN (TYLENOL) 500 MG TABLET    Take 500 mg by mouth every 6 (six) hours as needed (pain).    CHOLECALCIFEROL (VITAMIN D) 1000 UNITS TABLET    Take 1,000 Units by mouth daily.     CINNAMON PO    Take 1,000 mg by mouth daily.   DICLOFENAC SODIUM (VOLTAREN) 1 % GEL    Apply 2 g topically 2 (two) times daily as needed (knee pain).   GUAIFENESIN-DEXTROMETHORPHAN (ROBITUSSIN DM) 100-10 MG/5ML SYRUP    Take 5 mLs by mouth every 4 (four) hours as needed for cough.   ISOSORBIDE MONONITRATE (IMDUR) 30 MG 24 HR TABLET    TAKE 1 TABLET BY MOUTH DAILY   LOSARTAN (COZAAR) 50 MG TABLET    Take 50 mg by mouth daily.   METOPROLOL TARTRATE (LOPRESSOR) 25 MG TABLET    TAKE 1 TABLET BY MOUTH TWICE A DAY   MULTIPLE VITAMINS-MINERALS (ICAPS PO)    Take 1 capsule by mouth daily.   NITROGLYCERIN (NITROSTAT) 0.4 MG SL TABLET    Place 1 tablet (0.4 mg total) under the tongue every 5 (five) minutes as needed for chest pain. As directed and as needed.   OMEGA-3 FATTY ACIDS (FISH OIL) 1000 MG CAPS    Take 1,000 mg by mouth daily.   PANTOPRAZOLE (PROTONIX) 40 MG TABLET    Take 1 tablet (40 mg total) by mouth 2 (two) times daily.   RIVAROXABAN (XARELTO) 15 MG TABS TABLET    Take 1 tablet (15 mg total) by mouth daily with supper. Starting 07/08/14   TRAMADOL (ULTRAM) 50 MG TABLET    Take one tablet by mouth every 8 hours as needed for pain   VITAMIN E 400 UNIT CAPSULE    Take 400 Units by mouth daily.    Modified  Medications   No medications on file  Discontinued Medications   LEVOFLOXACIN (LEVAQUIN) 500 MG TABLET    Take 1 tablet (500 mg total) by mouth every other day.     Physical Exam: Filed Vitals:   12/16/15 1414  BP: 150/70  Pulse: 70  Temp: 98 F (36.7 C)  Resp: 20  Weight: 156 lb (70.761 kg)    Physical Exam  Constitutional: She is oriented to person, place, and time. No distress.  Elderly female in NAD  HENT:  Head: Normocephalic and atraumatic.  Mouth/Throat: Oropharynx is clear and moist. No oropharyngeal  exudate.  Eyes: Conjunctivae are normal. Pupils are equal, round, and reactive to light.  Neck: Normal range of motion. Neck supple.  Cardiovascular: Normal rate, regular rhythm and normal heart sounds.   Pulmonary/Chest: Effort normal and breath sounds normal.  Abdominal: Soft. Bowel sounds are normal.  Musculoskeletal: She exhibits no edema or tenderness.  Bilateral LE weakness 4/5  Neurological: She is alert and oriented to person, place, and time.  Skin: Skin is warm and dry. She is not diaphoretic.  Psychiatric: She has a normal mood and affect.    Labs reviewed: Basic Metabolic Panel:  Recent Labs  11/29/15 1615 11/30/15 0730 12/01/15 1230  NA 139 143 139  K 3.1* 3.3* 3.6  CL 111 111 107  CO2 21* 22 23  GLUCOSE 139* 98 106*  BUN 13 10 15   CREATININE 0.90 0.91 1.02*  CALCIUM 8.3* 8.5* 9.0   Liver Function Tests:  Recent Labs  12/30/14 1002 07/07/15 0901 11/27/15 1048  AST 24 21 28   ALT 16 17 21   ALKPHOS 48 53 54  BILITOT 0.8 0.5 1.3*  PROT 6.8 7.0 6.7  ALBUMIN 3.7 4.0 3.3*    Recent Labs  11/27/15 1048  LIPASE 30   No results for input(s): AMMONIA in the last 8760 hours. CBC:  Recent Labs  12/30/14 1002 11/11/15 1454 11/27/15 1048 11/28/15 0552 11/29/15 1615 11/30/15 0730  WBC 7.7 10.4 17.6* 9.9 8.7 6.6  NEUTROABS 4.7 7.3 15.1*  --   --   --   HGB 11.8* 11.7* 11.8* 9.8* 10.3* 10.8*  HCT 36.0 35.5* 35.4* 29.3* 31.6* 33.6*    MCV 93.7 91.5 91.0 91.6 92.7 92.3  PLT 277.0 319 288 247 268 273   TSH:  Recent Labs  12/30/14 1002 11/27/15 1925  TSH 2.60 0.528   A1C: Lab Results  Component Value Date   HGBA1C 6.1 07/07/2015   Lipid Panel:  Recent Labs  12/30/14 1002 07/07/15 0901  CHOL 237* 236*  HDL 53.40 69.40  LDLCALC 160* 142*  TRIG 116.0 121.0  CHOLHDL 4 3   Comprehensive Metabolic Panel Final 0000000 3:28PM 12/03/2015 7:15PM Is the patient fasting? : Yes TEST RESULT REF RANGE UNIT SODIUM 144 135-146 mmol/L POTASSIUM 4.0 3.5-5.3 mmol/L CHLORIDE H 111 95-109 mmol/L CO2 24.6 21.0-31.0 mmol/L Anion Gap 8 5-21 meq/L GLUCOSE 97 70-105 mg/dL UREA NITROGEN (BUN) H 26 5-25 mg/dL Creatinine, Serum 1.02 0.60-1.30 mg/dL BUN/CREATININE RATIO H 25.5 6.0-25.0 Ratio CALCIUM 8.5 8.2-10.5 mg/dL PROTEIN, TOTAL L 5.1 6.0-8.3 g/dL ALBUMIN L 2.97 3.50-5.50 g/dL GLOBULIN 2.1 1.5-4.5 g/dL ALBUMIN/GLOBULIN RATIO 1.4 0.8-2.5 Ratio BILIRUBIN, TOTAL 0.52 0.00-1.40 mg/dL ALKALINE PHOSPHATASE 51 41-130 U/L AST (SGOT) 15 0-39 U/L ALT (SGPT) 12 0-45 U/L Glomerular Filtration Rate L 53.74 >60.00 mls/min/1.73 m2 Glomerular Filtration Rate - African American > 60 > 60 mls/min/1.73 m2  CBC w/Diff & PLT Final 12/15/2015 2:54PM TEST RESULT REF RANGE UNIT White Blood Cell (WBC) 6.6 3.8-10.8 k/uL Red Blood Cell (RBC) L 3.3 3.5-5.5 M/uL Hemoglobin (HGB) L 9.7 11.4-16.0 g/dL Hematocrit (HCT) L 30.4 33.0-48.0 % Mean Corpuscular Volume (MCV) 93.0 79.0-100.0 fL Mean Corpuscular HGB (MCH) 29.7 26.8-33.2 pg Mean Corpuscular HGB Conc (MCHC) 31.9 30.5-36.0 g/dL RBC Dist Width (RDW) 14.6 9.0-15.0 % Platelet 312 150-450 k/uL Neutrophils% 56.7 50.0-80.0 % Lymphocytes% 21.5 21.0-51.0 % Monocytes% H 14.0 2.0-12.0 % Eosinophils% H 7.0 0.0-5.0 % Basophils% 0.5 0.0-2.0 % Neutrophils# 3.7 1.5-6.6 k/uL Lymphocytes # L 1.4 1.5-3.5 k/uL Monocytes # 0.9 0.1-1.0 k/uL Eosinophils# 0.5 0.0-0.6 k/uL Basophils# 0.0  0.0-0.1  k/uL Radiological Exams: Ct Abdomen Pelvis W Contrast  11/27/2015  CLINICAL DATA:  Nausea and vomiting since yesterday. History of previous cholecystectomy. EXAM: CT ABDOMEN AND PELVIS WITH CONTRAST TECHNIQUE: Multidetector CT imaging of the abdomen and pelvis was performed using the standard protocol following bolus administration of intravenous contrast. CONTRAST:  42mL OMNIPAQUE IOHEXOL 300 MG/ML  SOLN COMPARISON:  10/12/2009 an multiple previous FINDINGS: There is mild scarring and pleural thickening at the left base. No pericardial fluid. The liver has normal appearance. Previous cholecystectomy. The spleen is normal. The pancreas is normal. The adrenal glands are normal. The kidneys are normal, with multiple parapelvic cysts bilaterally. There may be mild renal atrophy on the left. The aorta shows atherosclerosis but no aneurysm. The IVC is normal. No retroperitoneal mass or adenopathy. No free intraperitoneal fluid or air. No evidence of obstruction. Have some concern about the appearance of the jejunum in the region just past the ligament of Treitz. The bowel appears questionably dilated and the contents appear somewhat unusual for the proximal small bowel. I question some stranding in the mesentery and the presence of some small regional lymph nodes. This raises the possibility of jejunal pathology which could be either inflammatory or neoplastic. Depending on the clinical acuity, and if her symptoms resolve, this may require further follow-up. The best way to look at this by imaging would be to perform CT enterography in 3 or 4 weeks if the study could be delayed that long. Ordinary degenerative changes affect the spine. No significant bone pathology. The patient has had previous hysterectomy. IMPRESSION: No definite acute finding. I question if the patient could have jejunal pathology in the left upper quadrant. I do not think the findings are conclusive. This could be due to enteritis or even  a jejunal mass. Further evaluation by CT enterography in 3 or 4 weeks could evaluate this more clearly. Electronically Signed   By: Nelson Chimes M.D.   On: 11/27/2015 12:46    Assessment/Plan 1. Regional enteritis of jejunum, unspecified complication ([place]) Symptoms improved, completed Levaquin. Appetite improved. Plans to follow up with GI as outpatient  2. Gastroesophageal reflux disease, esophagitis presence not specified Stable, conts on protonix 40 mg BID   3. Essential hypertension, benign -blood pressure stable, conts on losartan  4. Coronary artery disease involving native coronary artery of native heart without angina pectoris Without chest pains, conts on imdur daily  5. Osteoarthritis, unspecified osteoarthritis type, unspecified site -ongoing knee pain but stable, conts on Voltaren gel PRN and ultram for symptom management.   6. Renal insufficiency Recent Cr remains stable at 1.02. Cont good hydration and avoiding NSAIDS  pt is stable for discharge-will need PT/OT/HHA per home health. No DME needed. Rx written.  will need to follow up with PCP within 2 weeks and follow up with GI.      Carlos American. Harle Battiest  [facility] [phone] 8 am - 5 pm) [phone] (after hours)

## 2015-12-22 DIAGNOSIS — R933 Abnormal findings on diagnostic imaging of other parts of digestive tract: Secondary | ICD-10-CM | POA: Diagnosis not present

## 2015-12-22 DIAGNOSIS — K449 Diaphragmatic hernia without obstruction or gangrene: Secondary | ICD-10-CM | POA: Diagnosis not present

## 2015-12-22 DIAGNOSIS — I5023 Acute on chronic systolic (congestive) heart failure: Secondary | ICD-10-CM | POA: Diagnosis not present

## 2015-12-22 DIAGNOSIS — M6281 Muscle weakness (generalized): Secondary | ICD-10-CM | POA: Diagnosis not present

## 2015-12-22 DIAGNOSIS — K529 Noninfective gastroenteritis and colitis, unspecified: Secondary | ICD-10-CM | POA: Diagnosis not present

## 2015-12-22 DIAGNOSIS — I509 Heart failure, unspecified: Secondary | ICD-10-CM | POA: Diagnosis not present

## 2015-12-22 DIAGNOSIS — K219 Gastro-esophageal reflux disease without esophagitis: Secondary | ICD-10-CM | POA: Diagnosis not present

## 2015-12-22 DIAGNOSIS — R269 Unspecified abnormalities of gait and mobility: Secondary | ICD-10-CM | POA: Diagnosis not present

## 2015-12-22 DIAGNOSIS — K5730 Diverticulosis of large intestine without perforation or abscess without bleeding: Secondary | ICD-10-CM | POA: Diagnosis not present

## 2015-12-24 ENCOUNTER — Telehealth: Payer: Self-pay | Admitting: Family Medicine

## 2015-12-24 ENCOUNTER — Other Ambulatory Visit: Payer: Self-pay | Admitting: Family Medicine

## 2015-12-24 DIAGNOSIS — M6281 Muscle weakness (generalized): Secondary | ICD-10-CM | POA: Diagnosis not present

## 2015-12-24 DIAGNOSIS — K219 Gastro-esophageal reflux disease without esophagitis: Secondary | ICD-10-CM | POA: Diagnosis not present

## 2015-12-24 DIAGNOSIS — K50019 Crohn's disease of small intestine with unspecified complications: Secondary | ICD-10-CM | POA: Diagnosis not present

## 2015-12-24 DIAGNOSIS — M170 Bilateral primary osteoarthritis of knee: Secondary | ICD-10-CM | POA: Diagnosis not present

## 2015-12-24 NOTE — Telephone Encounter (Signed)
Ellen Peterson @ [facility] called Pt came out of rehab  Can you sign order for PT Once a week this week 3 x week for 4 weeks 2 x week for 4 weeks

## 2015-12-24 NOTE — Telephone Encounter (Signed)
Please verbally ok that  

## 2015-12-24 NOTE — Telephone Encounter (Signed)
Notified Mechele Claude of [facility] of Dr. Alba Cory message.

## 2015-12-25 ENCOUNTER — Telehealth: Payer: Self-pay | Admitting: Family Medicine

## 2015-12-25 ENCOUNTER — Emergency Department (HOSPITAL_COMMUNITY)
Admission: EM | Admit: 2015-12-25 | Discharge: 2015-12-25 | Disposition: A | Discharge status: Home / Self Care | Payer: Medicare Other | Attending: Emergency Medicine | Admitting: Emergency Medicine

## 2015-12-25 ENCOUNTER — Ambulatory Visit: Payer: Medicare Other | Admitting: Sports Medicine

## 2015-12-25 ENCOUNTER — Encounter (HOSPITAL_COMMUNITY): Payer: Self-pay

## 2015-12-25 ENCOUNTER — Emergency Department (HOSPITAL_COMMUNITY): Payer: Medicare Other

## 2015-12-25 DIAGNOSIS — N390 Urinary tract infection, site not specified: Secondary | ICD-10-CM | POA: Insufficient documentation

## 2015-12-25 DIAGNOSIS — R05 Cough: Secondary | ICD-10-CM | POA: Diagnosis present

## 2015-12-25 DIAGNOSIS — Z86711 Personal history of pulmonary embolism: Secondary | ICD-10-CM | POA: Insufficient documentation

## 2015-12-25 DIAGNOSIS — I10 Essential (primary) hypertension: Secondary | ICD-10-CM | POA: Diagnosis not present

## 2015-12-25 DIAGNOSIS — E119 Type 2 diabetes mellitus without complications: Secondary | ICD-10-CM | POA: Diagnosis not present

## 2015-12-25 DIAGNOSIS — Z7901 Long term (current) use of anticoagulants: Secondary | ICD-10-CM | POA: Insufficient documentation

## 2015-12-25 DIAGNOSIS — K219 Gastro-esophageal reflux disease without esophagitis: Secondary | ICD-10-CM | POA: Insufficient documentation

## 2015-12-25 DIAGNOSIS — M1990 Unspecified osteoarthritis, unspecified site: Secondary | ICD-10-CM | POA: Diagnosis not present

## 2015-12-25 DIAGNOSIS — R2243 Localized swelling, mass and lump, lower limb, bilateral: Secondary | ICD-10-CM | POA: Diagnosis not present

## 2015-12-25 DIAGNOSIS — Z9889 Other specified postprocedural states: Secondary | ICD-10-CM | POA: Insufficient documentation

## 2015-12-25 DIAGNOSIS — J069 Acute upper respiratory infection, unspecified: Secondary | ICD-10-CM | POA: Diagnosis not present

## 2015-12-25 DIAGNOSIS — J189 Pneumonia, unspecified organism: Secondary | ICD-10-CM | POA: Diagnosis not present

## 2015-12-25 DIAGNOSIS — Z79899 Other long term (current) drug therapy: Secondary | ICD-10-CM | POA: Diagnosis not present

## 2015-12-25 LAB — URINALYSIS, ROUTINE W REFLEX MICROSCOPIC
Bilirubin Urine: NEGATIVE
GLUCOSE, UA: NEGATIVE mg/dL
Hgb urine dipstick: NEGATIVE
Ketones, ur: NEGATIVE mg/dL
Nitrite: POSITIVE — AB
PROTEIN: NEGATIVE mg/dL
Specific Gravity, Urine: 1.019 (ref 1.005–1.030)
pH: 6 (ref 5.0–8.0)

## 2015-12-25 LAB — BASIC METABOLIC PANEL
ANION GAP: 10 (ref 5–15)
BUN: 21 mg/dL — ABNORMAL HIGH (ref 6–20)
CHLORIDE: 104 mmol/L (ref 101–111)
CO2: 25 mmol/L (ref 22–32)
CREATININE: 1.08 mg/dL — AB (ref 0.44–1.00)
Calcium: 9.4 mg/dL (ref 8.9–10.3)
GFR calc non Af Amer: 43 mL/min — ABNORMAL LOW (ref >60)
GFR, EST AFRICAN AMERICAN: 50 mL/min — AB (ref >60)
Glucose, Bld: 145 mg/dL — ABNORMAL HIGH (ref 65–99)
Potassium: 4 mmol/L (ref 3.5–5.1)
SODIUM: 139 mmol/L (ref 135–145)

## 2015-12-25 LAB — CBC
HCT: 36.5 % (ref 36.0–46.0)
HEMOGLOBIN: 11.9 g/dL — AB (ref 12.0–15.0)
MCH: 30.2 pg (ref 26.0–34.0)
MCHC: 32.6 g/dL (ref 30.0–36.0)
MCV: 92.6 fL (ref 78.0–100.0)
PLATELETS: 272 10*3/uL (ref 150–400)
RBC: 3.94 MIL/uL (ref 3.87–5.11)
RDW: 14.9 % (ref 11.5–15.5)
WBC: 15.2 10*3/uL — AB (ref 4.0–10.5)

## 2015-12-25 LAB — URINE MICROSCOPIC-ADD ON

## 2015-12-25 MED ORDER — CIPROFLOXACIN HCL 500 MG PO TABS
500.0000 mg | ORAL_TABLET | Freq: Once | ORAL | Status: AC
  Administered 2015-12-25: 500 mg via ORAL
  Filled 2015-12-25: qty 1

## 2015-12-25 MED ORDER — CIPROFLOXACIN HCL 250 MG PO TABS: 250.0000 mg | ORAL_TABLET | Freq: Two times a day (BID) | ORAL | Status: DC

## 2015-12-25 NOTE — Discharge Instructions (Signed)
Upper Respiratory Infection, Adult Most upper respiratory infections (URIs) are a viral infection of the air passages leading to the lungs. A URI affects the nose, throat, and upper air passages. The most common type of URI is nasopharyngitis and is typically referred to as "the common cold." URIs run their course and usually go away on their own. Most of the time, a URI does not require medical attention, but sometimes a bacterial infection in the upper airways can follow a viral infection. This is called a secondary infection. Sinus and middle ear infections are common types of secondary upper respiratory infections. Bacterial pneumonia can also complicate a URI. A URI can worsen asthma and chronic obstructive pulmonary disease (COPD). Sometimes, these complications can require emergency medical care and may be life threatening.  CAUSES Almost all URIs are caused by viruses. A virus is a type of germ and can spread from one person to another.  RISKS FACTORS You may be at risk for a URI if:   You smoke.   You have chronic heart or lung disease.  You have a weakened defense (immune) system.   You are very young or very old.   You have nasal allergies or asthma.  You work in crowded or poorly ventilated areas.  You work in health care facilities or schools. SIGNS AND SYMPTOMS  Symptoms typically develop 2-3 days after you come in contact with a cold virus. Most viral URIs last 7-10 days. However, viral URIs from the influenza virus (flu virus) can last 14-18 days and are typically more severe. Symptoms may include:   Runny or stuffy (congested) nose.   Sneezing.   Cough.   Sore throat.   Headache.   Fatigue.   Fever.   Loss of appetite.   Pain in your forehead, behind your eyes, and over your cheekbones (sinus pain).  Muscle aches.  DIAGNOSIS  Your health care provider may diagnose a URI by:  Physical exam.  Tests to check that your symptoms are not due to  another condition such as:  Strep throat.  Sinusitis.  Pneumonia.  Asthma. TREATMENT  A URI goes away on its own with time. It cannot be cured with medicines, but medicines may be prescribed or recommended to relieve symptoms. Medicines may help:  Reduce your fever.  Reduce your cough.  Relieve nasal congestion. HOME CARE INSTRUCTIONS   Take medicines only as directed by your health care provider.   Gargle warm saltwater or take cough drops to comfort your throat as directed by your health care provider.  Use a warm mist humidifier or inhale steam from a shower to increase air moisture. This may make it easier to breathe.  Drink enough fluid to keep your urine clear or pale yellow.   Eat soups and other clear broths and maintain good nutrition.   Rest as needed.   Return to work when your temperature has returned to normal or as your health care provider advises. You may need to stay home longer to avoid infecting others. You can also use a face mask and careful hand washing to prevent spread of the virus.  Increase the usage of your inhaler if you have asthma.   Do not use any tobacco products, including cigarettes, chewing tobacco, or electronic cigarettes. If you need help quitting, ask your health care provider. PREVENTION  The best way to protect yourself from getting a cold is to practice good hygiene.   Avoid oral or hand contact with people with cold  symptoms.   Wash your hands often if contact occurs.  There is no clear evidence that vitamin C, vitamin E, echinacea, or exercise reduces the chance of developing a cold. However, it is always recommended to get plenty of rest, exercise, and practice good nutrition.  SEEK MEDICAL CARE IF:   You are getting worse rather than better.   Your symptoms are not controlled by medicine.   You have chills.  You have worsening shortness of breath.  You have brown or red mucus.  You have yellow or brown nasal  discharge.  You have pain in your face, especially when you bend forward.  You have a fever.  You have swollen neck glands.  You have pain while swallowing.  You have white areas in the back of your throat. SEEK IMMEDIATE MEDICAL CARE IF:   You have severe or persistent:  Headache.  Ear pain.  Sinus pain.  Chest pain.  You have chronic lung disease and any of the following:  Wheezing.  Prolonged cough.  Coughing up blood.  A change in your usual mucus.  You have a stiff neck.  You have changes in your:  Vision.  Hearing.  Thinking.  Mood. MAKE SURE YOU:   Understand these instructions.  Will watch your condition.  Will get help right away if you are not doing well or get worse.   This information is not intended to replace advice given to you by your health care provider. Make sure you discuss any questions you have with your health care provider.   Document Released: 05/31/2001 Document Revised: 04/21/2015 Document Reviewed: 03/12/2014 Elsevier Interactive Patient Education 2016 Else[employer]  Urinary Tract Infection Urinary tract infections (UTIs) can develop anywhere along your urinary tract. Your urinary tract is your body's drainage system for removing wastes and extra water. Your urinary tract includes two kidneys, two ureters, a bladder, and a urethra. Your kidneys are a pair of bean-shaped organs. Each kidney is about the size of your fist. They are located below your ribs, one on each side of your spine. CAUSES Infections are caused by microbes, which are microscopic organisms, including fungi, viruses, and bacteria. These organisms are so small that they can only be seen through a microscope. Bacteria are the microbes that most commonly cause UTIs. SYMPTOMS  Symptoms of UTIs may vary by age and gender of the patient and by the location of the infection. Symptoms in young women typically include a frequent and intense urge to urinate and a  painful, burning feeling in the bladder or urethra during urination. Older women and men are more likely to be tired, shaky, and weak and have muscle aches and abdominal pain. A fever may mean the infection is in your kidneys. Other symptoms of a kidney infection include pain in your back or sides below the ribs, nausea, and vomiting. DIAGNOSIS To diagnose a UTI, your caregiver will ask you about your symptoms. Your caregiver will also ask you to provide a urine sample. The urine sample will be tested for bacteria and white blood cells. White blood cells are made by your body to help fight infection. TREATMENT  Typically, UTIs can be treated with medication. Because most UTIs are caused by a bacterial infection, they usually can be treated with the use of antibiotics. The choice of antibiotic and length of treatment depend on your symptoms and the type of bacteria causing your infection. HOME CARE INSTRUCTIONS  If you were prescribed antibiotics, take them exactly as your  caregiver instructs you. Finish the medication even if you feel better after you have only taken some of the medication.  Drink enough water and fluids to keep your urine clear or pale yellow.  Avoid caffeine, tea, and carbonated beverages. They tend to irritate your bladder.  Empty your bladder often. Avoid holding urine for long periods of time.  Empty your bladder before and after sexual intercourse.  After a bowel movement, women should cleanse from front to back. Use each tissue only once. SEEK MEDICAL CARE IF:   You have back pain.  You develop a fever.  Your symptoms do not begin to resolve within 3 days. SEEK IMMEDIATE MEDICAL CARE IF:   You have severe back pain or lower abdominal pain.  You develop chills.  You have nausea or vomiting.  You have continued burning or discomfort with urination. MAKE SURE YOU:   Understand these instructions.  Will watch your condition.  Will get help right away if you  are not doing well or get worse.   This information is not intended to replace advice given to you by your health care provider. Make sure you discuss any questions you have with your health care provider.   Document Released: 09/14/2005 Document Revised: 08/26/2015 Document Reviewed: 01/13/2012 Elsevier Interactive Patient Education [employer].

## 2015-12-25 NOTE — ED Provider Notes (Signed)
CSN: [id number]     Arrival date & time 12/25/15  1224 History   First MD Initiated Contact with Patient 12/25/15 1522     Chief Complaint  Patient presents with  . Weakness  . Cough     Patient is a 82 y.o. female presenting with weakness and cough. The history is provided by the patient and a relative. No language interpreter was used.  Weakness  Cough  Ellen Peterson is a 82 y.o. female who presents to the Emergency Department complaining of cough, generalized weakness.  Hx is provided by patient, daughters, caretaker.  She developed generalized weakness starting this morning with difficulty getting out of bed without assistance. Family wanted to take her to PCP today but could not get an appointment and were directed to the ED.  The reports mild increased cough/wheeze with O2 sat 92% at home.  Pt had some nausea earlier today, now gone.  She denies chest pain, abdominal pain, fevers, vomiting, diarrhea.  Sxs are moderate and constant.  She has similar sxs a month ago that were worse and had pneumonia that required hospital admission followed by rehab.     Past Medical History  Diagnosis Date  . Diabetes mellitus, type 2 ([place])   . Diverticulosis of colon   . GERD (gastroesophageal reflux disease)   . Hyperlipidemia   . Hypertension   . OA (osteoarthritis)   . Peripheral vascular disease ([place])   . PMR (polymyalgia rheumatica) (H[facility])   . Frequent UTI   . Chest tightness     catheterization, 2005, normal coronaries  . Wide-complex tachycardia ([facility])     SVT in past  . Hypokalemia   . LBBB (left bundle branch block)     Since at least 2005  . Ejection fraction < 50%     EF 40% echo, 09/2009  . Complication of anesthesia   . PONV (postoperative nausea and vomiting)   . PE (pulmonary embolism) 06/16/2014  . Shortness of breath   . Recurrent UTI   . Enteritis 11/2015   Past Surgical History  Procedure Laterality Date  . Appendectomy  1993  . Cholecystectomy    . Colectomy  1993     partial  . Total abdominal hysterectomy  1960s    fibroids   . Vein ligation and stripping    . Bladder tack up    . Back surgery    . Esophagogastroduodenoscopy  05/2004    baretts esophagus, stomach polyps  . Bilateral pyelonephritis    . Cardiac catheterization  2005    no significant CAD  . Tonsillectomy    . Left heart catheterization with coronary angiogram N/A 06/12/2014    Procedure: LEFT HEART CATHETERIZATION WITH CORONARY ANGIOGRAM;  Surgeon: Peter M Martinique, MD;  Location: [facility] CATH LAB;  Service: Cardiovascular;  Laterality: N/A;  . [place]  . Enteroscopy N/A 11/29/2015    Procedure: ENTEROSCOPY;  Surgeon: Ladene Artist, MD;  Location: [facility] ENDOSCOPY;  Service: Endoscopy;  Laterality: N/A;   Family History  Problem Relation Age of Onset  . CAD Mother   . Hypertension Mother   . Stroke Mother   . Pancreatic cancer Sister   . Diabetes Brother   . Heart Problems Brother   . Heart attack Mother   . Heart attack Brother    Social History  Substance Use Topics  . Smoking status: Never Smoker   . Smokeless tobacco: Never Used  . Alcohol Use: No   OB  History    No data available     Review of Systems  Respiratory: Positive for cough.   Neurological: Positive for weakness.  All other systems reviewed and are negative.     Allergies  Metronidazole; Nitrofurantoin; Nsaids; Rofecoxib; Simvastatin; Erythromycin; Metformin; Sulfamethoxazole-trimethoprim; and Tetracycline  Home Medications   Prior to Admission medications   Medication Sig Start Date End Date Taking? Authorizing Provider  acetaminophen (TYLENOL) 500 MG tablet Take 500 mg by mouth every 6 (six) hours as needed (pain).    Yes Historical Provider, MD  cholecalciferol (VITAMIN D) 1000 UNITS tablet Take 1,000 Units by mouth daily.     Yes Historical Provider, MD  CINNAMON PO Take 1,000 mg by mouth daily.   Yes Historical Provider, MD  diclofenac sodium (VOLTAREN) 1 % GEL Apply 2 g topically 2  (two) times daily as needed (knee pain). 01/06/15  Yes Abner Greenspan, MD  hydrochlorothiazide (HYDRODIURIL) 25 MG tablet Take 25 mg by mouth daily.  10/26/15  Yes Historical Provider, MD  isosorbide mononitrate (IMDUR) 30 MG 24 hr tablet TAKE 1 TABLET BY MOUTH DAILY Patient taking differently: TAKE 0.5 TABLET (15 mg) BY MOUTH BID 06/04/15  Yes Carlena Bjornstad, MD  losartan (COZAAR) 50 MG tablet Take 50 mg by mouth daily.   Yes Historical Provider, MD  metoprolol tartrate (LOPRESSOR) 25 MG tablet TAKE 1 TABLET BY MOUTH TWICE A DAY 06/04/15  Yes Carlena Bjornstad, MD  Multiple Vitamins-Minerals (ICAPS PO) Take 1 capsule by mouth daily.   Yes Historical Provider, MD  Multiple Vitamins-Minerals (MULTIVITAMIN & MINERAL PO) Take 1 tablet by mouth daily.   Yes Historical Provider, MD  Omega-3 Fatty Acids (FISH OIL) 1000 MG CAPS Take 1,000 mg by mouth daily.   Yes Historical Provider, MD  pantoprazole (PROTONIX) 40 MG tablet Take 1 tablet (40 mg total) by mouth 2 (two) times daily. 01/06/15  Yes Abner Greenspan, MD  rivaroxaban (XARELTO) 15 MG TABS tablet Take 1 tablet (15 mg total) by mouth daily with supper. Starting 07/08/14 04/08/15  Yes Carlena Bjornstad, MD  traMADol Veatrice Bourbon) 50 MG tablet Take one tablet by mouth every 8 hours as needed for pain 12/03/15  Yes Lauree Chandler, NP  valsartan (DIOVAN) 80 MG tablet Take 80 mg by mouth daily.  10/26/15  Yes Historical Provider, MD  vitamin E 400 UNIT capsule Take 400 Units by mouth daily.     Yes Historical Provider, MD  ciprofloxacin (CIPRO) 250 MG tablet Take 1 tablet (250 mg total) by mouth 2 (two) times daily. 12/25/15   Quintella Reichert, MD  guaiFENesin-dextromethorphan (ROBITUSSIN DM) 100-10 MG/5ML syrup Take 5 mLs by mouth every 4 (four) hours as needed for cough. 12/01/15   Hosie Poisson, MD  nitroGLYCERIN (NITROSTAT) 0.4 MG SL tablet Place 1 tablet (0.4 mg total) under the tongue every 5 (five) minutes as needed for chest pain. As directed and as needed. 07/11/14    Marne A Tower, MD   BP 176/75 mmHg  Pulse 79  Temp(Src) 99.3 F (37.4 C) (Oral)  Resp 15  SpO2 93% Physical Exam  Constitutional: She is oriented to person, place, and time. She appears well-developed and well-nourished.  HENT:  Head: Normocephalic and atraumatic.  Cardiovascular: Normal rate and regular rhythm.   No murmur heard. Pulmonary/Chest: Effort normal. No respiratory distress.  Occasional rhonchi bilaterally  Abdominal: Soft. There is no tenderness. There is no rebound and no guarding.  Musculoskeletal: She exhibits no tenderness.  Trace Nonpitting  edema of BLE  Neurological: She is alert and oriented to person, place, and time.  Generalized weakness  Skin: Skin is warm and dry.  Psychiatric: She has a normal mood and affect. Her behavior is normal.  Nursing note and vitals reviewed.   ED Course  Procedures (including critical care time) Labs Review Labs Reviewed  BASIC METABOLIC PANEL - Abnormal; Notable for the following:    Glucose, Bld 145 (*)    BUN 21 (*)    Creatinine, Ser 1.08 (*)    GFR calc non Af Amer 43 (*)    GFR calc Af Amer 50 (*)    All other components within normal limits  CBC - Abnormal; Notable for the following:    WBC 15.2 (*)    Hemoglobin 11.9 (*)    All other components within normal limits  URINALYSIS, ROUTINE W REFLEX MICROSCOPIC (NOT AT [facility]) - Abnormal; Notable for the following:    Nitrite POSITIVE (*)    Leukocytes, UA SMALL (*)    All other components within normal limits  URINE MICROSCOPIC-ADD ON - Abnormal; Notable for the following:    Squamous Epithelial / LPF 0-5 (*)    Bacteria, UA MANY (*)    Casts HYALINE CASTS (*)    All other components within normal limits  URINE CULTURE    Imaging Review Dg Chest 2 View  12/25/2015  CLINICAL DATA:  Recent pneumonia EXAM: CHEST  2 VIEW COMPARISON:  11/30/2015 FINDINGS: Interval clearing of right upper lobe infiltrate. No evidence of recurrent pneumonia Prominent lung markings  suggesting chronic lung disease which is mild. Apical scarring bilaterally. Lung volume normal. Negative for heart failure or effusion. Atherosclerotic aortic arch. IMPRESSION: Interval resolution of right upper lobe infiltrate. Prominent lung markings suggesting chronic lung disease. Electronically Signed   By: Franchot Gallo M.D.   On: 12/25/2015 13:50   I have personally reviewed and evaluated these images and lab results as part of my medical decision-making.   EKG Interpretation None      MDM   Final diagnoses:  Acute UTI  URI (upper respiratory infection)    Pt with hx/o pna, PE on xarelto here for cough, generalized weakness.  She is nontoxic appearing on examination with no respiratory distress.  She does demonstrate some generalized weakness in the ED.  No evidence of acute pna on exam or CXR.  UA is concerning for UTI - will treat with abx.  Family state she has responded well to cipro in the past.  Discussed dosing with hospital pharmacist given her renal insuffiency will give 250 mg BID.  D/w family treatment options of observation in the hospital vs d/c home with close follow up and family prefers trial at home with close follow up.  Return precautions were discussed.  Presentation not c/w sepsis, CHF, pna.     Quintella Reichert, MD 12/26/15 [phone]

## 2015-12-25 NOTE — Telephone Encounter (Signed)
See team health note on additional message

## 2015-12-25 NOTE — Telephone Encounter (Signed)
Daughter notified of Dr. Marliss Coots recommendations and verbalized understanding, daughter said she will take pt to ER

## 2015-12-25 NOTE — ED Notes (Signed)
Pt presents w/ increased weakness and productive cough.  Denies new pain.  Pt's daughter reports the Pt was admitted in Dec for n/v/d and possible PNA.  Pt went to Rehab after discharge and completed a round of antibiotics.  Daughter concerned about 92% O2 sat.

## 2015-12-25 NOTE — Telephone Encounter (Signed)
[place] CMA said would speak with Dr Glori Bickers.

## 2015-12-25 NOTE — Telephone Encounter (Signed)
See my prev comment - needs to go to the ED

## 2015-12-25 NOTE — Telephone Encounter (Signed)
I agree with adv to go to the hospital - I worry she may have pneumonia and we cannot provide all the services she needs quickly enough

## 2015-12-25 NOTE — Telephone Encounter (Signed)
[facility] called -  Pt called TH, Oxyen was at 92%, pt was having a hard time breathing.  TH recommended that she go to ED, daughter declined.  TH is working on sending a report over

## 2015-12-25 NOTE — Telephone Encounter (Signed)
Patient Name: Ellen Peterson  DOB: 09-02-1920    Initial Comment Caller states mother has been in the hospital, not feeling well, had pna in right lower lobe, o2 sat 92%, gets wheezy on exertion   Nurse Assessment  Nurse: Mallie Mussel, RN, Alveta Heimlich Date/Time Eilene Ghazi Time): 12/25/2015 10:43:51 AM  Confirm and document reason for call. If symptomatic, describe symptoms. ---Caller states that her mother's 02 sat is 92% on RA, she normally is 97-98%. . She wheezes whenever she tries to get up or do any activity. She was not able to get up today. She has a history of PE. She denies wheezing at present, she is sitting in a wheelchair. Denies fever. She was in the hospital in early December for pneumonia. She came home from rehab on Monday.  Has the patient traveled out of the country within the last 30 days? ---No  Does the patient have any new or worsening symptoms? ---Yes  Will a triage be completed? ---Yes  Related visit to physician within the last 2 weeks? ---No  Does the PT have any chronic conditions? (i.e. diabetes, asthma, etc.) ---Yes  List chronic conditions. ---Previous PE, HTN  Is this a behavioral health or substance abuse call? ---No     Guidelines    Guideline Title Affirmed Question Affirmed Notes  Breathing Difficulty History of prior "blood clot" in leg or lungs (i.e., deep vein thrombosis, pulmonary embolism)    Final Disposition User   Go to ED Now Mallie Mussel, RN, Alveta Heimlich    Comments  Caller declines to go to ER. Wants her to be seen at the office instead. I called the backline and spoke with Melissa and provided information. She verbalized understanding.   Referrals  GO TO FACILITY REFUSED   Disagree/Comply: Comply

## 2015-12-28 ENCOUNTER — Other Ambulatory Visit: Payer: Self-pay | Admitting: Cardiology

## 2015-12-28 LAB — URINE CULTURE: Culture: >=100000

## 2015-12-28 NOTE — Telephone Encounter (Signed)
Pt's daughter calling requesting samples of Xarelto 15 mg tablets. I explained to the daughter that I would leave a couple of bottles of Xarelto and an application for pt assistant, will be left up at the front desk for the pt to fill out and bring back as soon as possible to get assistant started. I advised the daughter that if the pt has any other problems, questions or concerns to call the office. Daughter verbalized understanding.

## 2015-12-29 DIAGNOSIS — K50019 Crohn's disease of small intestine with unspecified complications: Secondary | ICD-10-CM | POA: Diagnosis not present

## 2015-12-29 DIAGNOSIS — K219 Gastro-esophageal reflux disease without esophagitis: Secondary | ICD-10-CM | POA: Diagnosis not present

## 2015-12-29 DIAGNOSIS — M170 Bilateral primary osteoarthritis of knee: Secondary | ICD-10-CM | POA: Diagnosis not present

## 2015-12-29 DIAGNOSIS — M6281 Muscle weakness (generalized): Secondary | ICD-10-CM | POA: Diagnosis not present

## 2015-12-29 NOTE — Progress Notes (Signed)
ED Antimicrobial Stewardship Positive Culture Follow Up   Ellen Peterson is an 82 y.o. female who presented to [facility] on 12/25/2015 with a chief complaint of  Chief Complaint  Patient presents with  . Weakness  . Cough    Recent Results (from the past 720 hour(s))  C difficile quick scan w PCR reflex     Status: None   Collection Time: 11/29/15  3:54 PM  Result Value Ref Range Status   C Diff antigen NEGATIVE NEGATIVE Final   C Diff toxin NEGATIVE NEGATIVE Final   C Diff interpretation Negative for toxigenic C. difficile  Final  Urine culture     Status: None   Collection Time: 11/29/15  5:19 PM  Result Value Ref Range Status   Specimen Description URINE, CATHETERIZED  Final   Special Requests NONE  Final   Culture NO GROWTH 1 DAY  Final   Report Status 11/30/2015 FINAL  Final  Urine culture     Status: None   Collection Time: 12/25/15  3:57 PM  Result Value Ref Range Status   Specimen Description URINE, CATHETERIZED  Final   Special Requests NONE  Final   Culture   Final    >=100,000 COLONIES/mL STAPHYLOCOCCUS SPECIES (COAGULASE NEGATIVE) Performed at [facility]    Report Status 12/28/2015 FINAL  Final   Organism ID, Bacteria STAPHYLOCOCCUS SPECIES (COAGULASE NEGATIVE)  Final      Susceptibility   Staphylococcus species (coagulase negative) - MIC*    CIPROFLOXACIN >=8 RESISTANT Resistant     GENTAMICIN <=0.5 SENSITIVE Sensitive     NITROFURANTOIN <=16 SENSITIVE Sensitive     OXACILLIN >=4 RESISTANT Resistant     TETRACYCLINE <=1 SENSITIVE Sensitive     VANCOMYCIN <=0.5 SENSITIVE Sensitive     TRIMETH/SULFA <=10 SENSITIVE Sensitive     CLINDAMYCIN <=0.25 RESISTANT Resistant     RIFAMPIN <=0.5 SENSITIVE Sensitive     Inducible Clindamycin POSITIVE Resistant     * >=100,000 COLONIES/mL STAPHYLOCOCCUS SPECIES (COAGULASE NEGATIVE)    [x]  Treated with ciprofloxacin, organism resistant to prescribed antimicrobial   New antibiotic prescription: Bactrim DS take 1  by mouth daily for 5 days  ED Provider: Eugene Gavia, PA-C  Melburn Popper, PharmD Clinical Pharmacy Resident Pager: [phone] 12/29/2015 9:17 AM

## 2015-12-30 ENCOUNTER — Telehealth: Payer: Self-pay | Admitting: Family Medicine

## 2015-12-30 ENCOUNTER — Telehealth (HOSPITAL_COMMUNITY): Payer: Self-pay

## 2015-12-30 DIAGNOSIS — M170 Bilateral primary osteoarthritis of knee: Secondary | ICD-10-CM | POA: Diagnosis not present

## 2015-12-30 DIAGNOSIS — E559 Vitamin D deficiency, unspecified: Secondary | ICD-10-CM

## 2015-12-30 DIAGNOSIS — M6281 Muscle weakness (generalized): Secondary | ICD-10-CM | POA: Diagnosis not present

## 2015-12-30 DIAGNOSIS — K219 Gastro-esophageal reflux disease without esophagitis: Secondary | ICD-10-CM | POA: Diagnosis not present

## 2015-12-30 DIAGNOSIS — I10 Essential (primary) hypertension: Secondary | ICD-10-CM

## 2015-12-30 DIAGNOSIS — K50019 Crohn's disease of small intestine with unspecified complications: Secondary | ICD-10-CM | POA: Diagnosis not present

## 2015-12-30 DIAGNOSIS — E119 Type 2 diabetes mellitus without complications: Secondary | ICD-10-CM

## 2015-12-30 NOTE — Telephone Encounter (Signed)
Left voicemail for Heidi requesting more info on exactly what she is requesting from Dr. Glori Bickers

## 2015-12-30 NOTE — Telephone Encounter (Signed)
Post ED Visit - Positive Culture Follow-up: Successful Patient Follow-Up  Culture assessed and recommendations reviewed by: []  Elenor Quinones, Pharm.D. []  Heide Guile, Pharm.D., BCPS []  Parks Neptune, Pharm.D. []  Alycia Rossetti, Pharm.D., BCPS []  Summers, Pharm.D., BCPS, AAHIVP []  Legrand Como, Pharm.D., BCPS, AAHIVP []  Milus Glazier, Pharm.D. []  Stephens November, [place].DJohnney Killian, Pharm.D.  Positive urine culture>/= 100,000 colonies -> Staph Species  []  Patient discharged without antimicrobial prescription and treatment is now indicated [x]  Organism is resistant to prescribed ED discharge antimicrobial, Ciprofloxacin []  Patient with positive blood cultures  Changes discussed with ED provider: Everardo Pacific PA New antibiotic prescription "Bactrim DS daily x 5 days" Called to [facility] [phone]  Contacted patient, date 12/30/2015 time 16:42  Pt daughter/caregiver informed.  Rx called to [facility] [phone] and given to the RPh.   Dortha Kern 12/30/2015, 4:39 PM

## 2015-12-30 NOTE — Telephone Encounter (Signed)
?   More clarification- who is this / what for?--sorry

## 2015-12-30 NOTE — Telephone Encounter (Signed)
-----   Message from Ellamae Sia sent at 12/28/2015  3:56 PM EST ----- Regarding: Lab orders for Thursday, 1.19.17 Patient is scheduled for CPX labs, please order future labs, Thanks , Karna Christmas

## 2015-12-30 NOTE — Telephone Encounter (Signed)
Heidi needs verbal confirmation that she can treat

## 2015-12-31 DIAGNOSIS — K219 Gastro-esophageal reflux disease without esophagitis: Secondary | ICD-10-CM | POA: Diagnosis not present

## 2015-12-31 DIAGNOSIS — M170 Bilateral primary osteoarthritis of knee: Secondary | ICD-10-CM | POA: Diagnosis not present

## 2015-12-31 DIAGNOSIS — M6281 Muscle weakness (generalized): Secondary | ICD-10-CM | POA: Diagnosis not present

## 2015-12-31 DIAGNOSIS — K50019 Crohn's disease of small intestine with unspecified complications: Secondary | ICD-10-CM | POA: Diagnosis not present

## 2015-12-31 NOTE — Telephone Encounter (Signed)
Patient returned Shapales's call and said she needs a verbal order to treat twice a week for 7 weeks-Home Health Occupational Therapy.  You can call Heidi back at [phone].

## 2015-12-31 NOTE — Telephone Encounter (Signed)
Please verbally ok that Thanks  

## 2015-12-31 NOTE — Telephone Encounter (Signed)
Left voicemail giving Heidi the verbal order

## 2016-01-01 ENCOUNTER — Ambulatory Visit (INDEPENDENT_AMBULATORY_CARE_PROVIDER_SITE_OTHER): Payer: Medicare Other | Admitting: Family Medicine

## 2016-01-01 ENCOUNTER — Encounter: Payer: Self-pay | Admitting: Family Medicine

## 2016-01-01 VITALS — BP 124/72 | HR 62 | Temp 97.8°F | Wt 152.5 lb

## 2016-01-01 DIAGNOSIS — N390 Urinary tract infection, site not specified: Secondary | ICD-10-CM

## 2016-01-01 DIAGNOSIS — M170 Bilateral primary osteoarthritis of knee: Secondary | ICD-10-CM | POA: Diagnosis not present

## 2016-01-01 DIAGNOSIS — J189 Pneumonia, unspecified organism: Secondary | ICD-10-CM

## 2016-01-01 DIAGNOSIS — K219 Gastro-esophageal reflux disease without esophagitis: Secondary | ICD-10-CM | POA: Diagnosis not present

## 2016-01-01 DIAGNOSIS — K50019 Crohn's disease of small intestine with unspecified complications: Secondary | ICD-10-CM | POA: Diagnosis not present

## 2016-01-01 DIAGNOSIS — N289 Disorder of kidney and ureter, unspecified: Secondary | ICD-10-CM

## 2016-01-01 DIAGNOSIS — M6281 Muscle weakness (generalized): Secondary | ICD-10-CM | POA: Diagnosis not present

## 2016-01-01 NOTE — Progress Notes (Signed)
Subjective:    Patient ID: Ellen Peterson, female    DOB: Jun 03, 1920, 82 y.o.   MRN: [id number]  HPI Here for f/u of several hosp   Dec 12/9-12/13   Had been dx with regional enteritis of jejunum , also RUL pneumonia  Was on levaquin  Never checked ua until after abx   previous cholecystectomy.  EXAM: CT ABDOMEN AND PELVIS WITH CONTRAST  TECHNIQUE: Multidetector CT imaging of the abdomen and pelvis was performed using the standard protocol following bolus administration of intravenous contrast.  CONTRAST: 3mL OMNIPAQUE IOHEXOL 300 MG/ML SOLN  COMPARISON: 10/12/2009 an multiple previous  FINDINGS: There is mild scarring and pleural thickening at the left base. No pericardial fluid.  The liver has normal appearance. Previous cholecystectomy. The spleen is normal. The pancreas is normal. The adrenal glands are normal. The kidneys are normal, with multiple parapelvic cysts bilaterally. There may be mild renal atrophy on the left. The aorta shows atherosclerosis but no aneurysm. The IVC is normal. No retroperitoneal mass or adenopathy. No free intraperitoneal fluid or air. No evidence of obstruction. Have some concern about the appearance of the jejunum in the region just past the ligament of Treitz. The bowel appears questionably dilated and the contents appear somewhat unusual for the proximal small bowel. I question some stranding in the mesentery and the presence of some small regional lymph nodes. This raises the possibility of jejunal pathology which could be either inflammatory or neoplastic. Depending on the clinical acuity, and if her symptoms resolve, this may require further follow-up. The best way to look at this by imaging would be to perform CT enterography in 3 or 4 weeks if the study could be delayed that long.  Ordinary degenerative changes affect the spine. No significant bone pathology. The patient has had previous  hysterectomy.  IMPRESSION: No definite acute finding. I question if the patient could have jejunal pathology in the left upper quadrant. I do not think the findings are conclusive. This could be due to enteritis or even a jejunal mass. Further evaluation by CT enterography in 3 or 4 weeks could evaluate this more clearly.  Saw GI (Dr Collene Mares) - and she gave her an option for repeat-declined due to dec not to have surgery anyway   Pt feels improved but she is unable to have bm for 3 days  Not eating great  Took colace last night  Has been a little nauseated (but that is the whole mo)   Then rehab facility   Then ED again for uti  3 more doses of bactrim left for that (cx was resistant to cipro)  Feeling  Better Had cxr for cough and 02 sat of 92%  Has urol f/u with Dr Risa Grill in Feb  Now - cough is better  Urine symptoms are much improved  Still has a little side pain      Chemistry      Component Value Date/Time   NA 139 12/25/2015 1306   K 4.0 12/25/2015 1306   CL 104 12/25/2015 1306   CO2 25 12/25/2015 1306   BUN 21* 12/25/2015 1306   CREATININE 1.08* 12/25/2015 1306      Component Value Date/Time   CALCIUM 9.4 12/25/2015 1306   ALKPHOS 54 11/27/2015 1048   AST 28 11/27/2015 1048   ALT 21 11/27/2015 1048   BILITOT 1.3* 11/27/2015 1048      Lab Results  Component Value Date   WBC 15.2* 12/25/2015   HGB 11.9*  12/25/2015   HCT 36.5 12/25/2015   MCV 92.6 12/25/2015   PLT 272 12/25/2015    Weaker than she was before first hosp  PT coming out working with her Has family and sitter with her    Patient Active Problem List   Diagnosis Date Noted  . CAP (community acquired pneumonia) 01/02/2016  . Regional enteritis of jejunum ([place]) 11/27/2015  . CKD (chronic kidney disease), stage III 11/27/2015  . Chronic diastolic heart failure ([place]) 11/27/2015  . Diabetes mellitus type 2, diet-controlled ([place]) 11/27/2015  . Abdominal pain, left lower quadrant 11/11/2015   . Left groin pain 11/11/2015  . Shortness of breath 08/20/2014  . History of DVT (deep vein thrombosis) 07/23/2014  . CAD (coronary artery disease), native coronary artery 07/23/2014  . Pulmonary hypertension ([place]) 07/23/2014  . Right ventricular dysfunction 07/23/2014  . HX: anticoagulation 07/23/2014  . Dysphasia 07/23/2014  . Ejection fraction   . Essential hypertension, benign 06/22/2014  . UI (urinary incontinence) 06/22/2014  . GERD (gastroesophageal reflux disease) 06/22/2014  . Physical deconditioning 06/22/2014  . History of pulmonary embolism 06/15/2014  . Encounter for Medicare annual wellness exam 08/08/2013  . LBBB (left bundle branch block)   . Hyperlipidemia   . Wide-complex tachycardia ([place])   . Venous insufficiency 06/03/2011  . Hyperglycemia 11/17/2010  . Anemia 02/08/2010  . Renal insufficiency 10/27/2009  . Recurrent UTI 09/22/2008  . Vitamin D deficiency 09/10/2008  . COLONIC POLYPS, ADENOMATOUS 04/25/2007  . PERIPHERAL VASCULAR DISEASE 04/25/2007  . DIVERTICULOSIS, COLON 04/25/2007  . IBS 04/25/2007  . Osteoarthritis 04/25/2007  . Polymyalgia rheumatica ([place]) 04/25/2007   Past Medical History  Diagnosis Date  . Diabetes mellitus, type 2 ([place])   . Diverticulosis of colon   . GERD (gastroesophageal reflux disease)   . Hyperlipidemia   . Hypertension   . OA (osteoarthritis)   . Peripheral vascular disease ([place])   . PMR (polymyalgia rheumatica) ([facility])   . Frequent UTI   . Chest tightness     catheterization, 2005, normal coronaries  . Wide-complex tachycardia ([facility])     SVT in past  . Hypokalemia   . LBBB (left bundle branch block)     Since at least 2005  . Ejection fraction < 50%     EF 40% echo, 09/2009  . Complication of anesthesia   . PONV (postoperative nausea and vomiting)   . PE (pulmonary embolism) 06/16/2014  . Shortness of breath   . Recurrent UTI   . Enteritis 11/2015   Past Surgical History  Procedure Laterality Date  .  Appendectomy  1993  . Cholecystectomy    . Colectomy  1993    partial  . Total abdominal hysterectomy  1960s    fibroids   . Vein ligation and stripping    . Bladder tack up    . Back surgery    . Esophagogastroduodenoscopy  05/2004    baretts esophagus, stomach polyps  . Bilateral pyelonephritis    . Cardiac catheterization  2005    no significant CAD  . Tonsillectomy    . Left heart catheterization with coronary angiogram N/A 06/12/2014    Procedure: LEFT HEART CATHETERIZATION WITH CORONARY ANGIOGRAM;  Surgeon: Peter M Martinique, MD;  [facility] LAB;  Service: Cardiovascular;  Laterality: N/A;  . [place]  . Enteroscopy N/A 11/29/2015    Procedure: ENTEROSCOPY;  Surgeon: Ladene Artist, MD;  Location: [facility] ENDOSCOPY;  Service: Endoscopy;  Laterality: N/A;   Social History  Substance Use Topics  . Smoking status: Never Smoker   . Smokeless tobacco: Never Used  . Alcohol Use: No   Family History  Problem Relation Age of Onset  . CAD Mother   . Hypertension Mother   . Stroke Mother   . Pancreatic cancer Sister   . Diabetes Brother   . Heart Problems Brother   . Heart attack Mother   . Heart attack Brother    Allergies  Allergen Reactions  . Metronidazole Hives  . Nitrofurantoin Hives  . Nsaids Other (See Comments)    REACTION: GI symptoms  . Rofecoxib Other (See Comments)    REACTION: GI symptoms  . Simvastatin Other (See Comments)    REACTION: myalgia  . Erythromycin Rash  . Metformin Diarrhea and Nausea And Vomiting  . Sulfamethoxazole-Trimethoprim Itching  . Tetracycline Rash   Current Outpatient Prescriptions on File Prior to Visit  Medication Sig Dispense Refill  . acetaminophen (TYLENOL) 500 MG tablet Take 500 mg by mouth every 6 (six) hours as needed (pain).     . cholecalciferol (VITAMIN D) 1000 UNITS tablet Take 1,000 Units by mouth daily.      Marland Kitchen CINNAMON PO Take 1,000 mg by mouth daily.    . diclofenac sodium (VOLTAREN) 1 % GEL Apply 2 g  topically 2 (two) times daily as needed (knee pain). 6 Tube 3  . hydrochlorothiazide (HYDRODIURIL) 25 MG tablet Take 25 mg by mouth daily.     . isosorbide mononitrate (IMDUR) 30 MG 24 hr tablet TAKE 1 TABLET BY MOUTH DAILY (Patient taking differently: TAKE 0.5 TABLET (15 mg) BY MOUTH BID) 30 tablet 11  . losartan (COZAAR) 50 MG tablet Take 50 mg by mouth daily.    . metoprolol tartrate (LOPRESSOR) 25 MG tablet TAKE 1 TABLET BY MOUTH TWICE A DAY 60 tablet 11  . Multiple Vitamins-Minerals (ICAPS PO) Take 1 capsule by mouth daily.    . Multiple Vitamins-Minerals (MULTIVITAMIN & MINERAL PO) Take 1 tablet by mouth daily.    . nitroGLYCERIN (NITROSTAT) 0.4 MG SL tablet Place 1 tablet (0.4 mg total) under the tongue every 5 (five) minutes as needed for chest pain. As directed and as needed. 10 tablet 3  . Omega-3 Fatty Acids (FISH OIL) 1000 MG CAPS Take 1,000 mg by mouth daily.    . pantoprazole (PROTONIX) 40 MG tablet Take 1 tablet (40 mg total) by mouth 2 (two) times daily. 180 tablet 3  . rivaroxaban (XARELTO) 15 MG TABS tablet Take 1 tablet (15 mg total) by mouth daily with supper. Starting 07/08/14 30 tablet 6  . traMADol (ULTRAM) 50 MG tablet Take one tablet by mouth every 8 hours as needed for pain 90 tablet 0  . valsartan (DIOVAN) 80 MG tablet Take 80 mg by mouth daily.     . vitamin E 400 UNIT capsule Take 400 Units by mouth daily.       No current facility-administered medications on file prior to visit.     Review of Systems Review of Systems  Constitutional: Negative for fever, appetite change,  and unexpected weight change. pos for fatigue and generalized weakness that is improving with PT  Eyes: Negative for pain and visual disturbance.  Respiratory: Negative for cough and shortness of breath.   Cardiovascular: Negative for cp or palpitations    Gastrointestinal: Negative for nausea, diarrhea and constipation.  Genitourinary: Negative for urgency and frequency. pos for mild dysuria  that is much improved  Skin: Negative for pallor or rash  Neurological: Negative for weakness, light-headedness, numbness and headaches.  Hematological: Negative for adenopathy. Does not bruise/bleed easily.  Psychiatric/Behavioral: Negative for dysphoric mood. The patient is occ nervous/anxious.         Objective:   Physical Exam  Constitutional: She appears well-developed and well-nourished. No distress.  Frail appearing elderly female in wheelchair Mentally sharp  HENT:  Head: Normocephalic and atraumatic.  Mouth/Throat: Oropharynx is clear and moist.  Eyes: Conjunctivae and EOM are normal. Pupils are equal, round, and reactive to light.  Neck: Normal range of motion. Neck supple. No JVD present. Carotid bruit is not present. No thyromegaly present.  Cardiovascular: Normal rate, regular rhythm, normal heart sounds and intact distal pulses.  Exam reveals no gallop.   Pulmonary/Chest: Effort normal and breath sounds normal. No respiratory distress. She has no wheezes. She has no rales.  No crackles  Abdominal: Soft. Bowel sounds are normal. She exhibits no distension, no abdominal bruit and no mass. There is tenderness. There is no rebound.  No cva tenderness  Mild suprapubic tenderness  Musculoskeletal: She exhibits no edema or tenderness.  Lymphadenopathy:    She has no cervical adenopathy.  Neurological: She is alert. She has normal reflexes.  Skin: Skin is warm and dry. No rash noted.  Psychiatric: She has a normal mood and affect.  In good spirits suportive family present           Assessment & Plan:   Problem List Items Addressed This Visit      Respiratory   CAP (community acquired pneumonia)    This was treated during hospitalization -levaquin initially  Symptoms are improved  Cough is slowed down/no sob Reassuring exam Continue to watch  Hospital studies and notes reviewed       Relevant Medications   Sulfamethoxazole-Trimethoprim (SULFAMETHOXAZOLE-TMP  DS PO)     Digestive   Regional enteritis of jejunum ([place])    Pt f/u with her GI and states that this dx was doubted as cause of her symptoms  Chose not to re image or w/u further  No GI symptoms  Hospital records and studies rev with pt and family        Genitourinary   Recurrent UTI    Rev ED notes uti was resistant to cipr-now finishing bactrim and feeling much better For f/u with urol Dr Risa Grill soon for chronic uti Enc to keep up fluids -esp in light of renal insufficiency       Relevant Medications   Sulfamethoxazole-Trimethoprim (SULFAMETHOXAZOLE-TMP DS PO)   Renal insufficiency - Primary    Lab Results  Component Value Date   CREATININE 1.08* 12/25/2015   BUN 21* 12/25/2015   NA 139 12/25/2015   K 4.0 12/25/2015   CL 104 12/25/2015   CO2 25 12/25/2015   Improved from hosp (with fluids) Continue to follow  Enc to continue good fluid intake and family will assist

## 2016-01-01 NOTE — Patient Instructions (Signed)
I'm glad you are starting to feel better Keep fluid intake up  Try zyrtec 10 mg daily for itch - (benadryl is still ok as well)  Try miralax for constipation as needed (colace is ok also)   Finish antibiotics Keep urology follow up for Feb  Follow up with cardiology as planned   If uti symptoms worsen or cough return please let me know   Re schedule your PE for spring

## 2016-01-01 NOTE — Progress Notes (Signed)
Pre visit review using our clinic review tool, if applicable. No additional management support is needed unless otherwise documented below in the visit note. 

## 2016-01-02 DIAGNOSIS — J189 Pneumonia, unspecified organism: Secondary | ICD-10-CM | POA: Insufficient documentation

## 2016-01-02 NOTE — Assessment & Plan Note (Signed)
Pt f/u with her GI and states that this dx was doubted as cause of her symptoms  Chose not to re image or w/u further  No GI symptoms  Hospital records and studies rev with pt and family

## 2016-01-02 NOTE — Assessment & Plan Note (Addendum)
Lab Results  Component Value Date   CREATININE 1.08* 12/25/2015   BUN 21* 12/25/2015   NA 139 12/25/2015   K 4.0 12/25/2015   CL 104 12/25/2015   CO2 25 12/25/2015   Improved from hosp (with fluids) Continue to follow  Enc to continue good fluid intake and family will assist

## 2016-01-02 NOTE — Assessment & Plan Note (Signed)
This was treated during hospitalization -levaquin initially  Symptoms are improved  Cough is slowed down/no sob Reassuring exam Continue to watch  Hospital studies and notes reviewed

## 2016-01-02 NOTE — Assessment & Plan Note (Signed)
Rev ED notes uti was resistant to cipr-now finishing bactrim and feeling much better For f/u with urol Dr Risa Grill soon for chronic uti Enc to keep up fluids -esp in light of renal insufficiency

## 2016-01-05 DIAGNOSIS — M170 Bilateral primary osteoarthritis of knee: Secondary | ICD-10-CM | POA: Diagnosis not present

## 2016-01-05 DIAGNOSIS — M6281 Muscle weakness (generalized): Secondary | ICD-10-CM | POA: Diagnosis not present

## 2016-01-05 DIAGNOSIS — K50019 Crohn's disease of small intestine with unspecified complications: Secondary | ICD-10-CM | POA: Diagnosis not present

## 2016-01-05 DIAGNOSIS — I5032 Chronic diastolic (congestive) heart failure: Secondary | ICD-10-CM

## 2016-01-05 DIAGNOSIS — K219 Gastro-esophageal reflux disease without esophagitis: Secondary | ICD-10-CM | POA: Diagnosis not present

## 2016-01-05 DIAGNOSIS — I130 Hypertensive heart and chronic kidney disease with heart failure and stage 1 through stage 4 chronic kidney disease, or unspecified chronic kidney disease: Secondary | ICD-10-CM

## 2016-01-06 DIAGNOSIS — K219 Gastro-esophageal reflux disease without esophagitis: Secondary | ICD-10-CM | POA: Diagnosis not present

## 2016-01-06 DIAGNOSIS — M6281 Muscle weakness (generalized): Secondary | ICD-10-CM | POA: Diagnosis not present

## 2016-01-06 DIAGNOSIS — K50019 Crohn's disease of small intestine with unspecified complications: Secondary | ICD-10-CM | POA: Diagnosis not present

## 2016-01-06 DIAGNOSIS — M170 Bilateral primary osteoarthritis of knee: Secondary | ICD-10-CM | POA: Diagnosis not present

## 2016-01-07 ENCOUNTER — Other Ambulatory Visit: Payer: Medicare Other

## 2016-01-07 DIAGNOSIS — M170 Bilateral primary osteoarthritis of knee: Secondary | ICD-10-CM | POA: Diagnosis not present

## 2016-01-07 DIAGNOSIS — K50019 Crohn's disease of small intestine with unspecified complications: Secondary | ICD-10-CM | POA: Diagnosis not present

## 2016-01-07 DIAGNOSIS — K219 Gastro-esophageal reflux disease without esophagitis: Secondary | ICD-10-CM | POA: Diagnosis not present

## 2016-01-07 DIAGNOSIS — M6281 Muscle weakness (generalized): Secondary | ICD-10-CM | POA: Diagnosis not present

## 2016-01-08 DIAGNOSIS — K50019 Crohn's disease of small intestine with unspecified complications: Secondary | ICD-10-CM | POA: Diagnosis not present

## 2016-01-08 DIAGNOSIS — M6281 Muscle weakness (generalized): Secondary | ICD-10-CM | POA: Diagnosis not present

## 2016-01-08 DIAGNOSIS — K219 Gastro-esophageal reflux disease without esophagitis: Secondary | ICD-10-CM | POA: Diagnosis not present

## 2016-01-08 DIAGNOSIS — M170 Bilateral primary osteoarthritis of knee: Secondary | ICD-10-CM | POA: Diagnosis not present

## 2016-01-12 ENCOUNTER — Encounter: Payer: Medicare Other | Admitting: Family Medicine

## 2016-01-12 DIAGNOSIS — M170 Bilateral primary osteoarthritis of knee: Secondary | ICD-10-CM | POA: Diagnosis not present

## 2016-01-12 DIAGNOSIS — K219 Gastro-esophageal reflux disease without esophagitis: Secondary | ICD-10-CM | POA: Diagnosis not present

## 2016-01-12 DIAGNOSIS — M6281 Muscle weakness (generalized): Secondary | ICD-10-CM | POA: Diagnosis not present

## 2016-01-12 DIAGNOSIS — K50019 Crohn's disease of small intestine with unspecified complications: Secondary | ICD-10-CM | POA: Diagnosis not present

## 2016-01-13 ENCOUNTER — Telehealth: Payer: Self-pay | Admitting: Family Medicine

## 2016-01-13 DIAGNOSIS — K219 Gastro-esophageal reflux disease without esophagitis: Secondary | ICD-10-CM | POA: Diagnosis not present

## 2016-01-13 DIAGNOSIS — M170 Bilateral primary osteoarthritis of knee: Secondary | ICD-10-CM | POA: Diagnosis not present

## 2016-01-13 DIAGNOSIS — M6281 Muscle weakness (generalized): Secondary | ICD-10-CM | POA: Diagnosis not present

## 2016-01-13 DIAGNOSIS — K50019 Crohn's disease of small intestine with unspecified complications: Secondary | ICD-10-CM | POA: Diagnosis not present

## 2016-01-13 NOTE — Telephone Encounter (Signed)
Ellen Peterson (not Caren Griffins) with [facility] was given verbal order

## 2016-01-13 NOTE — Telephone Encounter (Signed)
Caren Griffins @ [facility] called to let you know  The family is requesting home health aid twice weekly Is this ok

## 2016-01-13 NOTE — Telephone Encounter (Signed)
Yes-please verbally ok that order Thanks

## 2016-01-14 DIAGNOSIS — M170 Bilateral primary osteoarthritis of knee: Secondary | ICD-10-CM | POA: Diagnosis not present

## 2016-01-14 DIAGNOSIS — K50019 Crohn's disease of small intestine with unspecified complications: Secondary | ICD-10-CM | POA: Diagnosis not present

## 2016-01-14 DIAGNOSIS — M6281 Muscle weakness (generalized): Secondary | ICD-10-CM | POA: Diagnosis not present

## 2016-01-14 DIAGNOSIS — K219 Gastro-esophageal reflux disease without esophagitis: Secondary | ICD-10-CM | POA: Diagnosis not present

## 2016-01-15 DIAGNOSIS — K50019 Crohn's disease of small intestine with unspecified complications: Secondary | ICD-10-CM | POA: Diagnosis not present

## 2016-01-15 DIAGNOSIS — M170 Bilateral primary osteoarthritis of knee: Secondary | ICD-10-CM | POA: Diagnosis not present

## 2016-01-15 DIAGNOSIS — K219 Gastro-esophageal reflux disease without esophagitis: Secondary | ICD-10-CM | POA: Diagnosis not present

## 2016-01-15 DIAGNOSIS — M6281 Muscle weakness (generalized): Secondary | ICD-10-CM | POA: Diagnosis not present

## 2016-01-18 ENCOUNTER — Other Ambulatory Visit: Payer: Self-pay | Admitting: Family Medicine

## 2016-01-18 DIAGNOSIS — M6281 Muscle weakness (generalized): Secondary | ICD-10-CM | POA: Diagnosis not present

## 2016-01-18 DIAGNOSIS — K219 Gastro-esophageal reflux disease without esophagitis: Secondary | ICD-10-CM | POA: Diagnosis not present

## 2016-01-18 DIAGNOSIS — M170 Bilateral primary osteoarthritis of knee: Secondary | ICD-10-CM | POA: Diagnosis not present

## 2016-01-18 DIAGNOSIS — K50019 Crohn's disease of small intestine with unspecified complications: Secondary | ICD-10-CM | POA: Diagnosis not present

## 2016-01-18 NOTE — Telephone Encounter (Signed)
Px written for call in   

## 2016-01-18 NOTE — Telephone Encounter (Signed)
Rx called in as prescribed 

## 2016-01-18 NOTE — Telephone Encounter (Signed)
Electronic refill request, pt has CPE scheduled on 03/11/16, last refilled on 12/03/15 #90 with 0 refills, please advise

## 2016-01-19 ENCOUNTER — Telehealth: Payer: Self-pay | Admitting: Cardiology

## 2016-01-19 DIAGNOSIS — M170 Bilateral primary osteoarthritis of knee: Secondary | ICD-10-CM | POA: Diagnosis not present

## 2016-01-19 DIAGNOSIS — K50019 Crohn's disease of small intestine with unspecified complications: Secondary | ICD-10-CM | POA: Diagnosis not present

## 2016-01-19 DIAGNOSIS — K219 Gastro-esophageal reflux disease without esophagitis: Secondary | ICD-10-CM | POA: Diagnosis not present

## 2016-01-19 DIAGNOSIS — M6281 Muscle weakness (generalized): Secondary | ICD-10-CM | POA: Diagnosis not present

## 2016-01-19 NOTE — Telephone Encounter (Signed)
Pts daughter (on [place]), just wanted to call and inform Dr Meda Coffee that the pt has bruising to bilateral lower extremities.  Pts daughter states that the pt has been on Xarelto 15 mg po daily, for quite some time now.  Dr Ron Parker started this pt on Xarelto for hx of PE.  Per the daughter, the pt started attending PT/OT, and since this activity has occurred, the pt has noted easier bruising to lower extremities.  Per the daughter, the pt complains of no pain in bilateral extremities.  Per the daughter, she states the pt has bruising noted behind one knee and some on the sides of her legs.  Daughter states that its not a big accumulation of blood, like a hematoma, just very minimal bruising.  Pts daughter states that the pts lower extremities are still warm-to-touch, not hot or cold.  She states that pt has no redness, swelling, or streaking noted to her lower extremities.   Per the daughter, the pt complains of no pain in bilateral extremities, even when asked to point her toes down and up. No calf pain present.  Per the daughter, the pt has no cardiac complaints at all, she just wanted to run this by Dr Meda Coffee to make her aware of the bruising.  Informed the daughter that Dr Meda Coffee is currently out of the office the rest of the day, but I will route this message to her for further review and recommendation and follow-up with her thereafter.  Daughter verbalized understanding and agrees with this plan.

## 2016-01-19 NOTE — Telephone Encounter (Signed)
New Prob   Daughter states pt has a bruise to both lower extremities. Pt is on Xarelto and daughter is concerned. Please call.

## 2016-01-20 DIAGNOSIS — K219 Gastro-esophageal reflux disease without esophagitis: Secondary | ICD-10-CM | POA: Diagnosis not present

## 2016-01-20 DIAGNOSIS — M170 Bilateral primary osteoarthritis of knee: Secondary | ICD-10-CM | POA: Diagnosis not present

## 2016-01-20 DIAGNOSIS — M6281 Muscle weakness (generalized): Secondary | ICD-10-CM | POA: Diagnosis not present

## 2016-01-20 DIAGNOSIS — K50019 Crohn's disease of small intestine with unspecified complications: Secondary | ICD-10-CM | POA: Diagnosis not present

## 2016-01-20 NOTE — Telephone Encounter (Signed)
I would continue xarelto for now and would discontinue if her bruising gets worse or she develops bleeding.

## 2016-01-20 NOTE — Telephone Encounter (Signed)
Notified the pts daughter that per Dr Meda Coffee, she recommends that the pt continue her Xarelto for now, and would discontinue if her bruising gets worse or she develops bleeding.  Daughter (on [place]) verbalized understanding and agrees with this plan.

## 2016-01-20 NOTE — Telephone Encounter (Signed)
Please call her,she said Dr Meda Coffee talked to her sister yesterday. Her sister was not aware what was going on. She would like to know what Dr Meda Coffee said,sinceshe take care of the patient.

## 2016-01-21 DIAGNOSIS — K50019 Crohn's disease of small intestine with unspecified complications: Secondary | ICD-10-CM | POA: Diagnosis not present

## 2016-01-21 DIAGNOSIS — M6281 Muscle weakness (generalized): Secondary | ICD-10-CM | POA: Diagnosis not present

## 2016-01-21 DIAGNOSIS — M170 Bilateral primary osteoarthritis of knee: Secondary | ICD-10-CM | POA: Diagnosis not present

## 2016-01-21 DIAGNOSIS — K219 Gastro-esophageal reflux disease without esophagitis: Secondary | ICD-10-CM | POA: Diagnosis not present

## 2016-01-22 ENCOUNTER — Telehealth: Payer: Self-pay | Admitting: Family Medicine

## 2016-01-22 DIAGNOSIS — M6281 Muscle weakness (generalized): Secondary | ICD-10-CM | POA: Diagnosis not present

## 2016-01-22 DIAGNOSIS — K529 Noninfective gastroenteritis and colitis, unspecified: Secondary | ICD-10-CM | POA: Diagnosis not present

## 2016-01-22 NOTE — Telephone Encounter (Signed)
Pt's daughter dropped off ppw to be filled out from the [place]. You can reach Vaughan Basta (daughter) back at [phone]. Placing ppw in Dr. Marliss Coots rx slot.

## 2016-01-25 DIAGNOSIS — M6281 Muscle weakness (generalized): Secondary | ICD-10-CM | POA: Diagnosis not present

## 2016-01-25 DIAGNOSIS — M170 Bilateral primary osteoarthritis of knee: Secondary | ICD-10-CM | POA: Diagnosis not present

## 2016-01-25 DIAGNOSIS — K50019 Crohn's disease of small intestine with unspecified complications: Secondary | ICD-10-CM | POA: Diagnosis not present

## 2016-01-25 DIAGNOSIS — N3020 Other chronic cystitis without hematuria: Secondary | ICD-10-CM | POA: Diagnosis not present

## 2016-01-25 DIAGNOSIS — K219 Gastro-esophageal reflux disease without esophagitis: Secondary | ICD-10-CM | POA: Diagnosis not present

## 2016-01-25 DIAGNOSIS — Z0000 Encounter for general adult medical examination without abnormal findings: Secondary | ICD-10-CM | POA: Diagnosis not present

## 2016-01-25 NOTE — Telephone Encounter (Signed)
Form is much more in depth than I can do without seeing her - requires face to face mobility exam= and description of limitation of each limb/etc  Please f/u when able  I will keep form until then Thanks

## 2016-01-25 NOTE — Telephone Encounter (Signed)
Daughter notified of Dr. Marliss Coots comments and f/u appt scheduled

## 2016-01-26 ENCOUNTER — Ambulatory Visit (INDEPENDENT_AMBULATORY_CARE_PROVIDER_SITE_OTHER): Payer: Medicare Other | Admitting: Sports Medicine

## 2016-01-26 ENCOUNTER — Encounter: Payer: Self-pay | Admitting: Sports Medicine

## 2016-01-26 DIAGNOSIS — M6281 Muscle weakness (generalized): Secondary | ICD-10-CM | POA: Diagnosis not present

## 2016-01-26 DIAGNOSIS — K50019 Crohn's disease of small intestine with unspecified complications: Secondary | ICD-10-CM | POA: Diagnosis not present

## 2016-01-26 DIAGNOSIS — M79676 Pain in unspecified toe(s): Secondary | ICD-10-CM | POA: Diagnosis not present

## 2016-01-26 DIAGNOSIS — B351 Tinea unguium: Secondary | ICD-10-CM

## 2016-01-26 DIAGNOSIS — K219 Gastro-esophageal reflux disease without esophagitis: Secondary | ICD-10-CM | POA: Diagnosis not present

## 2016-01-26 DIAGNOSIS — M170 Bilateral primary osteoarthritis of knee: Secondary | ICD-10-CM | POA: Diagnosis not present

## 2016-01-26 DIAGNOSIS — I739 Peripheral vascular disease, unspecified: Secondary | ICD-10-CM

## 2016-01-26 NOTE — Progress Notes (Signed)
Patient ID: Ellen Peterson, female   DOB: [birth year], 82 y.o.   MRN: [id number]  Subjective: Ellen Peterson is a 82 y.o. female patient with history of PVD who returnts to office today complaining of long, painful nails  while ambulating in shoes unable to trim herself.  Patient denies any new changes in medication or new problems. Patient denies any constitutional symptoms or any other pedal complaints at this time.  Patient is assisted by daughter who states that mom was hospitalized for pneuoma and since has been in rehab and now has home PT.   Patient Active Problem List   Diagnosis Date Noted  . CAP (community acquired pneumonia) 01/02/2016  . Regional enteritis of jejunum ([place]) 11/27/2015  . CKD (chronic kidney disease), stage III 11/27/2015  . Chronic diastolic heart failure ([place]) 11/27/2015  . Diabetes mellitus type 2, diet-controlled ([place]) 11/27/2015  . Abdominal pain, left lower quadrant 11/11/2015  . Left groin pain 11/11/2015  . Shortness of breath 08/20/2014  . History of DVT (deep vein thrombosis) 07/23/2014  . CAD (coronary artery disease), native coronary artery 07/23/2014  . Pulmonary hypertension ([place]) 07/23/2014  . Right ventricular dysfunction 07/23/2014  . HX: anticoagulation 07/23/2014  . Dysphasia 07/23/2014  . Ejection fraction   . Essential hypertension, benign 06/22/2014  . UI (urinary incontinence) 06/22/2014  . GERD (gastroesophageal reflux disease) 06/22/2014  . Physical deconditioning 06/22/2014  . History of pulmonary embolism 06/15/2014  . Encounter for Medicare annual wellness exam 08/08/2013  . LBBB (left bundle branch block)   . Hyperlipidemia   . Wide-complex tachycardia ([place])   . Venous insufficiency 06/03/2011  . Hyperglycemia 11/17/2010  . Anemia 02/08/2010  . Renal insufficiency 10/27/2009  . Recurrent UTI 09/22/2008  . Vitamin D deficiency 09/10/2008  . COLONIC POLYPS, ADENOMATOUS 04/25/2007  . PERIPHERAL VASCULAR DISEASE 04/25/2007  .  DIVERTICULOSIS, COLON 04/25/2007  . IBS 04/25/2007  . Osteoarthritis 04/25/2007  . Polymyalgia rheumatica ([place]) 04/25/2007   Current Outpatient Prescriptions on File Prior to Visit  Medication Sig Dispense Refill  . acetaminophen (TYLENOL) 500 MG tablet Take 500 mg by mouth every 6 (six) hours as needed (pain).     . cholecalciferol (VITAMIN D) 1000 UNITS tablet Take 1,000 Units by mouth daily.      Marland Kitchen CINNAMON PO Take 1,000 mg by mouth daily.    . diclofenac sodium (VOLTAREN) 1 % GEL Apply 2 g topically 2 (two) times daily as needed (knee pain). 6 Tube 3  . hydrochlorothiazide (HYDRODIURIL) 25 MG tablet Take 25 mg by mouth daily.     . isosorbide mononitrate (IMDUR) 30 MG 24 hr tablet TAKE 1 TABLET BY MOUTH DAILY (Patient taking differently: TAKE 0.5 TABLET (15 mg) BY MOUTH BID) 30 tablet 11  . losartan (COZAAR) 50 MG tablet Take 50 mg by mouth daily.    . metoprolol tartrate (LOPRESSOR) 25 MG tablet TAKE 1 TABLET BY MOUTH TWICE A DAY 60 tablet 11  . Multiple Vitamins-Minerals (ICAPS PO) Take 1 capsule by mouth daily.    . Multiple Vitamins-Minerals (MULTIVITAMIN & MINERAL PO) Take 1 tablet by mouth daily.    . nitroGLYCERIN (NITROSTAT) 0.4 MG SL tablet Place 1 tablet (0.4 mg total) under the tongue every 5 (five) minutes as needed for chest pain. As directed and as needed. 10 tablet 3  . Omega-3 Fatty Acids (FISH OIL) 1000 MG CAPS Take 1,000 mg by mouth daily.    . pantoprazole (PROTONIX) 40 MG tablet Take 1 tablet (40 mg total)  by mouth 2 (two) times daily. 180 tablet 3  . rivaroxaban (XARELTO) 15 MG TABS tablet Take 1 tablet (15 mg total) by mouth daily with supper. Starting 07/08/14 30 tablet 6  . traMADol (ULTRAM) 50 MG tablet TAKE 1 TABLET EVERY EIGHT HOURS AS NEEDED FOR PAIN 90 tablet 0  . valsartan (DIOVAN) 80 MG tablet Take 80 mg by mouth daily.     . vitamin E 400 UNIT capsule Take 400 Units by mouth daily.       No current facility-administered medications on file prior to visit.    Allergies  Allergen Reactions  . Metronidazole Hives  . Nitrofurantoin Hives  . Nsaids Other (See Comments)    REACTION: GI symptoms  . Rofecoxib Other (See Comments)    REACTION: GI symptoms  . Simvastatin Other (See Comments)    REACTION: myalgia  . Erythromycin Rash  . Metformin Diarrhea and Nausea And Vomiting  . Sulfamethoxazole-Trimethoprim Itching  . Tetracycline Rash    Objective: General: Patient is awake, alert, and oriented x 3 and in no acute distress.  Integument: Skin is warm, dry and supple bilateral. Nails are long, thickened and  dystrophic with subungual debris, consistent with onychomycosis, 1-5 bilateral with no signs of infection. No open lesions or preulcerative lesions present bilateral.   Vasculature:  Dorsalis Pedis pulse 1/4 bilateral. Posterior Tibial pulse  0/4 bilateral.  Capillary fill time <3 sec 1-5 bilateral.Diminished hair growth to the level of the digits. Temperature gradient within normal limits. Varicosities present bilateral. Mild trace edema present bilateral. No pain with calf compression, swelling, warmth or erythema.  Neurology: The patient has intact sensation measured with a 5.07/10g Semmes Weinstein Monofilament at all pedal sites bilateral . Vibratory sensation slightly diminished bilateral with tuning fork. No Babinski sign present bilateral.   Musculoskeletal: No gross pedal deformities noted bilateral. Muscular strength 5/5 in all lower extremity muscular groups bilateral.  Assessment and Plan: Problem List Items Addressed This Visit    None    Visit Diagnoses    Dermatophytosis of nail    -  Primary    Pain of toe, unspecified laterality        PVD (peripheral vascular disease) ([place])          -Examined patient. -Discussed and educated patient on foot care, especially with  regards to the vascular, neurological and muschloskeletal systems.  -Mechanically debrided all nails 1-5 bilateral using sterile nail nipper without  incident  -Answered all patient questions -Patient to return as needed or in 3 months for foot care -Patient advised to call the office if any problems or questions arise in the meantime.  Landis Martins, DPM

## 2016-01-27 ENCOUNTER — Encounter: Payer: Self-pay | Admitting: Family Medicine

## 2016-01-27 ENCOUNTER — Ambulatory Visit (INDEPENDENT_AMBULATORY_CARE_PROVIDER_SITE_OTHER): Payer: Medicare Other | Admitting: Family Medicine

## 2016-01-27 VITALS — BP 124/78 | HR 60 | Temp 97.9°F | Ht 60.0 in | Wt 152.8 lb

## 2016-01-27 DIAGNOSIS — I272 Other secondary pulmonary hypertension: Secondary | ICD-10-CM | POA: Diagnosis not present

## 2016-01-27 DIAGNOSIS — Z7409 Other reduced mobility: Secondary | ICD-10-CM | POA: Diagnosis not present

## 2016-01-27 DIAGNOSIS — M170 Bilateral primary osteoarthritis of knee: Secondary | ICD-10-CM

## 2016-01-27 DIAGNOSIS — I5032 Chronic diastolic (congestive) heart failure: Secondary | ICD-10-CM

## 2016-01-27 DIAGNOSIS — R4189 Other symptoms and signs involving cognitive functions and awareness: Secondary | ICD-10-CM

## 2016-01-27 MED ORDER — HYDROCHLOROTHIAZIDE 25 MG PO TABS: 25.0000 mg | ORAL_TABLET | Freq: Every day | ORAL | Status: DC

## 2016-01-27 MED ORDER — DICLOFENAC SODIUM 1 % TD GEL: 2.0000 g | Freq: Two times a day (BID) | TRANSDERMAL | Status: DC | PRN

## 2016-01-27 MED ORDER — LOSARTAN POTASSIUM 50 MG PO TABS: 50.0000 mg | ORAL_TABLET | Freq: Every day | ORAL | Status: DC

## 2016-01-27 MED ORDER — PANTOPRAZOLE SODIUM 40 MG PO TBEC: 40.0000 mg | DELAYED_RELEASE_TABLET | Freq: Two times a day (BID) | ORAL | Status: AC

## 2016-01-27 NOTE — Progress Notes (Signed)
Pre visit review using our clinic review tool, if applicable. No additional management support is needed unless otherwise documented below in the visit note. 

## 2016-01-27 NOTE — Patient Instructions (Signed)
Thanks for coming in  Medicines refilled  I will work on your paperwork

## 2016-01-27 NOTE — Progress Notes (Signed)
Subjective:    Patient ID: Ellen Peterson, female    DOB: 1920/01/28, 82 y.o.   MRN: [id number]  HPI Here for face to face eval to get help from [place] - trying to get assistance financially to help for home care with ADLs   PT and OT are still coming out for 3 more weeks   Assistance with bathing and showering  Needs help getting in tub to seat- combination of balance and strength loss  Cannot reach back and below the waist to clean and also cannot wash hair  Weakness and shortness of breath are the major issues   assit with mobility at all times - has to use walker at all times  Takes a while to get up with a chair with arms to get to walker - sometimes needs human assistance to get up -not always  Due to weakness- needs help to get into bed (cannot do it alone)  Needs personal help doing hair Can brush teeth - but needs someone to hold on to her since she is not holding walker   Need protected env to prevent falls  Did fall out of bed once -now got a side rail   Causes - advanced age, generalized weakness and poor balance  Sob - from pulm HTN and heart failure and copd (from pulm emboli)   Lack of rom of legs- due to OA of knees - bone on bone - prevents standing as well   Family has to dose her medications  Cannot do her own finances  From slowed cognition (memory is ok however)   Posture - is hunched to hold on to something Generally frail appearing   Can raise her both arms to 90 deg before pain and grip is limited by arthritis -can hold walker for short periods -and can no longer crochet   Legs - both generally weak but now worse on the Left  Ankle movement is unrestricted  Knee movement is very restricted with pain from oa  L hip restriction -due to OA /pain  Longest she can stand with her walker 5 min or less - due to fatigue and pain  Balance is generally poor - cannot stand alone for 10 seconds   Neck -fairly nl rom  Upper back - fairly nl rom  Low back -  chronic pain with restriction in forward flex to 90 deg , extension   Incontinent of urine totally - dribbles all day - goes some in the toilet Always has to wear an undergarment  Continent of stool most of the time - occasionally cannot get to the bathroom in time   Is able to go to the doctor -with assistance - 0-3 times per week-with assistance of 2 people  Does not go anywhere else   Patient Active Problem List   Diagnosis Date Noted  . Mobility impaired 01/28/2016  . Osteoarthritis of both knees 01/28/2016  . Cognitive decline 01/28/2016  . CAP (community acquired pneumonia) 01/02/2016  . Regional enteritis of jejunum ([place]) 11/27/2015  . CKD (chronic kidney disease), stage III 11/27/2015  . Chronic diastolic heart failure ([place]) 11/27/2015  . Diabetes mellitus type 2, diet-controlled ([place]) 11/27/2015  . Abdominal pain, left lower quadrant 11/11/2015  . Left groin pain 11/11/2015  . Shortness of breath 08/20/2014  . History of DVT (deep vein thrombosis) 07/23/2014  . CAD (coronary artery disease), native coronary artery 07/23/2014  . Pulmonary hypertension ([place]) 07/23/2014  . Right ventricular dysfunction 07/23/2014  .  HX: anticoagulation 07/23/2014  . Dysphasia 07/23/2014  . Ejection fraction   . Essential hypertension, benign 06/22/2014  . UI (urinary incontinence) 06/22/2014  . GERD (gastroesophageal reflux disease) 06/22/2014  . Physical deconditioning 06/22/2014  . History of pulmonary embolism 06/15/2014  . Encounter for Medicare annual wellness exam 08/08/2013  . LBBB (left bundle branch block)   . Hyperlipidemia   . Wide-complex tachycardia ([place])   . Venous insufficiency 06/03/2011  . Hyperglycemia 11/17/2010  . Anemia 02/08/2010  . Renal insufficiency 10/27/2009  . Recurrent UTI 09/22/2008  . Vitamin D deficiency 09/10/2008  . COLONIC POLYPS, ADENOMATOUS 04/25/2007  . PERIPHERAL VASCULAR DISEASE 04/25/2007  . DIVERTICULOSIS, COLON 04/25/2007  . IBS  04/25/2007  . Osteoarthritis 04/25/2007  . Polymyalgia rheumatica ([place]) 04/25/2007   Past Medical History  Diagnosis Date  . Diabetes mellitus, type 2 ([place])   . Diverticulosis of colon   . GERD (gastroesophageal reflux disease)   . Hyperlipidemia   . Hypertension   . OA (osteoarthritis)   . Peripheral vascular disease ([place])   . PMR (polymyalgia rheumatica) ([facility])   . Frequent UTI   . Chest tightness     catheterization, 2005, normal coronaries  . Wide-complex tachycardia ([facility])     SVT in past  . Hypokalemia   . LBBB (left bundle branch block)     Since at least 2005  . Ejection fraction < 50%     EF 40% echo, 09/2009  . Complication of anesthesia   . PONV (postoperative nausea and vomiting)   . PE (pulmonary embolism) 06/16/2014  . Shortness of breath   . Recurrent UTI   . Enteritis 11/2015   Past Surgical History  Procedure Laterality Date  . Appendectomy  1993  . Cholecystectomy    . Colectomy  1993    partial  . Total abdominal hysterectomy  1960s    fibroids   . Vein ligation and stripping    . Bladder tack up    . Back surgery    . Esophagogastroduodenoscopy  05/2004    baretts esophagus, stomach polyps  . Bilateral pyelonephritis    . Cardiac catheterization  2005    no significant CAD  . Tonsillectomy    . Left heart catheterization with coronary angiogram N/A 06/12/2014    Procedure: LEFT HEART CATHETERIZATION WITH CORONARY ANGIOGRAM;  Surgeon: Peter M Martinique, MD;  Location: [facility] CATH LAB;  Service: Cardiovascular;  Laterality: N/A;  . [place]  . Enteroscopy N/A 11/29/2015    Procedure: ENTEROSCOPY;  Surgeon: Ladene Artist, MD;  Location[facility] ENDOSCOPY;  Service: Endoscopy;  Laterality: N/A;   Social History  Substance Use Topics  . Smoking status: Never Smoker   . Smokeless tobacco: Never Used  . Alcohol Use: No   Family History  Problem Relation Age of Onset  . CAD Mother   . Hypertension Mother   . Stroke Mother   . Pancreatic cancer  Sister   . Diabetes Brother   . Heart Problems Brother   . Heart attack Mother   . Heart attack Brother    Allergies  Allergen Reactions  . Metronidazole Hives  . Nitrofurantoin Hives  . Nsaids Other (See Comments)    REACTION: GI symptoms  . Rofecoxib Other (See Comments)    REACTION: GI symptoms  . Simvastatin Other (See Comments)    REACTION: myalgia  . Erythromycin Rash  . Metformin Diarrhea and Nausea And Vomiting  . Sulfamethoxazole-Trimethoprim Itching  . Tetracycline Rash  Current Outpatient Prescriptions on File Prior to Visit  Medication Sig Dispense Refill  . acetaminophen (TYLENOL) 500 MG tablet Take 500 mg by mouth every 6 (six) hours as needed (pain).     . cholecalciferol (VITAMIN D) 1000 UNITS tablet Take 1,000 Units by mouth daily.      Marland Kitchen CINNAMON PO Take 1,000 mg by mouth daily.    . isosorbide mononitrate (IMDUR) 30 MG 24 hr tablet TAKE 1 TABLET BY MOUTH DAILY (Patient taking differently: TAKE 0.5 TABLET (15 mg) BY MOUTH BID) 30 tablet 11  . metoprolol tartrate (LOPRESSOR) 25 MG tablet TAKE 1 TABLET BY MOUTH TWICE A DAY 60 tablet 11  . Multiple Vitamins-Minerals (ICAPS PO) Take 1 capsule by mouth daily.    . Multiple Vitamins-Minerals (MULTIVITAMIN & MINERAL PO) Take 1 tablet by mouth daily.    . nitroGLYCERIN (NITROSTAT) 0.4 MG SL tablet Place 1 tablet (0.4 mg total) under the tongue every 5 (five) minutes as needed for chest pain. As directed and as needed. 10 tablet 3  . Omega-3 Fatty Acids (FISH OIL) 1000 MG CAPS Take 1,000 mg by mouth daily.    . rivaroxaban (XARELTO) 15 MG TABS tablet Take 1 tablet (15 mg total) by mouth daily with supper. Starting 07/08/14 30 tablet 6  . traMADol (ULTRAM) 50 MG tablet TAKE 1 TABLET EVERY EIGHT HOURS AS NEEDED FOR PAIN 90 tablet 0  . vitamin E 400 UNIT capsule Take 400 Units by mouth daily.       No current facility-administered medications on file prior to visit.    Review of Systems Review of Systems  Constitutional:  Negative for fever, appetite change,  and unexpected weight change. pos for gen weakness and fatigue  Eyes: Negative for pain and visual disturbance.  Respiratory: Negative for cough and pos for shortness of breath.   Cardiovascular: Negative for cp or palpitations    Gastrointestinal: Negative for nausea, diarrhea and constipation.  Genitourinary: pos for urgency and frequency. pos for urine incontinence and occ fecal incontinence  Skin: Negative for pallor or rash   Neurological: Negative for weakness, light-headedness, numbness and headaches.  Hematological: Negative for adenopathy. Does not bruise/bleed easily.  Psychiatric/Behavioral: Negative for dysphoric mood. The patient is not nervous/anxious. Pos for slowed cognition        Objective:   Physical Exam  Constitutional: She appears well-developed and well-nourished. No distress.  Frail appearing obese female   HENT:  Head: Normocephalic and atraumatic.  Mouth/Throat: Oropharynx is clear and moist.  Eyes: Conjunctivae and EOM are normal. Pupils are equal, round, and reactive to light.  Neck: Normal range of motion. Neck supple. No JVD present. Carotid bruit is not present. No thyromegaly present.  Cardiovascular: Normal rate, regular rhythm, normal heart sounds and intact distal pulses.  Exam reveals no gallop.   Pulmonary/Chest: Effort normal and breath sounds normal. No respiratory distress. She has no wheezes. She has no rales.  No crackles  Short of breath with little exertion - even with standing from chair or bending forward   Abdominal: Soft. Bowel sounds are normal. She exhibits no distension, no abdominal bruit and no mass. There is no tenderness.  Musculoskeletal: She exhibits no edema.  Limited rom of knees due to OA  OA changes in hands with tender joints   Limited flex and ext of TS and LS   Lymphadenopathy:    She has no cervical adenopathy.  Neurological: She is alert. She has normal reflexes. She displays no  atrophy.  No cranial nerve deficit or sensory deficit. She exhibits normal muscle tone. Coordination normal.  Gait is slow and wide based with walker  Can take 3 steps with assistance Cannot rise from chair without assistance  Fails get up and go test   Strength is 4/5 for L arm flexors and grip as well as R foot dorsiflexion  Otherwise full and symmetric  Skin: Skin is warm and dry. No rash noted.  Psychiatric: She has a normal mood and affect. Her mood appears not anxious. Her speech is delayed. She is slowed. Thought content is not paranoid. She does not exhibit a depressed mood. She expresses no homicidal and no suicidal ideation.  Speech is mildly delayed but not slurred           Assessment & Plan:   Problem List Items Addressed This Visit      Cardiovascular and Mediastinum   Chronic diastolic heart failure ([place])    With pulm HTN Sob with small amt of exertion  req help with ADLs Continue cardiology f/u      Relevant Medications   hydrochlorothiazide (HYDRODIURIL) 25 MG tablet   losartan (COZAAR) 50 MG tablet   Pulmonary hypertension ([facility]) - Primary    Pt has sob with even small amt of exertion  Continues PT  She requires help with ADLs      Relevant Medications   hydrochlorothiazide (HYDRODIURIL) 25 MG tablet   losartan (COZAAR) 50 MG tablet     Musculoskeletal and Integument   Osteoarthritis of both knees    With poor rom and limited mobility Requires help with ADLs Can walk short distances with walker and assistance         Other   Cognitive decline    Thinking is slowed but memory is fairly well preserved  Requires help with calculations and finances and some ADLs This adds to home bound status      Mobility impaired    Abilities outlined in HPI Forms filled out for [employer] assistance  Needs help with ADLs  Has general weakness and OA pain  Also sob with little exertion due to CHF and pulm HTN This all adds to her home bound status

## 2016-01-28 DIAGNOSIS — Z7409 Other reduced mobility: Secondary | ICD-10-CM | POA: Insufficient documentation

## 2016-01-28 DIAGNOSIS — M170 Bilateral primary osteoarthritis of knee: Secondary | ICD-10-CM | POA: Diagnosis not present

## 2016-01-28 DIAGNOSIS — K219 Gastro-esophageal reflux disease without esophagitis: Secondary | ICD-10-CM | POA: Diagnosis not present

## 2016-01-28 DIAGNOSIS — R4189 Other symptoms and signs involving cognitive functions and awareness: Secondary | ICD-10-CM | POA: Insufficient documentation

## 2016-01-28 DIAGNOSIS — K50019 Crohn's disease of small intestine with unspecified complications: Secondary | ICD-10-CM | POA: Diagnosis not present

## 2016-01-28 DIAGNOSIS — M6281 Muscle weakness (generalized): Secondary | ICD-10-CM | POA: Diagnosis not present

## 2016-01-28 NOTE — Assessment & Plan Note (Signed)
Pt has sob with even small amt of exertion  Continues PT  She requires help with ADLs

## 2016-01-28 NOTE — Assessment & Plan Note (Signed)
With pulm HTN Sob with small amt of exertion  req help with ADLs Continue cardiology f/u

## 2016-01-28 NOTE — Assessment & Plan Note (Signed)
With poor rom and limited mobility Requires help with ADLs Can walk short distances with walker and assistance

## 2016-01-28 NOTE — Assessment & Plan Note (Addendum)
Thinking is slowed but memory is fairly well preserved  Requires help with calculations and finances and some ADLs This adds to home bound status

## 2016-01-28 NOTE — Assessment & Plan Note (Addendum)
Abilities outlined in HPI Forms filled out for [employer] assistance  Needs help with ADLs  Has general weakness and OA pain  Also sob with little exertion due to CHF and pulm HTN This all adds to her home bound status

## 2016-01-29 DIAGNOSIS — K219 Gastro-esophageal reflux disease without esophagitis: Secondary | ICD-10-CM | POA: Diagnosis not present

## 2016-01-29 DIAGNOSIS — M170 Bilateral primary osteoarthritis of knee: Secondary | ICD-10-CM | POA: Diagnosis not present

## 2016-01-29 DIAGNOSIS — K50019 Crohn's disease of small intestine with unspecified complications: Secondary | ICD-10-CM | POA: Diagnosis not present

## 2016-01-29 DIAGNOSIS — M6281 Muscle weakness (generalized): Secondary | ICD-10-CM | POA: Diagnosis not present

## 2016-02-01 DIAGNOSIS — M170 Bilateral primary osteoarthritis of knee: Secondary | ICD-10-CM | POA: Diagnosis not present

## 2016-02-01 DIAGNOSIS — K50019 Crohn's disease of small intestine with unspecified complications: Secondary | ICD-10-CM | POA: Diagnosis not present

## 2016-02-01 DIAGNOSIS — K219 Gastro-esophageal reflux disease without esophagitis: Secondary | ICD-10-CM | POA: Diagnosis not present

## 2016-02-01 DIAGNOSIS — M6281 Muscle weakness (generalized): Secondary | ICD-10-CM | POA: Diagnosis not present

## 2016-02-02 DIAGNOSIS — K219 Gastro-esophageal reflux disease without esophagitis: Secondary | ICD-10-CM | POA: Diagnosis not present

## 2016-02-02 DIAGNOSIS — M170 Bilateral primary osteoarthritis of knee: Secondary | ICD-10-CM | POA: Diagnosis not present

## 2016-02-02 DIAGNOSIS — K50019 Crohn's disease of small intestine with unspecified complications: Secondary | ICD-10-CM | POA: Diagnosis not present

## 2016-02-02 DIAGNOSIS — M6281 Muscle weakness (generalized): Secondary | ICD-10-CM | POA: Diagnosis not present

## 2016-02-04 DIAGNOSIS — M170 Bilateral primary osteoarthritis of knee: Secondary | ICD-10-CM | POA: Diagnosis not present

## 2016-02-04 DIAGNOSIS — K50019 Crohn's disease of small intestine with unspecified complications: Secondary | ICD-10-CM | POA: Diagnosis not present

## 2016-02-04 DIAGNOSIS — M6281 Muscle weakness (generalized): Secondary | ICD-10-CM | POA: Diagnosis not present

## 2016-02-04 DIAGNOSIS — K219 Gastro-esophageal reflux disease without esophagitis: Secondary | ICD-10-CM | POA: Diagnosis not present

## 2016-02-08 DIAGNOSIS — K50019 Crohn's disease of small intestine with unspecified complications: Secondary | ICD-10-CM | POA: Diagnosis not present

## 2016-02-08 DIAGNOSIS — K219 Gastro-esophageal reflux disease without esophagitis: Secondary | ICD-10-CM | POA: Diagnosis not present

## 2016-02-08 DIAGNOSIS — M6281 Muscle weakness (generalized): Secondary | ICD-10-CM | POA: Diagnosis not present

## 2016-02-08 DIAGNOSIS — M170 Bilateral primary osteoarthritis of knee: Secondary | ICD-10-CM | POA: Diagnosis not present

## 2016-02-09 DIAGNOSIS — K50019 Crohn's disease of small intestine with unspecified complications: Secondary | ICD-10-CM | POA: Diagnosis not present

## 2016-02-09 DIAGNOSIS — M170 Bilateral primary osteoarthritis of knee: Secondary | ICD-10-CM | POA: Diagnosis not present

## 2016-02-09 DIAGNOSIS — K219 Gastro-esophageal reflux disease without esophagitis: Secondary | ICD-10-CM | POA: Diagnosis not present

## 2016-02-09 DIAGNOSIS — M6281 Muscle weakness (generalized): Secondary | ICD-10-CM | POA: Diagnosis not present

## 2016-02-10 DIAGNOSIS — M170 Bilateral primary osteoarthritis of knee: Secondary | ICD-10-CM | POA: Diagnosis not present

## 2016-02-10 DIAGNOSIS — M6281 Muscle weakness (generalized): Secondary | ICD-10-CM | POA: Diagnosis not present

## 2016-02-10 DIAGNOSIS — K50019 Crohn's disease of small intestine with unspecified complications: Secondary | ICD-10-CM | POA: Diagnosis not present

## 2016-02-10 DIAGNOSIS — K219 Gastro-esophageal reflux disease without esophagitis: Secondary | ICD-10-CM | POA: Diagnosis not present

## 2016-02-11 DIAGNOSIS — K219 Gastro-esophageal reflux disease without esophagitis: Secondary | ICD-10-CM | POA: Diagnosis not present

## 2016-02-11 DIAGNOSIS — K50019 Crohn's disease of small intestine with unspecified complications: Secondary | ICD-10-CM | POA: Diagnosis not present

## 2016-02-11 DIAGNOSIS — M170 Bilateral primary osteoarthritis of knee: Secondary | ICD-10-CM | POA: Diagnosis not present

## 2016-02-11 DIAGNOSIS — M6281 Muscle weakness (generalized): Secondary | ICD-10-CM | POA: Diagnosis not present

## 2016-02-12 ENCOUNTER — Ambulatory Visit (INDEPENDENT_AMBULATORY_CARE_PROVIDER_SITE_OTHER): Payer: Medicare Other | Admitting: Family Medicine

## 2016-02-12 ENCOUNTER — Ambulatory Visit (INDEPENDENT_AMBULATORY_CARE_PROVIDER_SITE_OTHER)
Admission: RE | Admit: 2016-02-12 | Discharge: 2016-02-12 | Disposition: A | Discharge status: Home / Self Care | Payer: Medicare Other | Source: Ambulatory Visit | Attending: Family Medicine | Admitting: Family Medicine

## 2016-02-12 ENCOUNTER — Encounter: Payer: Self-pay | Admitting: Family Medicine

## 2016-02-12 ENCOUNTER — Telehealth: Payer: Self-pay

## 2016-02-12 ENCOUNTER — Other Ambulatory Visit: Payer: Self-pay | Admitting: Family Medicine

## 2016-02-12 VITALS — BP 118/84 | HR 70 | Temp 98.1°F | Ht 60.0 in | Wt 150.5 lb

## 2016-02-12 DIAGNOSIS — R05 Cough: Secondary | ICD-10-CM

## 2016-02-12 DIAGNOSIS — J189 Pneumonia, unspecified organism: Secondary | ICD-10-CM

## 2016-02-12 DIAGNOSIS — R059 Cough, unspecified: Secondary | ICD-10-CM

## 2016-02-12 MED ORDER — LEVOFLOXACIN 500 MG PO TABS: 500.0000 mg | ORAL_TABLET | Freq: Every day | ORAL | Status: DC

## 2016-02-12 NOTE — Progress Notes (Signed)
   Subjective:    Patient ID: Ellen Peterson, female    DOB: 07-28-1920, 82 y.o.   MRN: [id number]  Cough This is a new problem. The current episode started in the past 7 days. The problem has been gradually worsening. The problem occurs constantly. The cough is productive of sputum. Associated symptoms include ear pain, a sore throat and shortness of breath. Pertinent negatives include no fever, headaches, myalgias, nasal congestion or postnasal drip. Associated symptoms comments: Stable SOB.   fatigue. The symptoms are aggravated by lying down. Risk factors: nonsmoker. Treatments tried: tylenol and delsym. The treatment provided mild relief. Her past medical history is significant for COPD. There is no history of asthma, bronchiectasis, bronchitis, environmental allergies or pneumonia.     Social History /Family History/Past Medical History reviewed and updated if needed.  History of pulmonary emboli, DVT, possible COPD per past CXR, nonsmoker She did have pneumonia in 12/25/2015  Review of Systems  Constitutional: Negative for fever.  HENT: Positive for ear pain and sore throat. Negative for postnasal drip.   Respiratory: Positive for cough and shortness of breath.   Musculoskeletal: Negative for myalgias.  Allergic/Immunologic: Negative for environmental allergies.  Neurological: Negative for headaches.       Objective:   Physical Exam  Constitutional: Vital signs are normal. She appears well-developed and well-nourished. She is cooperative.  Non-toxic appearance. She does not appear ill. No distress.  HENT:  Head: Normocephalic.  Right Ear: Hearing, tympanic membrane, external ear and ear canal normal. Tympanic membrane is not erythematous, not retracted and not bulging.  Left Ear: Hearing, tympanic membrane, external ear and ear canal normal. Tympanic membrane is not erythematous, not retracted and not bulging.  Nose: Mucosal edema and rhinorrhea present. Right sinus exhibits no  maxillary sinus tenderness and no frontal sinus tenderness. Left sinus exhibits no maxillary sinus tenderness and no frontal sinus tenderness.  Mouth/Throat: Uvula is midline, oropharynx is clear and moist and mucous membranes are normal.  Eyes: Conjunctivae, EOM and lids are normal. Pupils are equal, round, and reactive to light. Lids are everted and swept, no foreign bodies found.  Neck: Trachea normal and normal range of motion. Neck supple. Carotid bruit is not present. No thyroid mass and no thyromegaly present.  Cardiovascular: Normal rate, regular rhythm, S1 normal, S2 normal, normal heart sounds, intact distal pulses and normal pulses.  Exam reveals no gallop and no friction rub.   No murmur heard. Pulmonary/Chest: Effort normal. No tachypnea. No respiratory distress. She has decreased breath sounds in the right upper field and the right middle field. She has no wheezes. She has no rhonchi. She has no rales.  Neurological: She is alert.  Skin: Skin is warm, dry and intact. No rash noted.  Psychiatric: Her speech is normal and behavior is normal. Judgment normal. Her mood appears not anxious. Cognition and memory are normal. She does not exhibit a depressed mood.          Assessment & Plan:

## 2016-02-12 NOTE — Telephone Encounter (Signed)
Ellen Peterson (daughter) has been notified of chest x-rays.  Levaquin has been sent into [facility].  Follow up appointment has been scheduled with Dr. Glori Bickers for 02/19/2016 at 12:15 pm.

## 2016-02-12 NOTE — Patient Instructions (Addendum)
Call you with the results of X-ray.  For now continue delsym.  Rest, and fluids.  Go to ER if severe shortness of breath.

## 2016-02-12 NOTE — Telephone Encounter (Signed)
[facility] called report CXR. Results taken to Dr Diona Browner. Pt is not waiting.

## 2016-02-12 NOTE — Addendum Note (Signed)
Addended by: Ellamae Sia on: 02/12/2016 12:10 PM   Modules accepted: Orders

## 2016-02-12 NOTE — Assessment & Plan Note (Signed)
Concerning for PNA given decrease breaths ound on right ( where PNA in 12/2105 was) versus chronic lung changes from past lung issues. No clear indications for admission, pt not hypoxic and comfortable .  Will check CXR and call with results.

## 2016-02-12 NOTE — Progress Notes (Signed)
Pre visit review using our clinic review tool, if applicable. No additional management support is needed unless otherwise documented below in the visit note. 

## 2016-02-15 DIAGNOSIS — I272 Other secondary pulmonary hypertension: Secondary | ICD-10-CM | POA: Diagnosis not present

## 2016-02-15 DIAGNOSIS — K449 Diaphragmatic hernia without obstruction or gangrene: Secondary | ICD-10-CM | POA: Diagnosis not present

## 2016-02-15 DIAGNOSIS — K5730 Diverticulosis of large intestine without perforation or abscess without bleeding: Secondary | ICD-10-CM | POA: Diagnosis not present

## 2016-02-15 DIAGNOSIS — K50019 Crohn's disease of small intestine with unspecified complications: Secondary | ICD-10-CM | POA: Diagnosis not present

## 2016-02-15 DIAGNOSIS — I130 Hypertensive heart and chronic kidney disease with heart failure and stage 1 through stage 4 chronic kidney disease, or unspecified chronic kidney disease: Secondary | ICD-10-CM | POA: Diagnosis not present

## 2016-02-15 DIAGNOSIS — E1122 Type 2 diabetes mellitus with diabetic chronic kidney disease: Secondary | ICD-10-CM | POA: Diagnosis not present

## 2016-02-15 DIAGNOSIS — M170 Bilateral primary osteoarthritis of knee: Secondary | ICD-10-CM | POA: Diagnosis not present

## 2016-02-15 DIAGNOSIS — K219 Gastro-esophageal reflux disease without esophagitis: Secondary | ICD-10-CM | POA: Diagnosis not present

## 2016-02-15 DIAGNOSIS — M6281 Muscle weakness (generalized): Secondary | ICD-10-CM | POA: Diagnosis not present

## 2016-02-15 DIAGNOSIS — I5032 Chronic diastolic (congestive) heart failure: Secondary | ICD-10-CM | POA: Diagnosis not present

## 2016-02-15 DIAGNOSIS — I2510 Atherosclerotic heart disease of native coronary artery without angina pectoris: Secondary | ICD-10-CM | POA: Diagnosis not present

## 2016-02-15 DIAGNOSIS — N183 Chronic kidney disease, stage 3 (moderate): Secondary | ICD-10-CM | POA: Diagnosis not present

## 2016-02-15 DIAGNOSIS — E785 Hyperlipidemia, unspecified: Secondary | ICD-10-CM | POA: Diagnosis not present

## 2016-02-16 DIAGNOSIS — I130 Hypertensive heart and chronic kidney disease with heart failure and stage 1 through stage 4 chronic kidney disease, or unspecified chronic kidney disease: Secondary | ICD-10-CM | POA: Diagnosis not present

## 2016-02-16 DIAGNOSIS — K449 Diaphragmatic hernia without obstruction or gangrene: Secondary | ICD-10-CM | POA: Diagnosis not present

## 2016-02-16 DIAGNOSIS — K50019 Crohn's disease of small intestine with unspecified complications: Secondary | ICD-10-CM | POA: Diagnosis not present

## 2016-02-16 DIAGNOSIS — I2510 Atherosclerotic heart disease of native coronary artery without angina pectoris: Secondary | ICD-10-CM | POA: Diagnosis not present

## 2016-02-16 DIAGNOSIS — E785 Hyperlipidemia, unspecified: Secondary | ICD-10-CM | POA: Diagnosis not present

## 2016-02-16 DIAGNOSIS — K5730 Diverticulosis of large intestine without perforation or abscess without bleeding: Secondary | ICD-10-CM | POA: Diagnosis not present

## 2016-02-16 DIAGNOSIS — M170 Bilateral primary osteoarthritis of knee: Secondary | ICD-10-CM | POA: Diagnosis not present

## 2016-02-16 DIAGNOSIS — E1122 Type 2 diabetes mellitus with diabetic chronic kidney disease: Secondary | ICD-10-CM | POA: Diagnosis not present

## 2016-02-16 DIAGNOSIS — N183 Chronic kidney disease, stage 3 (moderate): Secondary | ICD-10-CM | POA: Diagnosis not present

## 2016-02-16 DIAGNOSIS — K219 Gastro-esophageal reflux disease without esophagitis: Secondary | ICD-10-CM | POA: Diagnosis not present

## 2016-02-16 DIAGNOSIS — M6281 Muscle weakness (generalized): Secondary | ICD-10-CM | POA: Diagnosis not present

## 2016-02-16 DIAGNOSIS — I5032 Chronic diastolic (congestive) heart failure: Secondary | ICD-10-CM | POA: Diagnosis not present

## 2016-02-16 DIAGNOSIS — I272 Other secondary pulmonary hypertension: Secondary | ICD-10-CM | POA: Diagnosis not present

## 2016-02-17 ENCOUNTER — Other Ambulatory Visit: Payer: Self-pay | Admitting: Family Medicine

## 2016-02-17 NOTE — Telephone Encounter (Signed)
Px written for call in   

## 2016-02-17 NOTE — Telephone Encounter (Signed)
Rx called in as prescribed 

## 2016-02-17 NOTE — Telephone Encounter (Signed)
Pt has CPE scheduled on 03/11/16, last refilled on 01/18/16 #90 with 0 refills, please advise

## 2016-02-18 DIAGNOSIS — I130 Hypertensive heart and chronic kidney disease with heart failure and stage 1 through stage 4 chronic kidney disease, or unspecified chronic kidney disease: Secondary | ICD-10-CM | POA: Diagnosis not present

## 2016-02-18 DIAGNOSIS — E785 Hyperlipidemia, unspecified: Secondary | ICD-10-CM | POA: Diagnosis not present

## 2016-02-18 DIAGNOSIS — K50019 Crohn's disease of small intestine with unspecified complications: Secondary | ICD-10-CM | POA: Diagnosis not present

## 2016-02-18 DIAGNOSIS — K219 Gastro-esophageal reflux disease without esophagitis: Secondary | ICD-10-CM | POA: Diagnosis not present

## 2016-02-18 DIAGNOSIS — K5730 Diverticulosis of large intestine without perforation or abscess without bleeding: Secondary | ICD-10-CM | POA: Diagnosis not present

## 2016-02-18 DIAGNOSIS — N183 Chronic kidney disease, stage 3 (moderate): Secondary | ICD-10-CM | POA: Diagnosis not present

## 2016-02-18 DIAGNOSIS — K449 Diaphragmatic hernia without obstruction or gangrene: Secondary | ICD-10-CM | POA: Diagnosis not present

## 2016-02-18 DIAGNOSIS — I272 Other secondary pulmonary hypertension: Secondary | ICD-10-CM | POA: Diagnosis not present

## 2016-02-18 DIAGNOSIS — I5032 Chronic diastolic (congestive) heart failure: Secondary | ICD-10-CM | POA: Diagnosis not present

## 2016-02-18 DIAGNOSIS — M170 Bilateral primary osteoarthritis of knee: Secondary | ICD-10-CM | POA: Diagnosis not present

## 2016-02-18 DIAGNOSIS — I2510 Atherosclerotic heart disease of native coronary artery without angina pectoris: Secondary | ICD-10-CM | POA: Diagnosis not present

## 2016-02-18 DIAGNOSIS — M6281 Muscle weakness (generalized): Secondary | ICD-10-CM | POA: Diagnosis not present

## 2016-02-18 DIAGNOSIS — E1122 Type 2 diabetes mellitus with diabetic chronic kidney disease: Secondary | ICD-10-CM | POA: Diagnosis not present

## 2016-02-19 ENCOUNTER — Ambulatory Visit: Payer: Medicare Other | Admitting: Family Medicine

## 2016-02-19 DIAGNOSIS — M6281 Muscle weakness (generalized): Secondary | ICD-10-CM | POA: Diagnosis not present

## 2016-02-19 DIAGNOSIS — K529 Noninfective gastroenteritis and colitis, unspecified: Secondary | ICD-10-CM | POA: Diagnosis not present

## 2016-02-22 ENCOUNTER — Telehealth: Payer: Self-pay

## 2016-02-22 ENCOUNTER — Telehealth: Payer: Self-pay | Admitting: Family Medicine

## 2016-02-22 ENCOUNTER — Encounter: Payer: Self-pay | Admitting: Family Medicine

## 2016-02-22 ENCOUNTER — Ambulatory Visit (INDEPENDENT_AMBULATORY_CARE_PROVIDER_SITE_OTHER)
Admission: RE | Admit: 2016-02-22 | Discharge: 2016-02-22 | Disposition: A | Discharge status: Home / Self Care | Payer: Medicare Other | Source: Ambulatory Visit | Attending: Family Medicine | Admitting: Family Medicine

## 2016-02-22 ENCOUNTER — Ambulatory Visit (INDEPENDENT_AMBULATORY_CARE_PROVIDER_SITE_OTHER): Payer: Medicare Other | Admitting: Family Medicine

## 2016-02-22 VITALS — BP 136/74 | HR 65 | Temp 97.6°F | Ht 60.0 in | Wt 152.2 lb

## 2016-02-22 DIAGNOSIS — N183 Chronic kidney disease, stage 3 (moderate): Secondary | ICD-10-CM | POA: Diagnosis not present

## 2016-02-22 DIAGNOSIS — K5730 Diverticulosis of large intestine without perforation or abscess without bleeding: Secondary | ICD-10-CM | POA: Diagnosis not present

## 2016-02-22 DIAGNOSIS — J181 Lobar pneumonia, unspecified organism: Principal | ICD-10-CM

## 2016-02-22 DIAGNOSIS — I110 Hypertensive heart disease with heart failure: Secondary | ICD-10-CM | POA: Diagnosis not present

## 2016-02-22 DIAGNOSIS — K50019 Crohn's disease of small intestine with unspecified complications: Secondary | ICD-10-CM | POA: Diagnosis not present

## 2016-02-22 DIAGNOSIS — I272 Other secondary pulmonary hypertension: Secondary | ICD-10-CM | POA: Diagnosis not present

## 2016-02-22 DIAGNOSIS — I5032 Chronic diastolic (congestive) heart failure: Secondary | ICD-10-CM | POA: Diagnosis not present

## 2016-02-22 DIAGNOSIS — E785 Hyperlipidemia, unspecified: Secondary | ICD-10-CM | POA: Diagnosis not present

## 2016-02-22 DIAGNOSIS — I2510 Atherosclerotic heart disease of native coronary artery without angina pectoris: Secondary | ICD-10-CM | POA: Diagnosis not present

## 2016-02-22 DIAGNOSIS — K449 Diaphragmatic hernia without obstruction or gangrene: Secondary | ICD-10-CM | POA: Diagnosis not present

## 2016-02-22 DIAGNOSIS — K219 Gastro-esophageal reflux disease without esophagitis: Secondary | ICD-10-CM | POA: Diagnosis not present

## 2016-02-22 DIAGNOSIS — J189 Pneumonia, unspecified organism: Secondary | ICD-10-CM

## 2016-02-22 DIAGNOSIS — E1122 Type 2 diabetes mellitus with diabetic chronic kidney disease: Secondary | ICD-10-CM | POA: Diagnosis not present

## 2016-02-22 DIAGNOSIS — M170 Bilateral primary osteoarthritis of knee: Secondary | ICD-10-CM | POA: Diagnosis not present

## 2016-02-22 NOTE — Telephone Encounter (Signed)
Please ok that verbal order  

## 2016-02-22 NOTE — Patient Instructions (Signed)
Chest xray now  I'm glad you finished your antibiotics  Encourage rest  Do try to eliminate straw from drinking (to decrease the risk of aspiration) Watch for increased cough and fever and shortness of breath   We will contact you with your xray result

## 2016-02-22 NOTE — Progress Notes (Signed)
Subjective:    Patient ID: Ellen Peterson, female    DOB: Sep 28, 1920, 82 y.o.   MRN: [id number]  HPI Here for f/u of pneumonia (RUL)  Seen by Dr Diona Browner on 2/24  Had been tx in hospital for CAP in Jan -of note  Found CXR- RUL infiltrate-poss early LUL  DG Chest 2 View   Status: Final result       PACS Images     Show images for DG Chest 2 View     Study Result     CLINICAL DATA: 82 year old female with cough for 2 days. Initial encounter.  EXAM: CHEST 2 VIEW  COMPARISON: Chest radiographs 12/25/2015 and earlier. Chest CTA 02/15/2014 and earlier.  FINDINGS: Chronic right upper lobe peribronchial thickening near the minor fissure with acute superimposed peribronchial opacity. Subtle increased opacity also in the peripheral left upper lobe (arrows). Elsewhere lung parenchyma stable and clear. No pleural effusion. Stable cardiac size and mediastinal contours. No acute osseous abnormality identified. Stable cholecystectomy clips.  Calcified aortic atherosclerosis.  IMPRESSION: Recurrent acute distal airway infection in the right upper lobe. Possible also early involvement of the left upper lobe. No associated pleural effusion.  These results will be called to the ordering clinician or representative by the Radiologist Assistant, and communication documented in the PACS or zVision Dashboard.    tx with levaquin and did well   02 sat is 100% today  Wt is stable   Not a lot of cough - little bit  (is light-not a whole lot of phlegm) Not sob -per pt (no more than usual)  No fever Appetite -not great overall   levaquin - gave her some insomnia (did better taking in the am)  Tried unisom for sleep  Finished antibiotic   Does tend to cough if she drinks with a straw (which she prefers) Family is switching her to a tipping cup instead of a straw when possible ? If she could have aspirated   Patient Active Problem List   Diagnosis Date Noted  .  Right upper lobe pneumonia 02/22/2016  . Cough 02/12/2016  . Mobility impaired 01/28/2016  . Osteoarthritis of both knees 01/28/2016  . Cognitive decline 01/28/2016  . CAP (community acquired pneumonia) 01/02/2016  . Regional enteritis of jejunum ([place]) 11/27/2015  . CKD (chronic kidney disease), stage III 11/27/2015  . Chronic diastolic heart failure ([place]) 11/27/2015  . Diabetes mellitus type 2, diet-controlled ([place]) 11/27/2015  . Abdominal pain, left lower quadrant 11/11/2015  . Left groin pain 11/11/2015  . Shortness of breath 08/20/2014  . History of DVT (deep vein thrombosis) 07/23/2014  . CAD (coronary artery disease), native coronary artery 07/23/2014  . Pulmonary hypertension ([place]) 07/23/2014  . Right ventricular dysfunction 07/23/2014  . HX: anticoagulation 07/23/2014  . Dysphasia 07/23/2014  . Ejection fraction   . Essential hypertension, benign 06/22/2014  . UI (urinary incontinence) 06/22/2014  . GERD (gastroesophageal reflux disease) 06/22/2014  . Physical deconditioning 06/22/2014  . History of pulmonary embolism 06/15/2014  . Encounter for Medicare annual wellness exam 08/08/2013  . LBBB (left bundle branch block)   . Hyperlipidemia   . Wide-complex tachycardia ([place])   . Venous insufficiency 06/03/2011  . Hyperglycemia 11/17/2010  . Anemia 02/08/2010  . Renal insufficiency 10/27/2009  . Recurrent UTI 09/22/2008  . Vitamin D deficiency 09/10/2008  . COLONIC POLYPS, ADENOMATOUS 04/25/2007  . PERIPHERAL VASCULAR DISEASE 04/25/2007  . DIVERTICULOSIS, COLON 04/25/2007  . IBS 04/25/2007  . Osteoarthritis 04/25/2007  . Polymyalgia rheumatica (  [place]) 04/25/2007   Past Medical History  Diagnosis Date  . Diabetes mellitus, type 2 ([place])   . Diverticulosis of colon   . GERD (gastroesophageal reflux disease)   . Hyperlipidemia   . Hypertension   . OA (osteoarthritis)   . Peripheral vascular disease ([place])   . PMR (polymyalgia rheumatica) ([facility])   . Frequent UTI   .  Chest tightness     catheterization, 2005, normal coronaries  . Wide-complex tachycardia ([facility])     SVT in past  . Hypokalemia   . LBBB (left bundle branch block)     Since at least 2005  . Ejection fraction < 50%     EF 40% echo, 09/2009  . Complication of anesthesia   . PONV (postoperative nausea and vomiting)   . PE (pulmonary embolism) 06/16/2014  . Shortness of breath   . Recurrent UTI   . Enteritis 11/2015   Past Surgical History  Procedure Laterality Date  . Appendectomy  1993  . Cholecystectomy    . Colectomy  1993    partial  . Total abdominal hysterectomy  1960s    fibroids   . Vein ligation and stripping    . Bladder tack up    . Back surgery    . Esophagogastroduodenoscopy  05/2004    baretts esophagus, stomach polyps  . Bilateral pyelonephritis    . Cardiac catheterization  2005    no significant CAD  . Tonsillectomy    . Left heart catheterization with coronary angiogram N/A 06/12/2014    Procedure: LEFT HEART CATHETERIZATION WITH CORONARY ANGIOGRAM;  Surgeon: Peter M Martinique, MD;  Location: [facility] CATH LAB;  Service: Cardiovascular;  Laterality: N/A;  . [place]  . Enteroscopy N/A 11/29/2015    Procedure: ENTEROSCOPY;  Surgeon: Ladene Artist, MD;  Location: [facility] ENDOSCOPY;  Service: Endoscopy;  Laterality: N/A;   Social History  Substance Use Topics  . Smoking status: Never Smoker   . Smokeless tobacco: Never Used  . Alcohol Use: No   Family History  Problem Relation Age of Onset  . CAD Mother   . Hypertension Mother   . Stroke Mother   . Pancreatic cancer Sister   . Diabetes Brother   . Heart Problems Brother   . Heart attack Mother   . Heart attack Brother    Allergies  Allergen Reactions  . Metronidazole Hives  . Nitrofurantoin Hives  . Nsaids Other (See Comments)    REACTION: GI symptoms  . Rofecoxib Other (See Comments)    REACTION: GI symptoms  . Simvastatin Other (See Comments)    REACTION: myalgia  . Erythromycin Rash  .  Metformin Diarrhea and Nausea And Vomiting  . Sulfamethoxazole-Trimethoprim Itching  . Tetracycline Rash   Current Outpatient Prescriptions on File Prior to Visit  Medication Sig Dispense Refill  . acetaminophen (TYLENOL) 500 MG tablet Take 500 mg by mouth every 6 (six) hours as needed (pain).     . cholecalciferol (VITAMIN D) 1000 UNITS tablet Take 1,000 Units by mouth daily.      Marland Kitchen CINNAMON PO Take 1,000 mg by mouth daily.    . diclofenac sodium (VOLTAREN) 1 % GEL Apply 2 g topically 2 (two) times daily as needed (knee pain). 6 Tube 3  . hydrochlorothiazide (HYDRODIURIL) 25 MG tablet Take 1 tablet (25 mg total) by mouth daily. 90 tablet 3  . isosorbide mononitrate (IMDUR) 30 MG 24 hr tablet TAKE 1 TABLET BY MOUTH DAILY (Patient taking differently: TAKE  0.5 TABLET (15 mg) BY MOUTH BID) 30 tablet 11  . losartan (COZAAR) 50 MG tablet Take 1 tablet (50 mg total) by mouth daily. 90 tablet 3  . metoprolol tartrate (LOPRESSOR) 25 MG tablet TAKE 1 TABLET BY MOUTH TWICE A DAY 60 tablet 11  . Multiple Vitamins-Minerals (ICAPS PO) Take 1 capsule by mouth daily.    . Multiple Vitamins-Minerals (MULTIVITAMIN & MINERAL PO) Take 1 tablet by mouth daily.    . nitroGLYCERIN (NITROSTAT) 0.4 MG SL tablet Place 1 tablet (0.4 mg total) under the tongue every 5 (five) minutes as needed for chest pain. As directed and as needed. 10 tablet 3  . Omega-3 Fatty Acids (FISH OIL) 1000 MG CAPS Take 1,000 mg by mouth daily.    . pantoprazole (PROTONIX) 40 MG tablet Take 1 tablet (40 mg total) by mouth 2 (two) times daily. 180 tablet 3  . rivaroxaban (XARELTO) 15 MG TABS tablet Take 1 tablet (15 mg total) by mouth daily with supper. Starting 07/08/14 30 tablet 6  . traMADol (ULTRAM) 50 MG tablet TAKE 1 TABLET EVERY EIGHT HOURS AS NEEDED FOR PAIN 90 tablet 0  . vitamin E 400 UNIT capsule Take 400 Units by mouth daily.       No current facility-administered medications on file prior to visit.      Review of  Systems Review of Systems  Constitutional: Negative for fever, appetite change,  and unexpected weight change. pos for fatigue  Eyes: Negative for pain and visual disturbance.  Respiratory: Negative for shortness of breath.  pos for cough that is mild and almost resolved  Cardiovascular: Negative for cp or palpitations    Gastrointestinal: Negative for nausea, diarrhea and constipation.  Genitourinary: Negative for urgency and frequency. neg for dysuria Skin: Negative for pallor or rash   Neurological: Negative for weakness, light-headedness, numbness and headaches. pos for generalized weakness that is improving  Hematological: Negative for adenopathy. Does not bruise/bleed easily.  Psychiatric/Behavioral: Negative for dysphoric mood. The patient is not nervous/anxious.         Objective:   Physical Exam  Constitutional: She appears well-developed and well-nourished. No distress.  Frail appearing elderly female in no distress   HENT:  Head: Normocephalic and atraumatic.  Right Ear: External ear normal.  Left Ear: External ear normal.  Nose: Nose normal.  Mouth/Throat: Oropharynx is clear and moist. No oropharyngeal exudate.  No sinus tenderness   Eyes: Conjunctivae and EOM are normal. Pupils are equal, round, and reactive to light.  Neck: Normal range of motion. Neck supple. No JVD present. Carotid bruit is not present. No thyromegaly present.  Cardiovascular: Normal rate, regular rhythm, normal heart sounds and intact distal pulses.  Exam reveals no gallop.   Pulmonary/Chest: Effort normal and breath sounds normal. No respiratory distress. She has no wheezes. She has no rales. She exhibits no tenderness.  No crackles  Abdominal: Soft. Bowel sounds are normal. She exhibits no distension, no abdominal bruit and no mass. There is no tenderness.  Musculoskeletal: She exhibits no edema or tenderness.  Lymphadenopathy:    She has no cervical adenopathy.  Neurological: She is alert. She  has normal reflexes.  Skin: Skin is warm and dry. No rash noted.  Psychiatric: She has a normal mood and affect.          Assessment & Plan:   Problem List Items Addressed This Visit      Respiratory   Right upper lobe pneumonia - Primary    Much  improved clinically and on cxr today  Reassuring exam  Done with levaquin Will watch for return of cough or any sob or MS change  Enc fluids/rest and adv to regular activities as tolerated   Disc imp of reducing risk of choking or aspiration by stopping use of straws- caregiver voiced understanding       Relevant Orders   DG Chest 2 View (Completed)

## 2016-02-22 NOTE — Telephone Encounter (Signed)
Ellen Peterson OT with [facility] left v/m requesting extending home health OT and home health aide 2 x a week for 3 weeks.

## 2016-02-22 NOTE — Telephone Encounter (Signed)
Verbal order given  

## 2016-02-22 NOTE — Telephone Encounter (Signed)
wes from [facility] called - requesting verbal order for pt  Twice a week for 3 weeks cb number is [phone]

## 2016-02-22 NOTE — Progress Notes (Signed)
Pre visit review using our clinic review tool, if applicable. No additional management support is needed unless otherwise documented below in the visit note. 

## 2016-02-22 NOTE — Telephone Encounter (Signed)
Please ok that verbal order Thanks  

## 2016-02-22 NOTE — Telephone Encounter (Signed)
Ellen Peterson was given verbal order

## 2016-02-22 NOTE — Assessment & Plan Note (Addendum)
Much improved clinically and on cxr today  Reassuring exam  Done with levaquin Will watch for return of cough or any sob or MS change  Enc fluids/rest and adv to regular activities as tolerated   Disc imp of reducing risk of choking or aspiration by stopping use of straws- caregiver voiced understanding

## 2016-02-23 DIAGNOSIS — J189 Pneumonia, unspecified organism: Secondary | ICD-10-CM | POA: Diagnosis not present

## 2016-02-23 DIAGNOSIS — K5730 Diverticulosis of large intestine without perforation or abscess without bleeding: Secondary | ICD-10-CM | POA: Diagnosis not present

## 2016-02-23 DIAGNOSIS — I5032 Chronic diastolic (congestive) heart failure: Secondary | ICD-10-CM | POA: Diagnosis not present

## 2016-02-23 DIAGNOSIS — I2510 Atherosclerotic heart disease of native coronary artery without angina pectoris: Secondary | ICD-10-CM | POA: Diagnosis not present

## 2016-02-23 DIAGNOSIS — I110 Hypertensive heart disease with heart failure: Secondary | ICD-10-CM | POA: Diagnosis not present

## 2016-02-23 DIAGNOSIS — M170 Bilateral primary osteoarthritis of knee: Secondary | ICD-10-CM | POA: Diagnosis not present

## 2016-02-23 DIAGNOSIS — N183 Chronic kidney disease, stage 3 (moderate): Secondary | ICD-10-CM | POA: Diagnosis not present

## 2016-02-23 DIAGNOSIS — K449 Diaphragmatic hernia without obstruction or gangrene: Secondary | ICD-10-CM | POA: Diagnosis not present

## 2016-02-23 DIAGNOSIS — E1122 Type 2 diabetes mellitus with diabetic chronic kidney disease: Secondary | ICD-10-CM | POA: Diagnosis not present

## 2016-02-23 DIAGNOSIS — K50019 Crohn's disease of small intestine with unspecified complications: Secondary | ICD-10-CM | POA: Diagnosis not present

## 2016-02-23 DIAGNOSIS — I272 Other secondary pulmonary hypertension: Secondary | ICD-10-CM | POA: Diagnosis not present

## 2016-02-23 DIAGNOSIS — E785 Hyperlipidemia, unspecified: Secondary | ICD-10-CM | POA: Diagnosis not present

## 2016-02-23 DIAGNOSIS — K219 Gastro-esophageal reflux disease without esophagitis: Secondary | ICD-10-CM | POA: Diagnosis not present

## 2016-02-25 DIAGNOSIS — K50019 Crohn's disease of small intestine with unspecified complications: Secondary | ICD-10-CM | POA: Diagnosis not present

## 2016-02-25 DIAGNOSIS — N183 Chronic kidney disease, stage 3 (moderate): Secondary | ICD-10-CM | POA: Diagnosis not present

## 2016-02-25 DIAGNOSIS — E1122 Type 2 diabetes mellitus with diabetic chronic kidney disease: Secondary | ICD-10-CM | POA: Diagnosis not present

## 2016-02-25 DIAGNOSIS — E785 Hyperlipidemia, unspecified: Secondary | ICD-10-CM | POA: Diagnosis not present

## 2016-02-25 DIAGNOSIS — I272 Other secondary pulmonary hypertension: Secondary | ICD-10-CM | POA: Diagnosis not present

## 2016-02-25 DIAGNOSIS — K5730 Diverticulosis of large intestine without perforation or abscess without bleeding: Secondary | ICD-10-CM | POA: Diagnosis not present

## 2016-02-25 DIAGNOSIS — K219 Gastro-esophageal reflux disease without esophagitis: Secondary | ICD-10-CM | POA: Diagnosis not present

## 2016-02-25 DIAGNOSIS — M170 Bilateral primary osteoarthritis of knee: Secondary | ICD-10-CM | POA: Diagnosis not present

## 2016-02-25 DIAGNOSIS — K449 Diaphragmatic hernia without obstruction or gangrene: Secondary | ICD-10-CM | POA: Diagnosis not present

## 2016-02-25 DIAGNOSIS — I5032 Chronic diastolic (congestive) heart failure: Secondary | ICD-10-CM | POA: Diagnosis not present

## 2016-02-25 DIAGNOSIS — J189 Pneumonia, unspecified organism: Secondary | ICD-10-CM | POA: Diagnosis not present

## 2016-02-25 DIAGNOSIS — I110 Hypertensive heart disease with heart failure: Secondary | ICD-10-CM | POA: Diagnosis not present

## 2016-02-25 DIAGNOSIS — I2510 Atherosclerotic heart disease of native coronary artery without angina pectoris: Secondary | ICD-10-CM | POA: Diagnosis not present

## 2016-02-26 DIAGNOSIS — J189 Pneumonia, unspecified organism: Secondary | ICD-10-CM | POA: Diagnosis not present

## 2016-02-26 DIAGNOSIS — E785 Hyperlipidemia, unspecified: Secondary | ICD-10-CM | POA: Diagnosis not present

## 2016-02-26 DIAGNOSIS — K449 Diaphragmatic hernia without obstruction or gangrene: Secondary | ICD-10-CM | POA: Diagnosis not present

## 2016-02-26 DIAGNOSIS — I110 Hypertensive heart disease with heart failure: Secondary | ICD-10-CM | POA: Diagnosis not present

## 2016-02-26 DIAGNOSIS — K5730 Diverticulosis of large intestine without perforation or abscess without bleeding: Secondary | ICD-10-CM | POA: Diagnosis not present

## 2016-02-26 DIAGNOSIS — I2510 Atherosclerotic heart disease of native coronary artery without angina pectoris: Secondary | ICD-10-CM | POA: Diagnosis not present

## 2016-02-26 DIAGNOSIS — I5032 Chronic diastolic (congestive) heart failure: Secondary | ICD-10-CM | POA: Diagnosis not present

## 2016-02-26 DIAGNOSIS — E1122 Type 2 diabetes mellitus with diabetic chronic kidney disease: Secondary | ICD-10-CM | POA: Diagnosis not present

## 2016-02-26 DIAGNOSIS — N183 Chronic kidney disease, stage 3 (moderate): Secondary | ICD-10-CM | POA: Diagnosis not present

## 2016-02-26 DIAGNOSIS — I272 Other secondary pulmonary hypertension: Secondary | ICD-10-CM | POA: Diagnosis not present

## 2016-02-26 DIAGNOSIS — K50019 Crohn's disease of small intestine with unspecified complications: Secondary | ICD-10-CM | POA: Diagnosis not present

## 2016-02-26 DIAGNOSIS — M170 Bilateral primary osteoarthritis of knee: Secondary | ICD-10-CM | POA: Diagnosis not present

## 2016-02-26 DIAGNOSIS — K219 Gastro-esophageal reflux disease without esophagitis: Secondary | ICD-10-CM | POA: Diagnosis not present

## 2016-02-28 DIAGNOSIS — I110 Hypertensive heart disease with heart failure: Secondary | ICD-10-CM | POA: Diagnosis not present

## 2016-02-28 DIAGNOSIS — N183 Chronic kidney disease, stage 3 (moderate): Secondary | ICD-10-CM | POA: Diagnosis not present

## 2016-02-28 DIAGNOSIS — K219 Gastro-esophageal reflux disease without esophagitis: Secondary | ICD-10-CM | POA: Diagnosis not present

## 2016-02-28 DIAGNOSIS — I5032 Chronic diastolic (congestive) heart failure: Secondary | ICD-10-CM | POA: Diagnosis not present

## 2016-02-28 DIAGNOSIS — K5730 Diverticulosis of large intestine without perforation or abscess without bleeding: Secondary | ICD-10-CM | POA: Diagnosis not present

## 2016-02-28 DIAGNOSIS — I2510 Atherosclerotic heart disease of native coronary artery without angina pectoris: Secondary | ICD-10-CM | POA: Diagnosis not present

## 2016-02-28 DIAGNOSIS — M170 Bilateral primary osteoarthritis of knee: Secondary | ICD-10-CM | POA: Diagnosis not present

## 2016-02-28 DIAGNOSIS — K50019 Crohn's disease of small intestine with unspecified complications: Secondary | ICD-10-CM | POA: Diagnosis not present

## 2016-02-28 DIAGNOSIS — J189 Pneumonia, unspecified organism: Secondary | ICD-10-CM | POA: Diagnosis not present

## 2016-02-28 DIAGNOSIS — E1122 Type 2 diabetes mellitus with diabetic chronic kidney disease: Secondary | ICD-10-CM | POA: Diagnosis not present

## 2016-02-28 DIAGNOSIS — I272 Other secondary pulmonary hypertension: Secondary | ICD-10-CM | POA: Diagnosis not present

## 2016-02-28 DIAGNOSIS — K449 Diaphragmatic hernia without obstruction or gangrene: Secondary | ICD-10-CM | POA: Diagnosis not present

## 2016-02-28 DIAGNOSIS — E785 Hyperlipidemia, unspecified: Secondary | ICD-10-CM | POA: Diagnosis not present

## 2016-03-01 DIAGNOSIS — E1122 Type 2 diabetes mellitus with diabetic chronic kidney disease: Secondary | ICD-10-CM | POA: Diagnosis not present

## 2016-03-01 DIAGNOSIS — K219 Gastro-esophageal reflux disease without esophagitis: Secondary | ICD-10-CM | POA: Diagnosis not present

## 2016-03-01 DIAGNOSIS — K449 Diaphragmatic hernia without obstruction or gangrene: Secondary | ICD-10-CM | POA: Diagnosis not present

## 2016-03-01 DIAGNOSIS — K5730 Diverticulosis of large intestine without perforation or abscess without bleeding: Secondary | ICD-10-CM | POA: Diagnosis not present

## 2016-03-01 DIAGNOSIS — N183 Chronic kidney disease, stage 3 (moderate): Secondary | ICD-10-CM | POA: Diagnosis not present

## 2016-03-01 DIAGNOSIS — K50019 Crohn's disease of small intestine with unspecified complications: Secondary | ICD-10-CM | POA: Diagnosis not present

## 2016-03-01 DIAGNOSIS — M170 Bilateral primary osteoarthritis of knee: Secondary | ICD-10-CM | POA: Diagnosis not present

## 2016-03-01 DIAGNOSIS — J189 Pneumonia, unspecified organism: Secondary | ICD-10-CM | POA: Diagnosis not present

## 2016-03-01 DIAGNOSIS — I5032 Chronic diastolic (congestive) heart failure: Secondary | ICD-10-CM | POA: Diagnosis not present

## 2016-03-01 DIAGNOSIS — I110 Hypertensive heart disease with heart failure: Secondary | ICD-10-CM | POA: Diagnosis not present

## 2016-03-01 DIAGNOSIS — I272 Other secondary pulmonary hypertension: Secondary | ICD-10-CM | POA: Diagnosis not present

## 2016-03-01 DIAGNOSIS — I2510 Atherosclerotic heart disease of native coronary artery without angina pectoris: Secondary | ICD-10-CM | POA: Diagnosis not present

## 2016-03-01 DIAGNOSIS — E785 Hyperlipidemia, unspecified: Secondary | ICD-10-CM | POA: Diagnosis not present

## 2016-03-02 DIAGNOSIS — M170 Bilateral primary osteoarthritis of knee: Secondary | ICD-10-CM | POA: Diagnosis not present

## 2016-03-02 DIAGNOSIS — E785 Hyperlipidemia, unspecified: Secondary | ICD-10-CM | POA: Diagnosis not present

## 2016-03-02 DIAGNOSIS — I5032 Chronic diastolic (congestive) heart failure: Secondary | ICD-10-CM | POA: Diagnosis not present

## 2016-03-02 DIAGNOSIS — K449 Diaphragmatic hernia without obstruction or gangrene: Secondary | ICD-10-CM | POA: Diagnosis not present

## 2016-03-02 DIAGNOSIS — J189 Pneumonia, unspecified organism: Secondary | ICD-10-CM | POA: Diagnosis not present

## 2016-03-02 DIAGNOSIS — I2510 Atherosclerotic heart disease of native coronary artery without angina pectoris: Secondary | ICD-10-CM | POA: Diagnosis not present

## 2016-03-02 DIAGNOSIS — K50019 Crohn's disease of small intestine with unspecified complications: Secondary | ICD-10-CM | POA: Diagnosis not present

## 2016-03-02 DIAGNOSIS — N183 Chronic kidney disease, stage 3 (moderate): Secondary | ICD-10-CM | POA: Diagnosis not present

## 2016-03-02 DIAGNOSIS — E1122 Type 2 diabetes mellitus with diabetic chronic kidney disease: Secondary | ICD-10-CM | POA: Diagnosis not present

## 2016-03-02 DIAGNOSIS — I110 Hypertensive heart disease with heart failure: Secondary | ICD-10-CM | POA: Diagnosis not present

## 2016-03-02 DIAGNOSIS — I272 Other secondary pulmonary hypertension: Secondary | ICD-10-CM | POA: Diagnosis not present

## 2016-03-02 DIAGNOSIS — K5730 Diverticulosis of large intestine without perforation or abscess without bleeding: Secondary | ICD-10-CM | POA: Diagnosis not present

## 2016-03-02 DIAGNOSIS — K219 Gastro-esophageal reflux disease without esophagitis: Secondary | ICD-10-CM | POA: Diagnosis not present

## 2016-03-03 DIAGNOSIS — K50019 Crohn's disease of small intestine with unspecified complications: Secondary | ICD-10-CM | POA: Diagnosis not present

## 2016-03-03 DIAGNOSIS — E1122 Type 2 diabetes mellitus with diabetic chronic kidney disease: Secondary | ICD-10-CM | POA: Diagnosis not present

## 2016-03-03 DIAGNOSIS — I5032 Chronic diastolic (congestive) heart failure: Secondary | ICD-10-CM | POA: Diagnosis not present

## 2016-03-03 DIAGNOSIS — M170 Bilateral primary osteoarthritis of knee: Secondary | ICD-10-CM | POA: Diagnosis not present

## 2016-03-03 DIAGNOSIS — K449 Diaphragmatic hernia without obstruction or gangrene: Secondary | ICD-10-CM | POA: Diagnosis not present

## 2016-03-03 DIAGNOSIS — I272 Other secondary pulmonary hypertension: Secondary | ICD-10-CM | POA: Diagnosis not present

## 2016-03-03 DIAGNOSIS — K5730 Diverticulosis of large intestine without perforation or abscess without bleeding: Secondary | ICD-10-CM | POA: Diagnosis not present

## 2016-03-03 DIAGNOSIS — N183 Chronic kidney disease, stage 3 (moderate): Secondary | ICD-10-CM | POA: Diagnosis not present

## 2016-03-03 DIAGNOSIS — E785 Hyperlipidemia, unspecified: Secondary | ICD-10-CM | POA: Diagnosis not present

## 2016-03-03 DIAGNOSIS — J189 Pneumonia, unspecified organism: Secondary | ICD-10-CM | POA: Diagnosis not present

## 2016-03-03 DIAGNOSIS — I2510 Atherosclerotic heart disease of native coronary artery without angina pectoris: Secondary | ICD-10-CM | POA: Diagnosis not present

## 2016-03-03 DIAGNOSIS — I110 Hypertensive heart disease with heart failure: Secondary | ICD-10-CM | POA: Diagnosis not present

## 2016-03-03 DIAGNOSIS — K219 Gastro-esophageal reflux disease without esophagitis: Secondary | ICD-10-CM | POA: Diagnosis not present

## 2016-03-04 ENCOUNTER — Other Ambulatory Visit (INDEPENDENT_AMBULATORY_CARE_PROVIDER_SITE_OTHER): Payer: Medicare Other

## 2016-03-04 DIAGNOSIS — E119 Type 2 diabetes mellitus without complications: Secondary | ICD-10-CM

## 2016-03-04 DIAGNOSIS — E559 Vitamin D deficiency, unspecified: Secondary | ICD-10-CM

## 2016-03-04 DIAGNOSIS — I10 Essential (primary) hypertension: Secondary | ICD-10-CM | POA: Diagnosis not present

## 2016-03-04 LAB — CBC WITH DIFFERENTIAL/PLATELET
Basophils Absolute: 0 10*3/uL (ref 0.0–0.1)
Basophils Relative: 0.3 % (ref 0.0–3.0)
Eosinophils Absolute: 0.3 10*3/uL (ref 0.0–0.7)
Eosinophils Relative: 4.5 % (ref 0.0–5.0)
HCT: 34.1 % — ABNORMAL LOW (ref 36.0–46.0)
Hemoglobin: 11.4 g/dL — ABNORMAL LOW (ref 12.0–15.0)
Lymphocytes Relative: 29.8 % (ref 12.0–46.0)
Lymphs Abs: 2.1 10*3/uL (ref 0.7–4.0)
MCHC: 33.3 g/dL (ref 30.0–36.0)
MCV: 89.8 fl (ref 78.0–100.0)
Monocytes Absolute: 0.8 10*3/uL (ref 0.1–1.0)
Monocytes Relative: 10.9 % (ref 3.0–12.0)
Neutro Abs: 3.9 10*3/uL (ref 1.4–7.7)
Neutrophils Relative %: 54.5 % (ref 43.0–77.0)
Platelets: 325 10*3/uL (ref 150.0–400.0)
RBC: 3.79 Mil/uL — ABNORMAL LOW (ref 3.87–5.11)
RDW: 16 % — ABNORMAL HIGH (ref 11.5–15.5)
WBC: 7.1 10*3/uL (ref 4.0–10.5)

## 2016-03-04 LAB — COMPREHENSIVE METABOLIC PANEL
ALBUMIN: 3.7 g/dL (ref 3.5–5.2)
ALT: 13 U/L (ref 0–35)
AST: 20 U/L (ref 0–37)
Alkaline Phosphatase: 51 U/L (ref 39–117)
BUN: 30 mg/dL — AB (ref 6–23)
CHLORIDE: 106 meq/L (ref 96–112)
CO2: 26 meq/L (ref 19–32)
Calcium: 9.6 mg/dL (ref 8.4–10.5)
Creatinine, Ser: 1.26 mg/dL — ABNORMAL HIGH (ref 0.40–1.20)
GFR: 42.09 mL/min — ABNORMAL LOW (ref >60.00)
GLUCOSE: 105 mg/dL — AB (ref 70–99)
POTASSIUM: 4.3 meq/L (ref 3.5–5.1)
SODIUM: 141 meq/L (ref 135–145)
Total Bilirubin: 0.5 mg/dL (ref 0.2–1.2)
Total Protein: 6.8 g/dL (ref 6.0–8.3)

## 2016-03-04 LAB — LIPID PANEL
Cholesterol: 197 mg/dL (ref 0–200)
HDL: 51.8 mg/dL (ref >39.00)
LDL Cholesterol: 118 mg/dL — ABNORMAL HIGH (ref 0–99)
NONHDL: 145.58
Total CHOL/HDL Ratio: 4
Triglycerides: 140 mg/dL (ref 0.0–149.0)
VLDL: 28 mg/dL (ref 0.0–40.0)

## 2016-03-04 LAB — TSH: TSH: 1.89 u[IU]/mL (ref 0.35–4.50)

## 2016-03-04 LAB — VITAMIN D 25 HYDROXY (VIT D DEFICIENCY, FRACTURES): VITD: 37.56 ng/mL (ref 30.00–100.00)

## 2016-03-04 LAB — HEMOGLOBIN A1C: HEMOGLOBIN A1C: 6.4 % (ref 4.6–6.5)

## 2016-03-07 DIAGNOSIS — E1122 Type 2 diabetes mellitus with diabetic chronic kidney disease: Secondary | ICD-10-CM | POA: Diagnosis not present

## 2016-03-07 DIAGNOSIS — I272 Other secondary pulmonary hypertension: Secondary | ICD-10-CM | POA: Diagnosis not present

## 2016-03-07 DIAGNOSIS — K219 Gastro-esophageal reflux disease without esophagitis: Secondary | ICD-10-CM | POA: Diagnosis not present

## 2016-03-07 DIAGNOSIS — I110 Hypertensive heart disease with heart failure: Secondary | ICD-10-CM | POA: Diagnosis not present

## 2016-03-07 DIAGNOSIS — N183 Chronic kidney disease, stage 3 (moderate): Secondary | ICD-10-CM | POA: Diagnosis not present

## 2016-03-07 DIAGNOSIS — I2510 Atherosclerotic heart disease of native coronary artery without angina pectoris: Secondary | ICD-10-CM | POA: Diagnosis not present

## 2016-03-07 DIAGNOSIS — E785 Hyperlipidemia, unspecified: Secondary | ICD-10-CM | POA: Diagnosis not present

## 2016-03-07 DIAGNOSIS — J189 Pneumonia, unspecified organism: Secondary | ICD-10-CM | POA: Diagnosis not present

## 2016-03-07 DIAGNOSIS — K449 Diaphragmatic hernia without obstruction or gangrene: Secondary | ICD-10-CM | POA: Diagnosis not present

## 2016-03-07 DIAGNOSIS — K5730 Diverticulosis of large intestine without perforation or abscess without bleeding: Secondary | ICD-10-CM | POA: Diagnosis not present

## 2016-03-07 DIAGNOSIS — M170 Bilateral primary osteoarthritis of knee: Secondary | ICD-10-CM | POA: Diagnosis not present

## 2016-03-07 DIAGNOSIS — K50019 Crohn's disease of small intestine with unspecified complications: Secondary | ICD-10-CM | POA: Diagnosis not present

## 2016-03-07 DIAGNOSIS — I5032 Chronic diastolic (congestive) heart failure: Secondary | ICD-10-CM | POA: Diagnosis not present

## 2016-03-08 DIAGNOSIS — I110 Hypertensive heart disease with heart failure: Secondary | ICD-10-CM | POA: Diagnosis not present

## 2016-03-08 DIAGNOSIS — N183 Chronic kidney disease, stage 3 (moderate): Secondary | ICD-10-CM | POA: Diagnosis not present

## 2016-03-08 DIAGNOSIS — M170 Bilateral primary osteoarthritis of knee: Secondary | ICD-10-CM | POA: Diagnosis not present

## 2016-03-08 DIAGNOSIS — K50019 Crohn's disease of small intestine with unspecified complications: Secondary | ICD-10-CM | POA: Diagnosis not present

## 2016-03-08 DIAGNOSIS — I5032 Chronic diastolic (congestive) heart failure: Secondary | ICD-10-CM | POA: Diagnosis not present

## 2016-03-08 DIAGNOSIS — K449 Diaphragmatic hernia without obstruction or gangrene: Secondary | ICD-10-CM | POA: Diagnosis not present

## 2016-03-08 DIAGNOSIS — K219 Gastro-esophageal reflux disease without esophagitis: Secondary | ICD-10-CM | POA: Diagnosis not present

## 2016-03-08 DIAGNOSIS — E785 Hyperlipidemia, unspecified: Secondary | ICD-10-CM | POA: Diagnosis not present

## 2016-03-08 DIAGNOSIS — K5730 Diverticulosis of large intestine without perforation or abscess without bleeding: Secondary | ICD-10-CM | POA: Diagnosis not present

## 2016-03-08 DIAGNOSIS — I2510 Atherosclerotic heart disease of native coronary artery without angina pectoris: Secondary | ICD-10-CM | POA: Diagnosis not present

## 2016-03-08 DIAGNOSIS — E1122 Type 2 diabetes mellitus with diabetic chronic kidney disease: Secondary | ICD-10-CM | POA: Diagnosis not present

## 2016-03-08 DIAGNOSIS — J189 Pneumonia, unspecified organism: Secondary | ICD-10-CM | POA: Diagnosis not present

## 2016-03-08 DIAGNOSIS — I272 Other secondary pulmonary hypertension: Secondary | ICD-10-CM | POA: Diagnosis not present

## 2016-03-09 DIAGNOSIS — J189 Pneumonia, unspecified organism: Secondary | ICD-10-CM | POA: Diagnosis not present

## 2016-03-09 DIAGNOSIS — E1122 Type 2 diabetes mellitus with diabetic chronic kidney disease: Secondary | ICD-10-CM | POA: Diagnosis not present

## 2016-03-09 DIAGNOSIS — N183 Chronic kidney disease, stage 3 (moderate): Secondary | ICD-10-CM | POA: Diagnosis not present

## 2016-03-09 DIAGNOSIS — I5032 Chronic diastolic (congestive) heart failure: Secondary | ICD-10-CM | POA: Diagnosis not present

## 2016-03-09 DIAGNOSIS — I272 Other secondary pulmonary hypertension: Secondary | ICD-10-CM | POA: Diagnosis not present

## 2016-03-09 DIAGNOSIS — K5730 Diverticulosis of large intestine without perforation or abscess without bleeding: Secondary | ICD-10-CM | POA: Diagnosis not present

## 2016-03-09 DIAGNOSIS — K219 Gastro-esophageal reflux disease without esophagitis: Secondary | ICD-10-CM | POA: Diagnosis not present

## 2016-03-09 DIAGNOSIS — E785 Hyperlipidemia, unspecified: Secondary | ICD-10-CM | POA: Diagnosis not present

## 2016-03-09 DIAGNOSIS — M170 Bilateral primary osteoarthritis of knee: Secondary | ICD-10-CM | POA: Diagnosis not present

## 2016-03-09 DIAGNOSIS — K50019 Crohn's disease of small intestine with unspecified complications: Secondary | ICD-10-CM | POA: Diagnosis not present

## 2016-03-09 DIAGNOSIS — I110 Hypertensive heart disease with heart failure: Secondary | ICD-10-CM | POA: Diagnosis not present

## 2016-03-09 DIAGNOSIS — K449 Diaphragmatic hernia without obstruction or gangrene: Secondary | ICD-10-CM | POA: Diagnosis not present

## 2016-03-09 DIAGNOSIS — I2510 Atherosclerotic heart disease of native coronary artery without angina pectoris: Secondary | ICD-10-CM | POA: Diagnosis not present

## 2016-03-10 DIAGNOSIS — I110 Hypertensive heart disease with heart failure: Secondary | ICD-10-CM | POA: Diagnosis not present

## 2016-03-10 DIAGNOSIS — K50019 Crohn's disease of small intestine with unspecified complications: Secondary | ICD-10-CM | POA: Diagnosis not present

## 2016-03-10 DIAGNOSIS — J189 Pneumonia, unspecified organism: Secondary | ICD-10-CM | POA: Diagnosis not present

## 2016-03-10 DIAGNOSIS — I272 Other secondary pulmonary hypertension: Secondary | ICD-10-CM | POA: Diagnosis not present

## 2016-03-10 DIAGNOSIS — K219 Gastro-esophageal reflux disease without esophagitis: Secondary | ICD-10-CM | POA: Diagnosis not present

## 2016-03-10 DIAGNOSIS — I5032 Chronic diastolic (congestive) heart failure: Secondary | ICD-10-CM | POA: Diagnosis not present

## 2016-03-10 DIAGNOSIS — E785 Hyperlipidemia, unspecified: Secondary | ICD-10-CM | POA: Diagnosis not present

## 2016-03-10 DIAGNOSIS — N183 Chronic kidney disease, stage 3 (moderate): Secondary | ICD-10-CM | POA: Diagnosis not present

## 2016-03-10 DIAGNOSIS — E1122 Type 2 diabetes mellitus with diabetic chronic kidney disease: Secondary | ICD-10-CM | POA: Diagnosis not present

## 2016-03-10 DIAGNOSIS — K449 Diaphragmatic hernia without obstruction or gangrene: Secondary | ICD-10-CM | POA: Diagnosis not present

## 2016-03-10 DIAGNOSIS — I2510 Atherosclerotic heart disease of native coronary artery without angina pectoris: Secondary | ICD-10-CM | POA: Diagnosis not present

## 2016-03-10 DIAGNOSIS — K5730 Diverticulosis of large intestine without perforation or abscess without bleeding: Secondary | ICD-10-CM | POA: Diagnosis not present

## 2016-03-10 DIAGNOSIS — M170 Bilateral primary osteoarthritis of knee: Secondary | ICD-10-CM | POA: Diagnosis not present

## 2016-03-11 ENCOUNTER — Ambulatory Visit (INDEPENDENT_AMBULATORY_CARE_PROVIDER_SITE_OTHER): Payer: Medicare Other | Admitting: Family Medicine

## 2016-03-11 ENCOUNTER — Encounter: Payer: Self-pay | Admitting: Family Medicine

## 2016-03-11 ENCOUNTER — Ambulatory Visit (INDEPENDENT_AMBULATORY_CARE_PROVIDER_SITE_OTHER): Payer: Medicare Other

## 2016-03-11 ENCOUNTER — Encounter: Payer: Medicare Other | Admitting: Cardiology

## 2016-03-11 VITALS — BP 132/70 | HR 62 | Temp 97.5°F | Ht 60.0 in | Wt 152.8 lb

## 2016-03-11 DIAGNOSIS — D649 Anemia, unspecified: Secondary | ICD-10-CM | POA: Diagnosis not present

## 2016-03-11 DIAGNOSIS — N289 Disorder of kidney and ureter, unspecified: Secondary | ICD-10-CM | POA: Diagnosis not present

## 2016-03-11 DIAGNOSIS — Z0000 Encounter for general adult medical examination without abnormal findings: Secondary | ICD-10-CM | POA: Diagnosis not present

## 2016-03-11 DIAGNOSIS — E785 Hyperlipidemia, unspecified: Secondary | ICD-10-CM

## 2016-03-11 DIAGNOSIS — I10 Essential (primary) hypertension: Secondary | ICD-10-CM | POA: Diagnosis not present

## 2016-03-11 DIAGNOSIS — I5032 Chronic diastolic (congestive) heart failure: Secondary | ICD-10-CM

## 2016-03-11 DIAGNOSIS — E559 Vitamin D deficiency, unspecified: Secondary | ICD-10-CM

## 2016-03-11 DIAGNOSIS — R739 Hyperglycemia, unspecified: Secondary | ICD-10-CM

## 2016-03-11 NOTE — Progress Notes (Signed)
Pre visit review using our clinic review tool, if applicable. No additional management support is needed unless otherwise documented below in the visit note. 

## 2016-03-11 NOTE — Progress Notes (Signed)
Subjective:   Ellen Peterson is a 82 y.o. female who presents for Medicare Annual (Subsequent) preventive examination.  Cardiac Risk Factors include: advanced age (>68men, >35 women);diabetes mellitus;hypertension;sedentary lifestyle     Objective:     Vitals: BP 132/70 mmHg  Pulse 62  Temp(Src) 97.5 F (36.4 C) (Oral)  Ht 5' (1.524 m)  Wt 152 lb 12 oz (69.287 kg)  BMI 29.83 kg/m2  SpO2 99%  Body mass index is 29.83 kg/(m^2).   Tobacco History  Smoking status  . Never Smoker   Smokeless tobacco  . Never Used     Counseling given: Not Answered   Past Medical History  Diagnosis Date  . Diabetes mellitus, type 2 ([place])   . Diverticulosis of colon   . GERD (gastroesophageal reflux disease)   . Hyperlipidemia   . Hypertension   . OA (osteoarthritis)   . Peripheral vascular disease ([place])   . PMR (polymyalgia rheumatica) (H[facility])   . Frequent UTI   . Chest tightness     catheterization, 2005, normal coronaries  . Wide-complex tachycardia (H[facility])     SVT in past  . Hypokalemia   . LBBB (left bundle branch block)     Since at least 2005  . Ejection fraction < 50%     EF 40% echo, 09/2009  . Complication of anesthesia   . PONV (postoperative nausea and vomiting)   . PE (pulmonary embolism) 06/16/2014  . Shortness of breath   . Recurrent UTI   . Enteritis 11/2015   Past Surgical History  Procedure Laterality Date  . Appendectomy  1993  . Cholecystectomy    . Colectomy  1993    partial  . Total abdominal hysterectomy  1960s    fibroids   . Vein ligation and stripping    . Bladder tack up    . Back surgery    . Esophagogastroduodenoscopy  05/2004    baretts esophagus, stomach polyps  . Bilateral pyelonephritis    . Cardiac catheterization  2005    no significant CAD  . Tonsillectomy    . Left heart catheterization with coronary angiogram N/A 06/12/2014    Procedure: LEFT HEART CATHETERIZATION WITH CORONARY ANGIOGRAM;  Surgeon: Peter M Martinique, MD;  Location: [facility]  CATH LAB;  Service: Cardiovascular;  Laterality: N/A;  . [place]  . Enteroscopy N/A 11/29/2015    Procedure: ENTEROSCOPY;  Surgeon: Ladene Artist, MD;  Location: [facility] ENDOSCOPY;  Service: Endoscopy;  Laterality: N/A;   Family History  Problem Relation Age of Onset  . CAD Mother   . Hypertension Mother   . Stroke Mother   . Pancreatic cancer Sister   . Diabetes Brother   . Heart Problems Brother   . Heart attack Mother   . Heart attack Brother    History  Sexual Activity  . Sexual Activity: No    Outpatient Encounter Prescriptions as of 03/11/2016  Medication Sig  . acetaminophen (TYLENOL) 500 MG tablet Take 500 mg by mouth every 6 (six) hours as needed (pain).   . cholecalciferol (VITAMIN D) 1000 UNITS tablet Take 1,000 Units by mouth daily.    Marland Kitchen CINNAMON PO Take 1,000 mg by mouth daily.  . diclofenac sodium (VOLTAREN) 1 % GEL Apply 2 g topically 2 (two) times daily as needed (knee pain).  . hydrochlorothiazide (HYDRODIURIL) 25 MG tablet Take 1 tablet (25 mg total) by mouth daily.  . isosorbide mononitrate (IMDUR) 30 MG 24 hr tablet TAKE 1 TABLET  BY MOUTH DAILY (Patient taking differently: TAKE 0.5 TABLET (15 mg) BY MOUTH BID)  . losartan (COZAAR) 50 MG tablet Take 1 tablet (50 mg total) by mouth daily.  . metoprolol tartrate (LOPRESSOR) 25 MG tablet TAKE 1 TABLET BY MOUTH TWICE A DAY  . Multiple Vitamins-Minerals (ICAPS PO) Take 1 capsule by mouth daily.  . Multiple Vitamins-Minerals (MULTIVITAMIN & MINERAL PO) Take 1 tablet by mouth daily.  . nitroGLYCERIN (NITROSTAT) 0.4 MG SL tablet Place 1 tablet (0.4 mg total) under the tongue every 5 (five) minutes as needed for chest pain. As directed and as needed.  . Omega-3 Fatty Acids (FISH OIL) 1000 MG CAPS Take 1,000 mg by mouth daily.  . pantoprazole (PROTONIX) 40 MG tablet Take 1 tablet (40 mg total) by mouth 2 (two) times daily.  . rivaroxaban (XARELTO) 15 MG TABS tablet Take 1 tablet (15 mg total) by mouth daily with  supper. Starting 07/08/14  . traMADol (ULTRAM) 50 MG tablet TAKE 1 TABLET EVERY EIGHT HOURS AS NEEDED FOR PAIN  . vitamin E 400 UNIT capsule Take 400 Units by mouth daily.     No facility-administered encounter medications on file as of 03/11/2016.    Activities of Daily Living In your present state of health, do you have any difficulty performing the following activities: 03/11/2016 11/27/2015  Hearing? [place]? Y -  Difficulty concentrating or making decisions? Y -  Walking or climbing stairs? Y -  Dressing or bathing? Y -  Doing errands, shopping? [place]  Preparing Food and eating ? Y -  Using the Toilet? N -  In the past six months, have you accidently leaked urine? Y -  Do you have problems with loss of bowel control? Y -  Managing your Medications? Y -  Managing your Finances? Y -  Housekeeping or managing your Housekeeping? Y -    Patient Care Team: Abner Greenspan, MD as PCP - General      Assessment:     Exercise Activities and Dietary recommendations Current Exercise Habits: The patient does not participate in regular exercise at present, Exercise limited by: orthopedic condition(s)  Fall Risk Fall Risk  03/11/2016 03/11/2016 01/06/2015 08/07/2013  Falls in the past year? Yes Yes Yes Yes  Number falls in past yr: 2 or more 2 or more 1 1  Injury with Fall? No No No -  Risk for fall due to : History of fall(s);Impaired balance/gait;Impaired mobility;Impaired vision Impaired balance/gait;Impaired mobility;Impaired vision - Impaired balance/gait;Impaired mobility  Follow up Falls evaluation completed;Education provided Falls evaluation completed - -   Depression Screen PHQ 2/9 Scores 03/11/2016 03/11/2016 01/06/2015  PHQ - 2 Score 1 1 0     Cognitive Testing MMSE - Mini Mental State Exam 03/11/2016  Orientation to time 5  Orientation to Place 5  Registration 3  Attention/ Calculation 0  Recall 3  Language- name 2 objects 0  Language- repeat 1  Language- follow 3 step  command 3  Language- read & follow direction 0  Write a sentence 0  Copy design 0  Total score 20   PLEASE NOTE: A Mini-Cog screen was completed. Maximum score is 20. A value of 0 denotes this part of [place] MMSE was not completed.  Orientation to Time - Max 5 Orientation to Place - Max 5 Registration - Max 3 Recall - Max 3 Language Repeat - Max 1 Language Follow 3 Step Command - Max 3  Immunization History  Administered Date(s) Administered  .  Influenza Split 10/20/2011, 10/25/2012  . Influenza Whole 10/03/2006, 09/10/2008, 09/29/2009, 09/03/2010  . Influenza,inj,Quad PF,36+ Mos 09/19/2013, 09/24/2014, 09/25/2015  . Influenza-Unspecified 12/01/2015  . PPD Test 12/01/2015  . Pneumococcal Conjugate-13 01/06/2015  . Pneumococcal Polysaccharide-23 12/20/1999  . Pneumococcal-Unspecified 12/01/2015  . Td 12/19/1992  . Tdap 01/06/2015  . Zoster 02/12/2015   Screening Tests Health Maintenance  Topic Date Due  . MAMMOGRAM  01/21/2024 (Originally 10/18/2005)  . OPHTHALMOLOGY EXAM  02/17/2024 (Originally 01/19/2011)  . DEXA SCAN  02/17/2024 (Originally 11/14/1987)  . INFLUENZA VACCINE  07/19/2016  . HEMOGLOBIN A1C  09/04/2016  . FOOT EXAM  03/11/2017  . TETANUS/TDAP  01/06/2025  . ZOSTAVAX  Completed  . PNA vac Low Risk Adult  Completed      Plan:      I have personally reviewed and addressed the Medicare Annual Wellness questionnaire and have noted the following in the patient's chart:  A. Medical and social history B. Use of alcohol, tobacco or illicit drugs  C. Current medications and supplements D. Functional ability and status E.  Nutritional status F.  Physical activity G. Advance directives H. List of other physicians I.  Hospitalizations, surgeries, and ER visits in previous 12 months J.  [place] to include hearing, vision, cognitive, depression L. Referrals and appointments - none  In addition, I have reviewed and discussed with patient certain  preventive protocols, quality metrics, and best practice recommendations. A written personalized care plan for preventive services as well as general preventive health recommendations were provided to patient.  See attached scanned questionnaire for additional information.   Signed,   Lindell Noe, MHA, BS, LPN Health Advisor [phone]

## 2016-03-11 NOTE — Progress Notes (Signed)
Subjective:    Patient ID: Ellen Peterson, female    DOB: [birth year], 82 y.o.   MRN: [id number]  HPI Here for health maintenance exam and to review chronic medical problems    Had recovered from pneumonia - feeling much better   Had AMW visit with Katha Cabal -reviewed Does not hear well / does not want hearing aides or eval further  Declines further opth exams   Wt is stable with bmi of 29  Counseled on fall prevention Using a walker  No new fractures   At her age declines mammograms, colon screen and dexas as well      Results for orders placed or performed in visit on 03/04/16  CBC with Differential/Platelet  Result Value Ref Range   WBC 7.1 4.0 - 10.5 K/uL   RBC 3.79 (L) 3.87 - 5.11 Mil/uL   Hemoglobin 11.4 (L) 12.0 - 15.0 g/dL   HCT 34.1 (L) 36.0 - 46.0 %   MCV 89.8 78.0 - 100.0 fl   MCHC 33.3 30.0 - 36.0 g/dL   RDW 16.0 (H) 11.5 - 15.5 %   Platelets 325.0 150.0 - 400.0 K/uL   Neutrophils Relative % 54.5 43.0 - 77.0 %   Lymphocytes Relative 29.8 12.0 - 46.0 %   Monocytes Relative 10.9 3.0 - 12.0 %   Eosinophils Relative 4.5 0.0 - 5.0 %   Basophils Relative 0.3 0.0 - 3.0 %   Neutro Abs 3.9 1.4 - 7.7 K/uL   Lymphs Abs 2.1 0.7 - 4.0 K/uL   Monocytes Absolute 0.8 0.1 - 1.0 K/uL   Eosinophils Absolute 0.3 0.0 - 0.7 K/uL   Basophils Absolute 0.0 0.0 - 0.1 K/uL  Comprehensive metabolic panel  Result Value Ref Range   Sodium 141 135 - 145 mEq/L   Potassium 4.3 3.5 - 5.1 mEq/L   Chloride 106 96 - 112 mEq/L   CO2 26 19 - 32 mEq/L   Glucose, Bld 105 (H) 70 - 99 mg/dL   BUN 30 (H) 6 - 23 mg/dL   Creatinine, Ser 1.26 (H) 0.40 - 1.20 mg/dL   Total Bilirubin 0.5 0.2 - 1.2 mg/dL   Alkaline Phosphatase 51 39 - 117 U/L   AST 20 0 - 37 U/L   ALT 13 0 - 35 U/L   Total Protein 6.8 6.0 - 8.3 g/dL   Albumin 3.7 3.5 - 5.2 g/dL   Calcium 9.6 8.4 - 10.5 mg/dL   GFR 42.09 (L) >60.00 mL/min  Hemoglobin A1c  Result Value Ref Range   Hgb A1c MFr Bld 6.4 4.6 - 6.5 %  Lipid panel    Result Value Ref Range   Cholesterol 197 0 - 200 mg/dL   Triglycerides 140.0 0.0 - 149.0 mg/dL   HDL 51.80 >39.00 mg/dL   VLDL 28.0 0.0 - 40.0 mg/dL   LDL Cholesterol 118 (H) 0 - 99 mg/dL   Total CHOL/HDL Ratio 4    NonHDL 145.58   TSH  Result Value Ref Range   TSH 1.89 0.35 - 4.50 uIU/mL  VITAMIN D 25 Hydroxy (Vit-D Deficiency, Fractures)  Result Value Ref Range   VITD 37.56 30.00 - 100.00 ng/mL    Cr is up to 1.26-reanal insufficiency  Family enc to drink  Her cardiologist did not want her to excessively drink fluids/ but she does not have a specific fluid restriction   Hyperglycemic - no change in diet A1C is up from 6.1 to 6.4  Eating more grapes  Also has been sick  multiple times incl pneumonia   Vit D is in the nl range  Will get outside more in the summer -this will go up  Still taking her vit D  Lab Results  Component Value Date   CHOL 197 03/04/2016   CHOL 236* 07/07/2015   CHOL 237* 12/30/2014   Lab Results  Component Value Date   HDL 51.80 03/04/2016   HDL 69.40 07/07/2015   HDL 53.40 12/30/2014   Lab Results  Component Value Date   LDLCALC 118* 03/04/2016   LDLCALC 142* 07/07/2015   LDLCALC 160* 12/30/2014   Lab Results  Component Value Date   TRIG 140.0 03/04/2016   TRIG 121.0 07/07/2015   TRIG 116.0 12/30/2014   Lab Results  Component Value Date   CHOLHDL 4 03/04/2016   CHOLHDL 3 07/07/2015   CHOLHDL 4 12/30/2014   Lab Results  Component Value Date   LDLDIRECT 149.0 07/31/2013   LDLDIRECT 142.2 02/04/2013   LDLDIRECT 130.0 03/25/2010   LDL is down from 142 to 118 HDL down a bit - less active/has been sick       Review of Systems     Objective:   Physical Exam        Assessment & Plan:

## 2016-03-11 NOTE — Patient Instructions (Signed)
Continue to encourage some water/fluids (update me if cough worsens with drinking, though)  Labs are stable but Cr is up at 1.2  Watch out for excess sugar in diet  Keep follow up with cardiology   Follow up in 6 months with labs prior (if possible)

## 2016-03-11 NOTE — Progress Notes (Signed)
   Subjective:    Patient ID: Ellen Peterson, female    DOB: 02/28/1920, 82 y.o.   MRN: [id number]  HPI    Review of Systems     Objective:   Physical Exam        Assessment & Plan:  I reviewed health advisor's note, was available for consultation, and agree with documentation and plan.

## 2016-03-11 NOTE — Patient Instructions (Addendum)
[name] , Thank you for taking time to come for your Medicare Wellness Visit. I appreciate your ongoing commitment to your health goals. Please review the following plan we discussed and let me know if I can assist you in the future.    This is a list of the screening recommended for you and due dates:  Health Maintenance  Topic Date Due  . Mammogram  01/21/2024*  . Eye exam for diabetics  02/17/2024*  . DEXA scan (bone density measurement)  02/17/2024*  . Flu Shot  07/19/2016  . Hemoglobin A1C  09/04/2016  . Complete foot exam   03/11/2017  . Tetanus Vaccine  01/06/2025  . Shingles Vaccine  Completed  . Pneumonia vaccines  Completed  *Topic was postponed. The date shown is not the original due date.    Preventive Care for Adults  A healthy lifestyle and preventive care can promote health and wellness. Preventive health guidelines for adults include the following key practices.  . A routine yearly physical is a good way to check with your health care provider about your health and preventive screening. It is a chance to share any concerns and updates on your health and to receive a thorough exam.  . Visit your dentist for a routine exam and preventive care every 6 months. Brush your teeth twice a day and floss once a day. Good oral hygiene prevents tooth decay and gum disease.  . The frequency of eye exams is based on your age, health, family medical history, use  of contact lenses, and other factors. Follow your health care provider's ecommendations for frequency of eye exams.  . Eat a healthy diet. Foods like vegetables, fruits, whole grains, low-fat dairy products, and lean protein foods contain the nutrients you need without too many calories. Decrease your intake of foods high in solid fats, added sugars, and salt. Eat the right amount of calories for you. Get information about a proper diet from your health care provider, if necessary.  . Regular physical exercise is one of  the most important things you can do for your health. Most adults should get at least 150 minutes of moderate-intensity exercise (any activity that increases your heart rate and causes you to sweat) each week. In addition, most adults need muscle-strengthening exercises on 2 or more days a week.  Silver Sneakers may be a benefit available to you. To determine eligibility, you may visit the website: www.silversneakers.com or contact program at 831-778-3559 Mon-Fri between 8AM-8PM.   . Maintain a healthy weight. The body mass index (BMI) is a screening tool to identify possible weight problems. It provides an estimate of body fat based on height and weight. Your health care provider can find your BMI and can help you achieve or maintain a healthy weight.   For adults 20 years and older: ? A BMI below 18.5 is considered underweight. ? A BMI of 18.5 to 24.9 is normal. ? A BMI of 25 to 29.9 is considered overweight. ? A BMI of 30 and above is considered obese.   . Maintain normal blood lipids and cholesterol levels by exercising and minimizing your intake of saturated fat. Eat a balanced diet with plenty of fruit and vegetables. Blood tests for lipids and cholesterol should begin at age 43 and be repeated every 5 years. If your lipid or cholesterol levels are high, you are over 50, or you are at high risk for heart disease, you may need your cholesterol levels checked more frequently. Ongoing  high lipid and cholesterol levels should be treated with medicines if diet and exercise are not working.  . If you smoke, find out from your health care provider how to quit. If you do not use tobacco, please do not start.  . If you choose to drink alcohol, please do not consume more than 2 drinks per day. One drink is considered to be 12 ounces (355 mL) of beer, 5 ounces (148 mL) of wine, or 1.5 ounces (44 mL) of liquor.  . If you are 50-68 years old, ask your health care provider if you should take aspirin to  prevent strokes.  . Use sunscreen. Apply sunscreen liberally and repeatedly throughout the day. You should seek shade when your shadow is shorter than you. Protect yourself by wearing long sleeves, pants, a wide-brimmed hat, and sunglasses year round, whenever you are outdoors.  . Once a month, do a whole body skin exam, using a mirror to look at the skin on your back. Tell your health care provider of new moles, moles that have irregular borders, moles that are larger than a pencil eraser, or moles that have changed in shape or color.     Fall Prevention in the Home  Falls can cause injuries. They can happen to people of all ages. There are many things you can do to make your home safe and to help prevent falls.  WHAT CAN I DO ON THE OUTSIDE OF MY HOME?  Regularly fix the edges of walkways and driveways and fix any cracks.  Remove anything that might make you trip as you walk through a door, such as a raised step or threshold.  Trim any bushes or trees on the path to your home.  Use bright outdoor lighting.  Clear any walking paths of anything that might make someone trip, such as rocks or tools.  Regularly check to see if handrails are loose or broken. Make sure that both sides of any steps have handrails.  Any raised decks and porches should have guardrails on the edges.  Have any leaves, snow, or ice cleared regularly.  Use sand or salt on walking paths during winter.  Clean up any spills in your garage right away. This includes oil or grease spills. WHAT CAN I DO IN THE BATHROOM?   Use night lights.  Install grab bars by the toilet and in the tub and shower. Do not use towel bars as grab bars.  Use non-skid mats or decals in the tub or shower.  If you need to sit down in the shower, use a plastic, non-slip stool.  Keep the floor dry. Clean up any water that spills on the floor as soon as it happens.  Remove soap buildup in the tub or shower regularly.  Attach bath  mats securely with double-sided non-slip rug tape.  Do not have throw rugs and other things on the floor that can make you trip. WHAT CAN I DO IN THE BEDROOM?  Use night lights.  Make sure that you have a light by your bed that is easy to reach.  Do not use any sheets or blankets that are too big for your bed. They should not hang down onto the floor.  Have a firm chair that has side arms. You can use this for support while you get dressed.  Do not have throw rugs and other things on the floor that can make you trip. WHAT CAN I DO IN THE KITCHEN?  Clean up any spills  right away.  Avoid walking on wet floors.  Keep items that you use a lot in easy-to-reach places.  If you need to reach something above you, use a strong step stool that has a grab bar.  Keep electrical cords out of the way.  Do not use floor polish or wax that makes floors slippery. If you must use wax, use non-skid floor wax.  Do not have throw rugs and other things on the floor that can make you trip. WHAT CAN I DO WITH MY STAIRS?  Do not leave any items on the stairs.  Make sure that there are handrails on both sides of the stairs and use them. Fix handrails that are broken or loose. Make sure that handrails are as long as the stairways.  Check any carpeting to make sure that it is firmly attached to the stairs. Fix any carpet that is loose or worn.  Avoid having throw rugs at the top or bottom of the stairs. If you do have throw rugs, attach them to the floor with carpet tape.  Make sure that you have a light switch at the top of the stairs and the bottom of the stairs. If you do not have them, ask someone to add them for you. WHAT ELSE CAN I DO TO HELP PREVENT FALLS?  Wear shoes that:  Do not have high heels.  Have rubber bottoms.  Are comfortable and fit you well.  Are closed at the toe. Do not wear sandals.  If you use a stepladder:  Make sure that it is fully opened. Do not climb a closed  stepladder.  Make sure that both sides of the stepladder are locked into place.  Ask someone to hold it for you, if possible.  Clearly mark and make sure that you can see:  Any grab bars or handrails.  First and last steps.  Where the edge of each step is.  Use tools that help you move around (mobility aids) if they are needed. These include:  Canes.  Walkers.  Scooters.  Crutches.  Turn on the lights when you go into a dark area. Replace any light bulbs as soon as they burn out.  Set up your furniture so you have a clear path. Avoid moving your furniture around.  If any of your floors are uneven, fix them.  If there are any pets around you, be aware of where they are.  Review your medicines with your doctor. Some medicines can make you feel dizzy. This can increase your chance of falling. Ask your doctor what other things that you can do to help prevent falls.   This information is not intended to replace advice given to you by your health care provider. Make sure you discuss any questions you have with your health care provider.   Document Released: 10/01/2009 Document Revised: 04/21/2015 Document Reviewed: 01/09/2015 Elsevier Interactive Patient Education [employer].

## 2016-03-12 NOTE — Assessment & Plan Note (Signed)
Lab Results  Component Value Date   HGBA1C 6.4 03/04/2016   This is up from 6.1  Has eaten more fruit Disc portion control for carbs  Activity as tolerated  Continue to follow  Re check 85mo

## 2016-03-12 NOTE — Assessment & Plan Note (Signed)
Intol to statin and does not want to try another therapy Improved today however Disc goals for lipids and reasons to control them Rev labs with pt Rev low sat fat diet in detail

## 2016-03-12 NOTE — Assessment & Plan Note (Signed)
bp in fair control at this time  BP Readings from Last 1 Encounters:  03/11/16 132/70   No changes needed Disc lifstyle change with low sodium diet and exercise  Labs reviewed

## 2016-03-12 NOTE — Assessment & Plan Note (Signed)
Overall stable Suspect anemia of chronic dz plus/minus renal insuff She does not want aggressive w/u  Will continue to follow

## 2016-03-12 NOTE — Assessment & Plan Note (Signed)
Vitamin D level is therapeutic with current supplementation- level in 30s -would like it a bit higher-pt plans on being outdoors more now  Disc importance of this to bone and overall health

## 2016-03-12 NOTE — Assessment & Plan Note (Addendum)
Reviewed health habits including diet and exercise and skin cancer prevention Reviewed appropriate screening tests for age  Also reviewed health mt list, fam hx and immunization status , as well as social and family history   See [facility] Labs reviewed Pt declines further hearing or vision eval or tx/ cancer screening or dexa  Continue to encourage some water/fluids (update me if cough worsens with drinking, though)  Labs are stable but Cr is up at 1.2  Watch out for excess sugar in diet  Keep follow up with cardiology

## 2016-03-12 NOTE — Assessment & Plan Note (Signed)
No change since prior visit  Is able to do a bit more  Needs more fluids for renal health but careful to avoid fluid overload

## 2016-03-12 NOTE — Assessment & Plan Note (Signed)
CKD stage 3  Cr is up slt since returning home - is getting less fluids  Disc with family -not on fluid restriction but want to avoid overload  Will inc water intake 1-2 servings per day Watch for s/s of uti also

## 2016-03-14 DIAGNOSIS — I110 Hypertensive heart disease with heart failure: Secondary | ICD-10-CM | POA: Diagnosis not present

## 2016-03-14 DIAGNOSIS — K50019 Crohn's disease of small intestine with unspecified complications: Secondary | ICD-10-CM

## 2016-03-14 DIAGNOSIS — J189 Pneumonia, unspecified organism: Secondary | ICD-10-CM | POA: Diagnosis not present

## 2016-03-14 DIAGNOSIS — E1122 Type 2 diabetes mellitus with diabetic chronic kidney disease: Secondary | ICD-10-CM | POA: Diagnosis not present

## 2016-03-14 DIAGNOSIS — N183 Chronic kidney disease, stage 3 (moderate): Secondary | ICD-10-CM

## 2016-03-14 DIAGNOSIS — I5032 Chronic diastolic (congestive) heart failure: Secondary | ICD-10-CM | POA: Diagnosis not present

## 2016-03-15 ENCOUNTER — Telehealth: Payer: Self-pay | Admitting: *Deleted

## 2016-03-15 MED ORDER — VOLTAREN 1 % TD GEL: 2.0000 g | Freq: Two times a day (BID) | TRANSDERMAL | Status: AC | PRN

## 2016-03-15 NOTE — Telephone Encounter (Signed)
Pt needed name brand due to insurance. done

## 2016-03-17 ENCOUNTER — Encounter: Payer: Self-pay | Admitting: Cardiology

## 2016-03-17 ENCOUNTER — Ambulatory Visit (INDEPENDENT_AMBULATORY_CARE_PROVIDER_SITE_OTHER): Payer: Medicare Other | Admitting: Cardiology

## 2016-03-17 VITALS — BP 162/68 | HR 60 | Ht 60.0 in | Wt 151.1 lb

## 2016-03-17 DIAGNOSIS — I739 Peripheral vascular disease, unspecified: Secondary | ICD-10-CM

## 2016-03-17 DIAGNOSIS — I5032 Chronic diastolic (congestive) heart failure: Secondary | ICD-10-CM

## 2016-03-17 DIAGNOSIS — I472 Ventricular tachycardia: Secondary | ICD-10-CM

## 2016-03-17 DIAGNOSIS — I519 Heart disease, unspecified: Secondary | ICD-10-CM

## 2016-03-17 DIAGNOSIS — I5189 Other ill-defined heart diseases: Secondary | ICD-10-CM

## 2016-03-17 DIAGNOSIS — I272 Other secondary pulmonary hypertension: Secondary | ICD-10-CM | POA: Diagnosis not present

## 2016-03-17 DIAGNOSIS — I10 Essential (primary) hypertension: Secondary | ICD-10-CM

## 2016-03-17 DIAGNOSIS — R000 Tachycardia, unspecified: Secondary | ICD-10-CM

## 2016-03-17 MED ORDER — FUROSEMIDE 20 MG PO TABS: 20.0000 mg | ORAL_TABLET | Freq: Every day | ORAL | Status: AC | PRN

## 2016-03-17 NOTE — Patient Instructions (Signed)
Medication Instructions:   START TAKING LASIX 20 MG ONCE DAILY AS NEEDED FOR LOWER EXTREMITY EDEMA    Follow-Up:  Your physician wants you to follow-up in: [place] will receive a reminder letter in the mail two months in advance. If you don't receive a letter, please call our office to schedule the follow-up appointment.      If you need a refill on your cardiac medications before your next appointment, please call your pharmacy.

## 2016-03-17 NOTE — Progress Notes (Signed)
Patient ID: Ellen Peterson, female   DOB: 1919/12/24, 82 y.o.   MRN: [id number]      Cardiology Office Note  Date:  03/17/2016   ID:  Ellen Peterson, DOB 02-Dec-1920, MRN [id number]  PCP:  [name], MD  Cardiologist:  Dorothy Spark, MD   Chief Complaint  Patient presents with  . Follow-up    pt states no chest pain  . Shortness of Breath    more at night    History of Present Illness:  Ellen Peterson is a 82 y.o. female who presents today to follow-up her shortness of breath. She is here with her 2 daughters. She does have exertional shortness of breath. She does not have PND or orthopnea. She has some mild edema that increases during the day. She is drinking extra fluid as recommended by her physicians treating her recurrent urinary tract infection.  The patient had presented to the hospital in June, 2015 with shortness of breath. Initially, it was felt that her symptoms might be cardiac. Catheterization was done. It showed no significant treatable disease and she went home. She then came back to the emergency room within 24 hours. Diagnosis was made of marked pulmonary emboli. The patient did well. The patient's daughters have a complete understanding of the events. Initially she had right ventricular dysfunction. This is improved. She has continued on anticoagulation. I have adjusted the dose of her is her Xarelto. The plan would be to continue this indefinitely.  03/17/2016  - the patient is coming after 6 months, she now lives with one of her daughters was also present. The patient walks limited distances with a walker and gets quite short of breath with that. She doesn't for short of breath at rest, sleeps in horizontal position without any shortness of breath. She has mild chronic lower extremity edema. No chest pain no palpitations or syncope no bleeding with Xarelto.   Past Medical History  Diagnosis Date  . Diabetes mellitus, type 2 ([place])   . Diverticulosis of colon   . GERD  (gastroesophageal reflux disease)   . Hyperlipidemia   . Hypertension   . OA (osteoarthritis)   . Peripheral vascular disease ([place])   . PMR (polymyalgia rheumatica) ([facility])   . Frequent UTI   . Chest tightness     catheterization, 2005, normal coronaries  . Wide-complex tachycardia ([facility])     SVT in past  . Hypokalemia   . LBBB (left bundle branch block)     Since at least 2005  . Ejection fraction < 50%     EF 40% echo, 09/2009  . Complication of anesthesia   . PONV (postoperative nausea and vomiting)   . PE (pulmonary embolism) 06/16/2014  . Shortness of breath   . Recurrent UTI   . Enteritis 11/2015   Past Surgical History  Procedure Laterality Date  . Appendectomy  1993  . Cholecystectomy    . Colectomy  1993    partial  . Total abdominal hysterectomy  1960s    fibroids   . Vein ligation and stripping    . Bladder tack up    . Back surgery    . Esophagogastroduodenoscopy  05/2004    baretts esophagus, stomach polyps  . Bilateral pyelonephritis    . Cardiac catheterization  2005    no significant CAD  . Tonsillectomy    . Left heart catheterization with coronary angiogram N/A 06/12/2014    Procedure: LEFT HEART CATHETERIZATION WITH CORONARY ANGIOGRAM;  Surgeon: Peter M Martinique, MD;  Location: [facility] CATH LAB;  Service: Cardiovascular;  Laterality: N/A;  . [place]  . Enteroscopy N/A 11/29/2015    Procedure: ENTEROSCOPY;  Surgeon: Ladene Artist, MD;  Location: [facility] ENDOSCOPY;  Service: Endoscopy;  Laterality: N/A;   Patient Active Problem List   Diagnosis Date Noted  . Routine general medical examination at a health care facility 03/11/2016  . Right upper lobe pneumonia 02/22/2016  . Mobility impaired 01/28/2016  . Osteoarthritis of both knees 01/28/2016  . Cognitive decline 01/28/2016  . CKD (chronic kidney disease), stage III 11/27/2015  . Chronic diastolic heart failure ([place]) 11/27/2015  . Abdominal pain, left lower quadrant 11/11/2015  . Left groin pain  11/11/2015  . History of DVT (deep vein thrombosis) 07/23/2014  . CAD (coronary artery disease), native coronary artery 07/23/2014  . Pulmonary hypertension ([place]) 07/23/2014  . Right ventricular dysfunction 07/23/2014  . HX: anticoagulation 07/23/2014  . Dysphasia 07/23/2014  . Ejection fraction   . Essential hypertension, benign 06/22/2014  . GERD (gastroesophageal reflux disease) 06/22/2014  . Physical deconditioning 06/22/2014  . History of pulmonary embolism 06/15/2014  . Encounter for Medicare annual wellness exam 08/08/2013  . LBBB (left bundle branch block)   . Hyperlipidemia   . Wide-complex tachycardia ([place])   . Venous insufficiency 06/03/2011  . Hyperglycemia 11/17/2010  . Anemia 02/08/2010  . Renal insufficiency 10/27/2009  . Recurrent UTI 09/22/2008  . Vitamin D deficiency 09/10/2008  . COLONIC POLYPS, ADENOMATOUS 04/25/2007  . PERIPHERAL VASCULAR DISEASE 04/25/2007  . DIVERTICULOSIS, COLON 04/25/2007  . IBS 04/25/2007  . Osteoarthritis 04/25/2007  . Polymyalgia rheumatica ([place]) 04/25/2007      Current Outpatient Prescriptions  Medication Sig Dispense Refill  . acetaminophen (TYLENOL) 500 MG tablet Take 500 mg by mouth every 6 (six) hours as needed (pain).     . cholecalciferol (VITAMIN D) 1000 UNITS tablet Take 1,000 Units by mouth daily.      Marland Kitchen CINNAMON PO Take 1,000 mg by mouth daily.    . hydrochlorothiazide (HYDRODIURIL) 25 MG tablet Take 1 tablet (25 mg total) by mouth daily. 90 tablet 3  . isosorbide mononitrate (IMDUR) 30 MG 24 hr tablet TAKE 1 TABLET BY MOUTH DAILY (Patient taking differently: TAKE 0.5 TABLET (15 mg) BY MOUTH BID) 30 tablet 11  . losartan (COZAAR) 50 MG tablet Take 1 tablet (50 mg total) by mouth daily. 90 tablet 3  . metoprolol tartrate (LOPRESSOR) 25 MG tablet TAKE 1 TABLET BY MOUTH TWICE A DAY 60 tablet 11  . Multiple Vitamins-Minerals (ICAPS PO) Take 1 capsule by mouth daily.    . Multiple Vitamins-Minerals (MULTIVITAMIN & MINERAL PO)  Take 1 tablet by mouth daily.    . nitroGLYCERIN (NITROSTAT) 0.4 MG SL tablet Place 1 tablet (0.4 mg total) under the tongue every 5 (five) minutes as needed for chest pain. As directed and as needed. 10 tablet 3  . Omega-3 Fatty Acids (FISH OIL) 1000 MG CAPS Take 1,000 mg by mouth daily.    . pantoprazole (PROTONIX) 40 MG tablet Take 1 tablet (40 mg total) by mouth 2 (two) times daily. 180 tablet 3  . rivaroxaban (XARELTO) 15 MG TABS tablet Take 1 tablet (15 mg total) by mouth daily with supper. Starting 07/08/14 30 tablet 6  . traMADol (ULTRAM) 50 MG tablet TAKE 1 TABLET EVERY EIGHT HOURS AS NEEDED FOR PAIN 90 tablet 0  . vitamin E 400 UNIT capsule Take 400 Units by mouth daily.      Marland Kitchen  VOLTAREN 1 % GEL Apply 2 g topically 2 (two) times daily as needed (knee pain). 6 Tube 3   No current facility-administered medications for this visit.    Allergies:   Metronidazole; Nitrofurantoin; Nsaids; Rofecoxib; Simvastatin; Erythromycin; Metformin; Sulfamethoxazole-trimethoprim; and Tetracycline    Social History:  The patient  reports that she has never smoked. She has never used smokeless tobacco. She reports that she does not drink alcohol or use illicit drugs.   Family History:  The patient's family history includes CAD in her mother; Diabetes in her brother; Heart Problems in her brother; Heart attack in her brother and mother; Hypertension in her mother; Pancreatic cancer in her sister; Stroke in her mother.   ROS:  Please see the history of present illness.    Patient denies fever, chills, headache, sweats, rash, change in vision, change in hearing, chest pain, cough, nausea or vomiting, urinary symptoms. All other systems are reviewed and are negative.   PHYSICAL EXAM: VS:  BP 162/68 mmHg  Pulse 60  Ht 5' (1.524 m)  Wt 151 lb 1.9 oz (68.548 kg)  BMI 29.51 kg/m2 , The patient is oriented to person time and place. Affect is normal. She is able to walk with a rolling walker. Head is atraumatic.  Sclera and conjunctiva are normal. There is no jugular venous distention. Lungs are clear. Respiratory effort is nonlabored. Cardiac exam reveals S1 and S2. The abdomen is soft. The patient has mild peripheral edema in the right lower extremity.  EKG:   EKG is done today and reviewed by me. There is sinus rhythm. There is old left bundle branch block.  Recent Labs: 03/04/2016: ALT 13; BUN 30*; Creatinine, Ser 1.26*; Hemoglobin 11.4*; Platelets 325.0; Potassium 4.3; Sodium 141; TSH 1.89   Lipid Panel    Component Value Date/Time   CHOL 197 03/04/2016 0833   TRIG 140.0 03/04/2016 0833   HDL 51.80 03/04/2016 0833   CHOLHDL 4 03/04/2016 0833   VLDL 28.0 03/04/2016 0833   LDLCALC 118* 03/04/2016 0833   LDLDIRECT 149.0 07/31/2013 0852   Wt Readings from Last 3 Encounters:  03/17/16 151 lb 1.9 oz (68.548 kg)  03/11/16 152 lb 12 oz (69.287 kg)  03/11/16 152 lb 12 oz (69.287 kg)      ASSESSMENT AND PLAN:  1. History of pulmonary embolism- she is on chronic anticoagulation with Xarelto 50 mg daily and has no bleeding complications. We will continue for now as she also doesn't have any falls. Her most recent hemoglobin was 11.4.  2. CAD- patient is asymptomatic no ischemic workup needed right now.  3. Shortness of breath- multifactorial age, deconditioning, history of pulmonary embolism. It is stable.  4. Lower extremity edema - who prescribed Lasix 20 mg daily as needed to use on days when her legs swelling is worsening.  5. Acute kidney failure with creatinine 1.2, this was after she had pneumonia, she is advised to increase her fluid intake.  5. Anticoagulation- no bleeding as described above we will continue.  6. Hyperlipidemia - borderline LDL 118, considering her high age and mild dementia start any statin and continue just fish oil.  Follow up in 6 months.  Dorothy Spark 03/17/2016

## 2016-03-21 ENCOUNTER — Other Ambulatory Visit: Payer: Self-pay | Admitting: Family Medicine

## 2016-03-21 DIAGNOSIS — M6281 Muscle weakness (generalized): Secondary | ICD-10-CM | POA: Diagnosis not present

## 2016-03-21 DIAGNOSIS — K529 Noninfective gastroenteritis and colitis, unspecified: Secondary | ICD-10-CM | POA: Diagnosis not present

## 2016-03-21 NOTE — Telephone Encounter (Signed)
Px written for call in   

## 2016-03-21 NOTE — Telephone Encounter (Signed)
Pt had CPE on 03/11/16, last refilled on 02/17/16 #90 with 0 refills, please advise

## 2016-03-21 NOTE — Telephone Encounter (Signed)
Rx called in as prescribed 

## 2016-04-18 ENCOUNTER — Other Ambulatory Visit: Payer: Self-pay | Admitting: Family Medicine

## 2016-04-18 NOTE — Telephone Encounter (Signed)
Electronic refill request, pt had CPE on 03/11/16, last refilled on 03/21/16 #90 with 0 refills, please advise

## 2016-04-18 NOTE — Telephone Encounter (Signed)
Px written for call in   

## 2016-04-18 NOTE — Telephone Encounter (Signed)
Rx called in as prescribed 

## 2016-04-20 DIAGNOSIS — K529 Noninfective gastroenteritis and colitis, unspecified: Secondary | ICD-10-CM | POA: Diagnosis not present

## 2016-04-20 DIAGNOSIS — M6281 Muscle weakness (generalized): Secondary | ICD-10-CM | POA: Diagnosis not present

## 2016-04-22 ENCOUNTER — Telehealth: Payer: Self-pay | Admitting: *Deleted

## 2016-04-22 NOTE — Telephone Encounter (Signed)
Samples of Xarelto 20mg  provided to patient. P A Forms also enclosed with samples.

## 2016-04-22 NOTE — Telephone Encounter (Signed)
No forms were provided at this pts last OV with Dr Meda Coffee on 03/17/16.  Pt will need Xarelto samples given, and to fill out pt assistance form again for Xarelto. Will forward this message back to refill dept and [employer], as they are aware of this and will follow-up with the pt and daughter accordingly.

## 2016-04-22 NOTE — Telephone Encounter (Signed)
Patient calling about patient assistance form that was left with Dr. Francesca Oman nurse on 03/17/2016. Xarelto is running out, daughter needs to know what to do. [phone] Ellen Peterson

## 2016-04-26 ENCOUNTER — Ambulatory Visit: Payer: Medicare Other | Admitting: Sports Medicine

## 2016-05-21 DIAGNOSIS — K529 Noninfective gastroenteritis and colitis, unspecified: Secondary | ICD-10-CM | POA: Diagnosis not present

## 2016-05-21 DIAGNOSIS — M6281 Muscle weakness (generalized): Secondary | ICD-10-CM | POA: Diagnosis not present

## 2016-05-23 ENCOUNTER — Other Ambulatory Visit: Payer: Self-pay | Admitting: Family Medicine

## 2016-05-23 NOTE — Telephone Encounter (Signed)
Routing Rx refill request to pt's cardiologist

## 2016-06-20 ENCOUNTER — Other Ambulatory Visit: Payer: Self-pay | Admitting: *Deleted

## 2016-06-20 DIAGNOSIS — K529 Noninfective gastroenteritis and colitis, unspecified: Secondary | ICD-10-CM | POA: Diagnosis not present

## 2016-06-20 DIAGNOSIS — M6281 Muscle weakness (generalized): Secondary | ICD-10-CM | POA: Diagnosis not present

## 2016-06-20 MED ORDER — METOPROLOL TARTRATE 25 MG PO TABS: 25.0000 mg | ORAL_TABLET | Freq: Two times a day (BID) | ORAL | Status: AC

## 2016-06-20 MED ORDER — ISOSORBIDE MONONITRATE ER 30 MG PO TB24
ORAL_TABLET | ORAL | Status: DC
Start: 2016-06-20 — End: 2016-09-12

## 2016-06-22 DIAGNOSIS — R300 Dysuria: Secondary | ICD-10-CM | POA: Diagnosis not present

## 2016-07-11 ENCOUNTER — Other Ambulatory Visit: Payer: Self-pay

## 2016-07-12 MED ORDER — RIVAROXABAN 15 MG PO TABS: 15.0000 mg | ORAL_TABLET | Freq: Every day | ORAL | 2 refills | Status: DC

## 2016-07-12 NOTE — Telephone Encounter (Signed)
Yes please refill this medication under Dr Meda Coffee, for she just saw this pt in 3/17 and she is not due to see Dr Meda Coffee for a 6 month follow-up until Sept or Oct.  Thanks.

## 2016-07-21 DIAGNOSIS — M6281 Muscle weakness (generalized): Secondary | ICD-10-CM | POA: Diagnosis not present

## 2016-07-21 DIAGNOSIS — K529 Noninfective gastroenteritis and colitis, unspecified: Secondary | ICD-10-CM | POA: Diagnosis not present

## 2016-08-03 ENCOUNTER — Other Ambulatory Visit: Payer: Self-pay | Admitting: *Deleted

## 2016-08-03 MED ORDER — TRAMADOL HCL 50 MG PO TABS: 50.0000 mg | ORAL_TABLET | Freq: Three times a day (TID) | ORAL | 3 refills | Status: AC | PRN

## 2016-08-03 NOTE — Telephone Encounter (Signed)
Px written for call in   

## 2016-08-03 NOTE — Telephone Encounter (Signed)
Pt had CPE on 03/11/16, last filled on 04/18/16 #90 with 3 additional refills, please advise

## 2016-08-03 NOTE — Telephone Encounter (Signed)
Rx called in as prescribed 

## 2016-08-10 ENCOUNTER — Inpatient Hospital Stay (HOSPITAL_COMMUNITY)
Admission: EM | Admit: 2016-08-10 | Discharge: 2016-08-13 | DRG: 055 | Disposition: A | Discharge status: Home Health | Payer: Medicare Other | Attending: Family Medicine | Admitting: Family Medicine

## 2016-08-10 ENCOUNTER — Encounter (HOSPITAL_COMMUNITY): Payer: Self-pay | Admitting: Emergency Medicine

## 2016-08-10 DIAGNOSIS — Z79899 Other long term (current) drug therapy: Secondary | ICD-10-CM

## 2016-08-10 DIAGNOSIS — G939 Disorder of brain, unspecified: Secondary | ICD-10-CM | POA: Diagnosis present

## 2016-08-10 DIAGNOSIS — Z7901 Long term (current) use of anticoagulants: Secondary | ICD-10-CM

## 2016-08-10 DIAGNOSIS — I447 Left bundle-branch block, unspecified: Secondary | ICD-10-CM | POA: Diagnosis present

## 2016-08-10 DIAGNOSIS — D320 Benign neoplasm of cerebral meninges: Secondary | ICD-10-CM | POA: Diagnosis present

## 2016-08-10 DIAGNOSIS — I10 Essential (primary) hypertension: Secondary | ICD-10-CM | POA: Diagnosis not present

## 2016-08-10 DIAGNOSIS — E785 Hyperlipidemia, unspecified: Secondary | ICD-10-CM | POA: Diagnosis present

## 2016-08-10 DIAGNOSIS — M353 Polymyalgia rheumatica: Secondary | ICD-10-CM | POA: Diagnosis present

## 2016-08-10 DIAGNOSIS — Z66 Do not resuscitate: Secondary | ICD-10-CM | POA: Diagnosis present

## 2016-08-10 DIAGNOSIS — N183 Chronic kidney disease, stage 3 (moderate): Secondary | ICD-10-CM | POA: Diagnosis present

## 2016-08-10 DIAGNOSIS — R519 Headache, unspecified: Secondary | ICD-10-CM

## 2016-08-10 DIAGNOSIS — I1310 Hypertensive heart and chronic kidney disease without heart failure, with stage 1 through stage 4 chronic kidney disease, or unspecified chronic kidney disease: Secondary | ICD-10-CM | POA: Diagnosis present

## 2016-08-10 DIAGNOSIS — Z888 Allergy status to other drugs, medicaments and biological substances status: Secondary | ICD-10-CM

## 2016-08-10 DIAGNOSIS — M542 Cervicalgia: Secondary | ICD-10-CM | POA: Diagnosis not present

## 2016-08-10 DIAGNOSIS — E559 Vitamin D deficiency, unspecified: Secondary | ICD-10-CM | POA: Diagnosis not present

## 2016-08-10 DIAGNOSIS — Z86010 Personal history of colonic polyps: Secondary | ICD-10-CM

## 2016-08-10 DIAGNOSIS — K219 Gastro-esophageal reflux disease without esophagitis: Secondary | ICD-10-CM | POA: Diagnosis present

## 2016-08-10 DIAGNOSIS — I2510 Atherosclerotic heart disease of native coronary artery without angina pectoris: Secondary | ICD-10-CM | POA: Diagnosis not present

## 2016-08-10 DIAGNOSIS — G9389 Other specified disorders of brain: Secondary | ICD-10-CM

## 2016-08-10 DIAGNOSIS — Z86711 Personal history of pulmonary embolism: Secondary | ICD-10-CM | POA: Diagnosis present

## 2016-08-10 DIAGNOSIS — Z8249 Family history of ischemic heart disease and other diseases of the circulatory system: Secondary | ICD-10-CM | POA: Diagnosis not present

## 2016-08-10 DIAGNOSIS — Z882 Allergy status to sulfonamides status: Secondary | ICD-10-CM | POA: Diagnosis not present

## 2016-08-10 DIAGNOSIS — R51 Headache: Secondary | ICD-10-CM | POA: Diagnosis not present

## 2016-08-10 DIAGNOSIS — R739 Hyperglycemia, unspecified: Secondary | ICD-10-CM | POA: Diagnosis not present

## 2016-08-10 MED ORDER — DIAZEPAM 2 MG PO TABS
2.0000 mg | ORAL_TABLET | Freq: Once | ORAL | Status: AC
  Administered 2016-08-10: 2 mg via ORAL
  Filled 2016-08-10: qty 1

## 2016-08-10 NOTE — ED Provider Notes (Addendum)
[facility] DEPT Provider Note   CSN: [id number] Arrival date & time: 08/10/16  1823   By signing my name below, I, Arianna Nassar, attest that this documentation has been prepared under the direction and in the presence of Merryl Hacker, MD.  Electronically Signed: Julien Nordmann, ED Scribe. 08/10/16. 11:28 PM.   History   Chief Complaint Chief Complaint  Patient presents with  . Neck Pain  . Migraine   The history is provided by the patient. No language interpreter was used.   HPI Comments: Ellen Peterson is a 82 y.o. female who has a PMhx of DM, HLD, recurrent UTI's, HTN, PE, GERD, hypokalemia, and osteoarthritis presents to the Emergency Department complaining of constant, gradual worsening, 10/10, left sided neck pain onset 4 days ago. Pt has an associated headache for the past few days. Pt notes having a "crick" in her neck Sunday morning. She describes the pain as a "knot" in her neck. Her pain is worse with movement and she has been laying with a soft pillow to relieve her neck pain when she lays down. Family denies hx of migraines. She has taken tylenol and tramadol to alleviate her pain with minimal relief. Pt says she was having trouble sleeping until she began to take her tramadol which she states made her sleep very well. She is currently on Xarelto. Denies weakness, numbness, visual changes, stroke like symptoms.  Past Medical History:  Diagnosis Date  . Chest tightness    catheterization, 2005, normal coronaries  . Complication of anesthesia   . Diabetes mellitus, type 2 ([place])   . Diverticulosis of colon   . Ejection fraction < 50%    EF 40% echo, 09/2009  . Enteritis 11/2015  . Frequent UTI   . GERD (gastroesophageal reflux disease)   . Hyperlipidemia   . Hypertension   . Hypokalemia   . LBBB (left bundle branch block)    Since at least 2005  . OA (osteoarthritis)   . PE (pulmonary embolism) 06/16/2014  . Peripheral vascular disease ([place])   . PMR  (polymyalgia rheumatica) (H[facility])   . PONV (postoperative nausea and vomiting)   . Recurrent UTI   . Shortness of breath   . Wide-complex tachycardia ([facility])    SVT in past    Patient Active Problem List   Diagnosis Date Noted  . Routine general medical examination at a health care facility 03/11/2016  . Right upper lobe pneumonia 02/22/2016  . Mobility impaired 01/28/2016  . Osteoarthritis of both knees 01/28/2016  . Cognitive decline 01/28/2016  . CKD (chronic kidney disease), stage III 11/27/2015  . Chronic diastolic heart failure ([place]) 11/27/2015  . Abdominal pain, left lower quadrant 11/11/2015  . Left groin pain 11/11/2015  . History of DVT (deep vein thrombosis) 07/23/2014  . CAD (coronary artery disease), native coronary artery 07/23/2014  . Pulmonary hypertension ([place]) 07/23/2014  . Right ventricular dysfunction 07/23/2014  . HX: anticoagulation 07/23/2014  . Dysphasia 07/23/2014  . Ejection fraction   . Essential hypertension, benign 06/22/2014  . GERD (gastroesophageal reflux disease) 06/22/2014  . Physical deconditioning 06/22/2014  . History of pulmonary embolism 06/15/2014  . Encounter for Medicare annual wellness exam 08/08/2013  . LBBB (left bundle branch block)   . Hyperlipidemia   . Wide-complex tachycardia ([place])   . Venous insufficiency 06/03/2011  . Hyperglycemia 11/17/2010  . Anemia 02/08/2010  . Renal insufficiency 10/27/2009  . Recurrent UTI 09/22/2008  . Vitamin D deficiency 09/10/2008  . COLONIC POLYPS, ADENOMATOUS  04/25/2007  . PERIPHERAL VASCULAR DISEASE 04/25/2007  . DIVERTICULOSIS, COLON 04/25/2007  . IBS 04/25/2007  . Osteoarthritis 04/25/2007  . Polymyalgia rheumatica ([place]) 04/25/2007    Past Surgical History:  Procedure Laterality Date  . APPENDECTOMY  1993  . BACK SURGERY    . bilateral pyelonephritis    . Biliroth I  1993  . bladder tack up    . CARDIAC CATHETERIZATION  2005   no significant CAD  . CHOLECYSTECTOMY    . COLECTOMY   1993   partial  . ENTEROSCOPY N/A 11/29/2015   Procedure: ENTEROSCOPY;  Surgeon: Ladene Artist, MD;  Location: [facility] ENDOSCOPY;  Service: Endoscopy;  Laterality: N/A;  . ESOPHAGOGASTRODUODENOSCOPY  05/2004   baretts esophagus, stomach polyps  . LEFT HEART CATHETERIZATION WITH CORONARY ANGIOGRAM N/A 06/12/2014   Procedure: LEFT HEART CATHETERIZATION WITH CORONARY ANGIOGRAM;  Surgeon: Peter M Martinique, MD;  Location: [facility] CATH LAB;  Service: Cardiovascular;  Laterality: N/A;  . TONSILLECTOMY    . TOTAL ABDOMINAL HYSTERECTOMY  1960s   fibroids   . VEIN LIGATION AND STRIPPING      OB History    No data available       Home Medications    Prior to Admission medications   Medication Sig Start Date End Date Taking? Authorizing Provider  acetaminophen (TYLENOL) 500 MG tablet Take 500 mg by mouth every 6 (six) hours as needed (pain).    Yes Historical Provider, MD  cholecalciferol (VITAMIN D) 1000 UNITS tablet Take 1,000 Units by mouth daily.     Yes Historical Provider, MD  CINNAMON PO Take 1,000 mg by mouth daily.   Yes Historical Provider, MD  diphenhydrAMINE (BENADRYL) 25 mg capsule Take 25 mg by mouth every 6 (six) hours as needed for itching.   Yes Historical Provider, MD  docusate sodium (COLACE) 100 MG capsule Take 100 mg by mouth daily as needed for mild constipation.   Yes Historical Provider, MD  furosemide (LASIX) 20 MG tablet Take 1 tablet (20 mg total) by mouth daily as needed for edema. 03/17/16  Yes Dorothy Spark, MD  hydrochlorothiazide (HYDRODIURIL) 25 MG tablet Take 1 tablet (25 mg total) by mouth daily. 01/27/16  Yes Abner Greenspan, MD  isosorbide mononitrate (IMDUR) 30 MG 24 hr tablet TAKE 0.5 TABLET (15 mg) BY MOUTH BID 06/20/16  Yes Dorothy Spark, MD  losartan (COZAAR) 50 MG tablet Take 1 tablet (50 mg total) by mouth daily. 01/27/16  Yes Abner Greenspan, MD  metoprolol tartrate (LOPRESSOR) 25 MG tablet Take 1 tablet (25 mg total) by mouth 2 (two) times daily. 06/20/16  Yes  Dorothy Spark, MD  Multiple Vitamins-Minerals (ICAPS PO) Take 1 capsule by mouth daily.   Yes Historical Provider, MD  Multiple Vitamins-Minerals (MULTIVITAMIN & MINERAL PO) Take 1 tablet by mouth daily.   Yes Historical Provider, MD  NITROSTAT 0.4 MG SL tablet DISSOLVE 1 TABLET UNDER THE TONGUE FOR CHEST PAIN. MAY REPEAT EVERY 5MINUTES UP TO 3 DOSES. IF NO RELIEF, CALL 911** 05/23/16  Yes Dorothy Spark, MD  Omega-3 Fatty Acids (FISH OIL) 1000 MG CAPS Take 1,000 mg by mouth daily.   Yes Historical Provider, MD  pantoprazole (PROTONIX) 40 MG tablet Take 1 tablet (40 mg total) by mouth 2 (two) times daily. 01/27/16  Yes Abner Greenspan, MD  Rivaroxaban (XARELTO) 15 MG TABS tablet Take 1 tablet (15 mg total) by mouth daily with supper. Starting 07/08/14 07/12/16  Yes Dorothy Spark, MD  traMADol (ULTRAM) 50 MG tablet Take 1 tablet (50 mg total) by mouth every 8 (eight) hours as needed. 08/03/16  Yes Abner Greenspan, MD  vitamin E 400 UNIT capsule Take 400 Units by mouth daily.     Yes Historical Provider, MD  VOLTAREN 1 % GEL Apply 2 g topically 2 (two) times daily as needed (knee pain). 03/15/16  Yes Abner Greenspan, MD    Family History Family History  Problem Relation Age of Onset  . CAD Mother   . Hypertension Mother   . Stroke Mother   . Heart attack Mother   . Pancreatic cancer Sister   . Diabetes Brother   . Heart Problems Brother   . Heart attack Brother     Social History Social History  Substance Use Topics  . Smoking status: Never Smoker  . Smokeless tobacco: Never Used  . Alcohol use No     Allergies   Metronidazole; Nitrofurantoin; Nsaids; Rofecoxib; Simvastatin; Erythromycin; Metformin; Sulfamethoxazole-trimethoprim; and Tetracycline   Review of Systems Review of Systems  Constitutional: Negative for fever.  Eyes: Negative for visual disturbance.  Respiratory: Negative for shortness of breath.   Cardiovascular: Negative for chest pain.  Gastrointestinal: Negative  for nausea.  Musculoskeletal: Positive for neck pain.  Neurological: Positive for headaches. Negative for weakness and numbness.  All other systems reviewed and are negative.    Physical Exam Updated Vital Signs BP 163/77 (BP Location: Left Arm)   Pulse 88   Temp 98 F (36.7 C) (Oral)   Resp 18   SpO2 100%   Physical Exam  Constitutional: She is oriented to person, place, and time. She appears well-developed and well-nourished.  elderly  HENT:  Head: Normocephalic and atraumatic.  Eyes: EOM are normal. Pupils are equal, round, and reactive to light.  Pupils 2 mm, reactive  Neck: Normal range of motion. Neck supple.  Tenderness to palpation left cervical paraspinous muscle region as well as left occipital region, no rash noted  Cardiovascular: Normal rate, regular rhythm and normal heart sounds.   No murmur heard. Pulmonary/Chest: Effort normal and breath sounds normal. No respiratory distress. She has no wheezes.  Abdominal: Soft. Bowel sounds are normal. There is no tenderness. There is no guarding.  Neurological: She is alert and oriented to person, place, and time. No cranial nerve deficit.  5 out of 5 strength in all 4 extremities  Skin: Skin is warm and dry.  Psychiatric: She has a normal mood and affect.  Nursing note and vitals reviewed.    ED Treatments / Results  DIAGNOSTIC STUDIES: Oxygen Saturation is 100% on RA, normal by my interpretation.  COORDINATION OF CARE:  11:11 PM Discussed treatment plan with pt at bedside and pt agreed to plan.  Labs (all labs ordered are listed, but only abnormal results are displayed) Labs Reviewed  CBC WITH DIFFERENTIAL/PLATELET - Abnormal; Notable for the following:       Result Value   RBC 3.76 (*)    Hemoglobin 11.8 (*)    HCT 35.2 (*)    Monocytes Absolute 1.1 (*)    All other components within normal limits  I-STAT CHEM 8, ED - Abnormal; Notable for the following:    Chloride 99 (*)    BUN 35 (*)    Creatinine,  Ser 1.20 (*)    All other components within normal limits    EKG  EKG Interpretation None       Radiology Ct Head Wo Contrast  Result Date:  08/11/2016 CLINICAL DATA:  Occipital headache. EXAM: CT HEAD WITHOUT CONTRAST TECHNIQUE: Contiguous axial images were obtained from the base of the skull through the vertex without intravenous contrast. COMPARISON:  None. FINDINGS: Brain: 48 x 45 x 38 mm extra-axial appearing mass in the left posterior fossa with broad contact on the inferior surface of the tentorium. There is peripheral and bands of central dense calcification in this otherwise isodense mass. There is marked mass effect on the left cerebellar hemisphere with partial fourth ventricular effacement and edematous changes in the inferior cerebellum. Cerebellar tonsillar descent with foramen magnum crowding. No hydrocephalus or hemorrhage. No acute infarct. Mild generalized cerebral volume loss and microvascular ischemic change in the cerebral white matter. Vascular: Atherosclerotic calcification. Skull: Prominent arachnoid granulation type changes to the left occipital bone near the mass. Sinuses/Orbits: Negative.  Bilateral cataract resection. These results were called by telephone at the time of interpretation on 08/11/2016 at 2:36 am to Dr. Thayer Jew , who verbally acknowledged these results. IMPRESSION: Nearly 5 cm partly calcified mass in the posterior fossa consistent with a meningioma. Extensive mass effect on the left cerebellum with mild edema, foramen magnum crowding, and incomplete fourth ventricular effacement. Electronically Signed   By: Monte Fantasia M.D.   On: 08/11/2016 02:36    Procedures Procedures (including critical care time)  Medications Ordered in ED Medications  diazepam (VALIUM) tablet 2 mg (2 mg Oral Given 08/10/16 2340)  iopamidol (ISOVUE-370) 76 % injection 100 mL (80 mLs Intravenous Contrast Given 08/11/16 0156)  dexamethasone (DECADRON) injection 10 mg (10  mg Intravenous Given 08/11/16 0234)     Initial Impression / Assessment and Plan / ED Course  I have reviewed the triage vital signs and the nursing notes.  Pertinent labs & imaging results that were available during my care of the patient were reviewed by me and considered in my medical decision making (see chart for details).  Clinical Course   Patient presents with headache and neck pain. Reports that she "thinks she has a crick in her neck." Has had been progressive since the weekend. She is nonfocal. She denies neurologic complaint. She is nonfocal.  Given her age and the atypical nature of the pain, CT a head and neck ordered. Patient was given Valium for muscle spasm. I received a call from radiology. Patient has a calcified mass in her posterior fossa. CTA was aborted. He recommends MRI. Based on read, he is calling this likely a meningioma with mass effect and effacement. Patient was given Decadron. I discussed the results with the patient and her daughters. Patient is unsure of how she would want to proceed but daughters strongly that knowing what the options are are important. Will discuss with neurosurgery and the hospitalist.  3:28 AM Discussed the patient with Dr. Cyndy Freeze.  He states that the patient likely does not need an MRI. There would be no intervention.  He requests to speak to the family over the phone. And follow-up conversation, he agrees with Decadron. Options to admit for pain control versus outpatient follow-up. Family would like observation admission to get the patient more comfortable. Discussed with hospitalist.  Final Clinical Impressions(s) / ED Diagnoses   Final diagnoses:  Bad headache  Brain mass    New Prescriptions New Prescriptions   No medications on file   I personally performed the services described in this documentation, which was scribed in my presence. The recorded information has been reviewed and is accurate.    Merryl Hacker,  MD 08/11/16 [id number]    Merryl Hacker, MD 08/11/16 0330    Merryl Hacker, MD 08/11/16 [phone]

## 2016-08-10 NOTE — ED Triage Notes (Signed)
Pt c/o constant headache and L neck stiffness since Sunday. Pt sts she woke up with the pain. Pt sts "I think I have a crick in my neck." Pt also c/o nausea. Denies light sensitivity. Pt denies blurry vision or double vision. Pt denies dizziness. Sts "I just hurt," Pt able to move neck to show RN where it hurts. Pt moves neck with pain.

## 2016-08-11 ENCOUNTER — Encounter (HOSPITAL_COMMUNITY): Payer: Self-pay

## 2016-08-11 ENCOUNTER — Emergency Department (HOSPITAL_COMMUNITY): Payer: Medicare Other

## 2016-08-11 ENCOUNTER — Telehealth: Payer: Self-pay

## 2016-08-11 DIAGNOSIS — Z86711 Personal history of pulmonary embolism: Secondary | ICD-10-CM | POA: Diagnosis not present

## 2016-08-11 DIAGNOSIS — Z66 Do not resuscitate: Secondary | ICD-10-CM | POA: Diagnosis present

## 2016-08-11 DIAGNOSIS — I447 Left bundle-branch block, unspecified: Secondary | ICD-10-CM | POA: Diagnosis present

## 2016-08-11 DIAGNOSIS — R519 Headache, unspecified: Secondary | ICD-10-CM

## 2016-08-11 DIAGNOSIS — Z888 Allergy status to other drugs, medicaments and biological substances status: Secondary | ICD-10-CM | POA: Diagnosis not present

## 2016-08-11 DIAGNOSIS — Z7901 Long term (current) use of anticoagulants: Secondary | ICD-10-CM | POA: Diagnosis not present

## 2016-08-11 DIAGNOSIS — M542 Cervicalgia: Secondary | ICD-10-CM | POA: Diagnosis present

## 2016-08-11 DIAGNOSIS — G939 Disorder of brain, unspecified: Secondary | ICD-10-CM | POA: Diagnosis present

## 2016-08-11 DIAGNOSIS — I1310 Hypertensive heart and chronic kidney disease without heart failure, with stage 1 through stage 4 chronic kidney disease, or unspecified chronic kidney disease: Secondary | ICD-10-CM | POA: Diagnosis present

## 2016-08-11 DIAGNOSIS — I10 Essential (primary) hypertension: Secondary | ICD-10-CM | POA: Diagnosis not present

## 2016-08-11 DIAGNOSIS — E559 Vitamin D deficiency, unspecified: Secondary | ICD-10-CM | POA: Diagnosis present

## 2016-08-11 DIAGNOSIS — I2510 Atherosclerotic heart disease of native coronary artery without angina pectoris: Secondary | ICD-10-CM | POA: Diagnosis present

## 2016-08-11 DIAGNOSIS — R51 Headache: Secondary | ICD-10-CM

## 2016-08-11 DIAGNOSIS — D320 Benign neoplasm of cerebral meninges: Secondary | ICD-10-CM | POA: Diagnosis not present

## 2016-08-11 DIAGNOSIS — Z882 Allergy status to sulfonamides status: Secondary | ICD-10-CM | POA: Diagnosis not present

## 2016-08-11 DIAGNOSIS — Z79899 Other long term (current) drug therapy: Secondary | ICD-10-CM | POA: Diagnosis not present

## 2016-08-11 DIAGNOSIS — M353 Polymyalgia rheumatica: Secondary | ICD-10-CM | POA: Diagnosis present

## 2016-08-11 DIAGNOSIS — R739 Hyperglycemia, unspecified: Secondary | ICD-10-CM | POA: Diagnosis not present

## 2016-08-11 DIAGNOSIS — Z86010 Personal history of colonic polyps: Secondary | ICD-10-CM | POA: Diagnosis not present

## 2016-08-11 DIAGNOSIS — K219 Gastro-esophageal reflux disease without esophagitis: Secondary | ICD-10-CM | POA: Diagnosis present

## 2016-08-11 DIAGNOSIS — G9389 Other specified disorders of brain: Secondary | ICD-10-CM

## 2016-08-11 DIAGNOSIS — Z8249 Family history of ischemic heart disease and other diseases of the circulatory system: Secondary | ICD-10-CM | POA: Diagnosis not present

## 2016-08-11 DIAGNOSIS — E785 Hyperlipidemia, unspecified: Secondary | ICD-10-CM | POA: Diagnosis present

## 2016-08-11 DIAGNOSIS — N183 Chronic kidney disease, stage 3 (moderate): Secondary | ICD-10-CM | POA: Diagnosis present

## 2016-08-11 LAB — CBC WITH DIFFERENTIAL/PLATELET
BASOS PCT: 0 %
Basophils Absolute: 0 10*3/uL (ref 0.0–0.1)
Eosinophils Absolute: 0.1 10*3/uL (ref 0.0–0.7)
Eosinophils Relative: 1 %
HEMATOCRIT: 35.2 % — AB (ref 36.0–46.0)
Hemoglobin: 11.8 g/dL — ABNORMAL LOW (ref 12.0–15.0)
LYMPHS PCT: 18 %
Lymphs Abs: 1.1 10*3/uL (ref 0.7–4.0)
MCH: 31.4 pg (ref 26.0–34.0)
MCHC: 33.5 g/dL (ref 30.0–36.0)
MCV: 93.6 fL (ref 78.0–100.0)
MONO ABS: 1.1 10*3/uL — AB (ref 0.1–1.0)
MONOS PCT: 17 %
NEUTROS ABS: 4 10*3/uL (ref 1.7–7.7)
Neutrophils Relative %: 64 %
Platelets: 227 10*3/uL (ref 150–400)
RBC: 3.76 MIL/uL — ABNORMAL LOW (ref 3.87–5.11)
RDW: 13.9 % (ref 11.5–15.5)
WBC: 6.3 10*3/uL (ref 4.0–10.5)

## 2016-08-11 LAB — I-STAT CHEM 8, ED
BUN: 35 mg/dL — AB (ref 6–20)
CHLORIDE: 99 mmol/L — AB (ref 101–111)
CREATININE: 1.2 mg/dL — AB (ref 0.44–1.00)
Calcium, Ion: 1.19 mmol/L (ref 1.12–1.23)
Glucose, Bld: 98 mg/dL (ref 65–99)
HCT: 38 % (ref 36.0–46.0)
Hemoglobin: 12.9 g/dL (ref 12.0–15.0)
Potassium: 3.8 mmol/L (ref 3.5–5.1)
SODIUM: 139 mmol/L (ref 135–145)
TCO2: 24 mmol/L (ref 0–100)

## 2016-08-11 LAB — GLUCOSE, CAPILLARY
GLUCOSE-CAPILLARY: 183 mg/dL — AB (ref 65–99)
GLUCOSE-CAPILLARY: 163 mg/dL — AB (ref 65–99)
Glucose-Capillary: 163 mg/dL — ABNORMAL HIGH (ref 65–99)
Glucose-Capillary: 200 mg/dL — ABNORMAL HIGH (ref 65–99)

## 2016-08-11 MED ORDER — METOPROLOL TARTRATE 25 MG PO TABS
25.0000 mg | ORAL_TABLET | Freq: Two times a day (BID) | ORAL | Status: DC
  Administered 2016-08-11 – 2016-08-13 (×5): 25 mg via ORAL
  Filled 2016-08-11 (×6): qty 1

## 2016-08-11 MED ORDER — DIAZEPAM 2 MG PO TABS
2.0000 mg | ORAL_TABLET | Freq: Four times a day (QID) | ORAL | Status: DC | PRN
  Administered 2016-08-11 (×2): 2 mg via ORAL
  Filled 2016-08-11 (×2): qty 1

## 2016-08-11 MED ORDER — DOCUSATE SODIUM 100 MG PO CAPS: 100.0000 mg | ORAL_CAPSULE | Freq: Every day | ORAL | Status: DC | PRN

## 2016-08-11 MED ORDER — VITAMIN D3 25 MCG (1000 UNIT) PO TABS
1000.0000 [IU] | ORAL_TABLET | Freq: Every day | ORAL | Status: DC
  Administered 2016-08-11 – 2016-08-13 (×3): 1000 [IU] via ORAL
  Filled 2016-08-11 (×3): qty 1

## 2016-08-11 MED ORDER — INSULIN ASPART 100 UNIT/ML ~~LOC~~ SOLN: 0.0000 [IU] | Freq: Every day | SUBCUTANEOUS | Status: DC

## 2016-08-11 MED ORDER — DICLOFENAC SODIUM 1 % TD GEL: 2.0000 g | Freq: Two times a day (BID) | TRANSDERMAL | Status: DC | PRN

## 2016-08-11 MED ORDER — IOPAMIDOL (ISOVUE-370) INJECTION 76%
100.0000 mL | Freq: Once | INTRAVENOUS | Status: AC | PRN
  Administered 2016-08-11: 80 mL via INTRAVENOUS

## 2016-08-11 MED ORDER — FUROSEMIDE 40 MG PO TABS: 20.0000 mg | ORAL_TABLET | Freq: Every day | ORAL | Status: DC | PRN

## 2016-08-11 MED ORDER — ISOSORBIDE MONONITRATE 15 MG HALF TABLET
15.0000 mg | ORAL_TABLET | Freq: Two times a day (BID) | ORAL | Status: DC
  Administered 2016-08-11 – 2016-08-13 (×5): 15 mg via ORAL
  Filled 2016-08-11 (×7): qty 1

## 2016-08-11 MED ORDER — INSULIN ASPART 100 UNIT/ML ~~LOC~~ SOLN
0.0000 [IU] | Freq: Three times a day (TID) | SUBCUTANEOUS | Status: DC
  Administered 2016-08-11 – 2016-08-12 (×2): 3 [IU] via SUBCUTANEOUS
  Administered 2016-08-12: 2 [IU] via SUBCUTANEOUS
  Administered 2016-08-12: 3 [IU] via SUBCUTANEOUS

## 2016-08-11 MED ORDER — DEXAMETHASONE SODIUM PHOSPHATE 4 MG/ML IJ SOLN
4.0000 mg | Freq: Four times a day (QID) | INTRAMUSCULAR | Status: DC
  Administered 2016-08-11 – 2016-08-13 (×8): 4 mg via INTRAVENOUS
  Filled 2016-08-11 (×8): qty 1

## 2016-08-11 MED ORDER — DIAZEPAM 2 MG PO TABS
2.0000 mg | ORAL_TABLET | Freq: Once | ORAL | Status: AC
  Administered 2016-08-11: 2 mg via ORAL
  Filled 2016-08-11: qty 1

## 2016-08-11 MED ORDER — PANTOPRAZOLE SODIUM 40 MG PO TBEC
40.0000 mg | DELAYED_RELEASE_TABLET | Freq: Two times a day (BID) | ORAL | Status: DC
  Administered 2016-08-11 – 2016-08-13 (×5): 40 mg via ORAL
  Filled 2016-08-11 (×5): qty 1

## 2016-08-11 MED ORDER — OXYCODONE-ACETAMINOPHEN 5-325 MG PO TABS: 1.0000 | ORAL_TABLET | ORAL | Status: DC | PRN

## 2016-08-11 MED ORDER — ACETAMINOPHEN 500 MG PO TABS: 500.0000 mg | ORAL_TABLET | Freq: Four times a day (QID) | ORAL | Status: DC | PRN

## 2016-08-11 MED ORDER — OXYCODONE HCL ER 10 MG PO T12A
10.0000 mg | EXTENDED_RELEASE_TABLET | Freq: Two times a day (BID) | ORAL | Status: DC
  Administered 2016-08-11 – 2016-08-13 (×5): 10 mg via ORAL
  Filled 2016-08-11 (×5): qty 1

## 2016-08-11 MED ORDER — HYDROCHLOROTHIAZIDE 25 MG PO TABS
25.0000 mg | ORAL_TABLET | Freq: Every day | ORAL | Status: DC
  Administered 2016-08-11 – 2016-08-13 (×3): 25 mg via ORAL
  Filled 2016-08-11 (×3): qty 1

## 2016-08-11 MED ORDER — LOSARTAN POTASSIUM 50 MG PO TABS
50.0000 mg | ORAL_TABLET | Freq: Every day | ORAL | Status: DC
  Administered 2016-08-11 – 2016-08-13 (×3): 50 mg via ORAL
  Filled 2016-08-11 (×3): qty 1

## 2016-08-11 MED ORDER — RIVAROXABAN 15 MG PO TABS
15.0000 mg | ORAL_TABLET | Freq: Every day | ORAL | Status: DC
  Administered 2016-08-11 – 2016-08-12 (×2): 15 mg via ORAL
  Filled 2016-08-11 (×3): qty 1

## 2016-08-11 MED ORDER — VITAMIN E 180 MG (400 UNIT) PO CAPS
400.0000 [IU] | ORAL_CAPSULE | Freq: Every day | ORAL | Status: DC
  Administered 2016-08-11 – 2016-08-13 (×3): 400 [IU] via ORAL
  Filled 2016-08-11 (×3): qty 1

## 2016-08-11 MED ORDER — POLYETHYLENE GLYCOL 3350 17 G PO PACK
17.0000 g | PACK | Freq: Every day | ORAL | Status: DC
  Administered 2016-08-11 – 2016-08-13 (×3): 17 g via ORAL
  Filled 2016-08-11 (×3): qty 1

## 2016-08-11 MED ORDER — DIPHENHYDRAMINE HCL 25 MG PO CAPS: 25.0000 mg | ORAL_CAPSULE | Freq: Four times a day (QID) | ORAL | Status: DC | PRN

## 2016-08-11 MED ORDER — TRAMADOL HCL 50 MG PO TABS
50.0000 mg | ORAL_TABLET | Freq: Three times a day (TID) | ORAL | Status: DC | PRN
  Administered 2016-08-11 (×2): 50 mg via ORAL
  Filled 2016-08-11 (×2): qty 1

## 2016-08-11 MED ORDER — DEXAMETHASONE SODIUM PHOSPHATE 10 MG/ML IJ SOLN
10.0000 mg | Freq: Once | INTRAMUSCULAR | Status: AC
  Administered 2016-08-11: 10 mg via INTRAVENOUS
  Filled 2016-08-11: qty 1

## 2016-08-11 NOTE — H&P (Signed)
History and Physical    Ellen Peterson [id number] DOB: 28-Jun-1920 DOA: 08/10/2016   PCP: [name], MD Chief Complaint:  Chief Complaint  Patient presents with  . Neck Pain  . Migraine    HPI: Ellen Peterson is a 82 y.o. female with medical history significant of HTN, LBBB (not due to CAD), PE on chronic xarelto, CKD stage 3.  Patient presents to the ED with c/o headache.  Headache onset 4 days ago, feels like a "knot" in her neck.  Located in the left side of her neck and posterior occiput.  Pain worse with movement.  No weakness, numbness, visual changes, stroke like symptoms.  ED Course: CT head reveals a mass in the cerebellum that is partially calcified, mild surrounding edema.  Probably a meningioma.  Review of Systems: As per HPI otherwise 10 point review of systems negative.    Past Medical History:  Diagnosis Date  . Chest tightness    catheterization, 2005, normal coronaries  . Complication of anesthesia   . Diverticulosis of colon   . Ejection fraction < 50%    EF 40% echo, 09/2009  . Enteritis 11/2015  . Frequent UTI   . GERD (gastroesophageal reflux disease)   . Hyperlipidemia   . Hypertension   . Hypokalemia   . LBBB (left bundle branch block)    Since at least 2005  . OA (osteoarthritis)   . PE (pulmonary embolism) 06/16/2014  . Peripheral vascular disease ([place])   . PMR (polymyalgia rheumatica) (HCC)   . PONV (postoperative nausea and vomiting)   . Recurrent UTI   . Shortness of breath   . Wide-complex tachycardia (HCC)    SVT in past    Past Surgical History:  Procedure Laterality Date  . APPENDECTOMY  1993  . BACK SURGERY    . bilateral pyelonephritis    . Biliroth I  1993  . bladder tack up    . CARDIAC CATHETERIZATION  2005   no significant CAD  . CHOLECYSTECTOMY    . COLECTOMY  1993   partial  . ENTEROSCOPY N/A 11/29/2015   Procedure: ENTEROSCOPY;  Surgeon: Ladene Artist, MD;  Location: [facility] ENDOSCOPY;  Service: Endoscopy;   Laterality: N/A;  . ESOPHAGOGASTRODUODENOSCOPY  05/2004   baretts esophagus, stomach polyps  . LEFT HEART CATHETERIZATION WITH CORONARY ANGIOGRAM N/A 06/12/2014   Procedure: LEFT HEART CATHETERIZATION WITH CORONARY ANGIOGRAM;  Surgeon: Peter M Martinique, MD;  Location: [facility] CATH LAB;  Service: Cardiovascular;  Laterality: N/A;  . TONSILLECTOMY    . TOTAL ABDOMINAL HYSTERECTOMY  1960s   fibroids   . VEIN LIGATION AND STRIPPING       reports that she has never smoked. She has never used smokeless tobacco. She reports that she does not drink alcohol or use drugs.  Allergies  Allergen Reactions  . Metronidazole Hives  . Nitrofurantoin Hives  . Nsaids Other (See Comments)    REACTION: GI symptoms  . Rofecoxib Other (See Comments)    REACTION: GI symptoms  . Simvastatin Other (See Comments)    REACTION: myalgia  . Erythromycin Rash  . Metformin Diarrhea and Nausea And Vomiting  . Sulfamethoxazole-Trimethoprim Itching  . Tetracycline Rash    Family History  Problem Relation Age of Onset  . CAD Mother   . Hypertension Mother   . Stroke Mother   . Heart attack Mother   . Pancreatic cancer Sister   . Diabetes Brother   . Heart Problems Brother   . Heart  attack Brother       Prior to Admission medications   Medication Sig Start Date End Date Taking? Authorizing Provider  acetaminophen (TYLENOL) 500 MG tablet Take 500 mg by mouth every 6 (six) hours as needed (pain).    Yes Historical Provider, MD  cholecalciferol (VITAMIN D) 1000 UNITS tablet Take 1,000 Units by mouth daily.     Yes Historical Provider, MD  CINNAMON PO Take 1,000 mg by mouth daily.   Yes Historical Provider, MD  diphenhydrAMINE (BENADRYL) 25 mg capsule Take 25 mg by mouth every 6 (six) hours as needed for itching.   Yes Historical Provider, MD  docusate sodium (COLACE) 100 MG capsule Take 100 mg by mouth daily as needed for mild constipation.   Yes Historical Provider, MD  furosemide (LASIX) 20 MG tablet Take 1 tablet  (20 mg total) by mouth daily as needed for edema. 03/17/16  Yes Dorothy Spark, MD  hydrochlorothiazide (HYDRODIURIL) 25 MG tablet Take 1 tablet (25 mg total) by mouth daily. 01/27/16  Yes Abner Greenspan, MD  isosorbide mononitrate (IMDUR) 30 MG 24 hr tablet TAKE 0.5 TABLET (15 mg) BY MOUTH BID 06/20/16  Yes Dorothy Spark, MD  losartan (COZAAR) 50 MG tablet Take 1 tablet (50 mg total) by mouth daily. 01/27/16  Yes Abner Greenspan, MD  metoprolol tartrate (LOPRESSOR) 25 MG tablet Take 1 tablet (25 mg total) by mouth 2 (two) times daily. 06/20/16  Yes Dorothy Spark, MD  Multiple Vitamins-Minerals (ICAPS PO) Take 1 capsule by mouth daily.   Yes Historical Provider, MD  Multiple Vitamins-Minerals (MULTIVITAMIN & MINERAL PO) Take 1 tablet by mouth daily.   Yes Historical Provider, MD  NITROSTAT 0.4 MG SL tablet DISSOLVE 1 TABLET UNDER THE TONGUE FOR CHEST PAIN. MAY REPEAT EVERY 5MINUTES UP TO 3 DOSES. IF NO RELIEF, CALL 911** 05/23/16  Yes Dorothy Spark, MD  Omega-3 Fatty Acids (FISH OIL) 1000 MG CAPS Take 1,000 mg by mouth daily.   Yes Historical Provider, MD  pantoprazole (PROTONIX) 40 MG tablet Take 1 tablet (40 mg total) by mouth 2 (two) times daily. 01/27/16  Yes Abner Greenspan, MD  Rivaroxaban (XARELTO) 15 MG TABS tablet Take 1 tablet (15 mg total) by mouth daily with supper. Starting 07/08/14 07/12/16  Yes Dorothy Spark, MD  traMADol (ULTRAM) 50 MG tablet Take 1 tablet (50 mg total) by mouth every 8 (eight) hours as needed. 08/03/16  Yes Abner Greenspan, MD  vitamin E 400 UNIT capsule Take 400 Units by mouth daily.     Yes Historical Provider, MD  VOLTAREN 1 % GEL Apply 2 g topically 2 (two) times daily as needed (knee pain). 03/15/16  Yes Abner Greenspan, MD    Physical Exam: Vitals:   08/10/16 1902 08/10/16 2106 08/10/16 2355 08/11/16 0237  BP: 132/56 159/60 177/70 163/77  Pulse: 92 85 83 88  Resp: 16 18 17 18   Temp: 98 F (36.7 C)     TempSrc: Oral     SpO2: 98% 100% 97% 100%       Constitutional: NAD, calm, comfortable Eyes: PERRL, lids and conjunctivae normal ENMT: Mucous membranes are moist. Posterior pharynx clear of any exudate or lesions.Normal dentition.  Neck: normal, supple, no masses, no thyromegaly Respiratory: clear to auscultation bilaterally, no wheezing, no crackles. Normal respiratory effort. No accessory muscle use.  Cardiovascular: Regular rate and rhythm, no murmurs / rubs / gallops. No extremity edema. 2+ pedal pulses. No carotid bruits.  Abdomen: no tenderness, no masses palpated. No hepatosplenomegaly. Bowel sounds positive.  Musculoskeletal: no clubbing / cyanosis. No joint deformity upper and lower extremities. Good ROM, no contractures. Normal muscle tone.  Skin: no rashes, lesions, ulcers. No induration Neurologic: CN 2-12 grossly intact. Sensation intact, DTR normal. Strength 5/5 in all 4.  Psychiatric: Normal judgment and insight. Alert and oriented x 3. Normal mood.    Labs on Admission: I have personally reviewed following labs and imaging studies  CBC:  Recent Labs Lab 08/10/16 2352 08/11/16 0001  WBC 6.3  --   NEUTROABS 4.0  --   HGB 11.8* 12.9  HCT 35.2* 38.0  MCV 93.6  --   PLT 227  --    Basic Metabolic Panel:  Recent Labs Lab 08/11/16 0001  NA 139  K 3.8  CL 99*  GLUCOSE 98  BUN 35*  CREATININE 1.20*   GFR: CrCl cannot be calculated (Unknown ideal weight.). Liver Function Tests: No results for input(s): AST, ALT, ALKPHOS, BILITOT, PROT, ALBUMIN in the last 168 hours. No results for input(s): LIPASE, AMYLASE in the last 168 hours. No results for input(s): AMMONIA in the last 168 hours. Coagulation Profile: No results for input(s): INR, PROTIME in the last 168 hours. Cardiac Enzymes: No results for input(s): CKTOTAL, CKMB, CKMBINDEX, TROPONINI in the last 168 hours. BNP (last 3 results) No results for input(s): PROBNP in the last 8760 hours. HbA1C: No results for input(s): HGBA1C in the last 72  hours. CBG: No results for input(s): GLUCAP in the last 168 hours. Lipid Profile: No results for input(s): CHOL, HDL, LDLCALC, TRIG, CHOLHDL, LDLDIRECT in the last 72 hours. Thyroid Function Tests: No results for input(s): TSH, T4TOTAL, FREET4, T3FREE, THYROIDAB in the last 72 hours. Anemia Panel: No results for input(s): VITAMINB12, FOLATE, FERRITIN, TIBC, IRON, RETICCTPCT in the last 72 hours. Urine analysis:    Component Value Date/Time   COLORURINE YELLOW 12/25/2015 1557   APPEARANCEUR CLEAR 12/25/2015 1557   APPEARANCEUR HAZY 07/02/2014 0011   LABSPEC 1.019 12/25/2015 1557   LABSPEC 1.010 07/02/2014 0011   PHURINE 6.0 12/25/2015 1557   GLUCOSEU NEGATIVE 12/25/2015 1557   GLUCOSEU NEGATIVE 07/02/2014 0011   HGBUR NEGATIVE 12/25/2015 1557   HGBUR moderate 09/20/2010 1406   BILIRUBINUR NEGATIVE 12/25/2015 1557   BILIRUBINUR Neg. 11/11/2015 1424   BILIRUBINUR NEGATIVE 07/02/2014 0011   KETONESUR NEGATIVE 12/25/2015 1557   PROTEINUR NEGATIVE 12/25/2015 1557   UROBILINOGEN 0.2 11/11/2015 1424   UROBILINOGEN 0.2 07/21/2011 0049   NITRITE POSITIVE (A) 12/25/2015 1557   LEUKOCYTESUR SMALL (A) 12/25/2015 1557   LEUKOCYTESUR 3+ 07/02/2014 0011   Sepsis Labs: @LABRCNTIP (procalcitonin:4,lacticidven:4) )No results found for this or any previous visit (from the past 240 hour(s)).   Radiological Exams on Admission: Ct Head Wo Contrast  Result Date: 08/11/2016 CLINICAL DATA:  Occipital headache. EXAM: CT HEAD WITHOUT CONTRAST TECHNIQUE: Contiguous axial images were obtained from the base of the skull through the vertex without intravenous contrast. COMPARISON:  None. FINDINGS: Brain: 48 x 45 x 38 mm extra-axial appearing mass in the left posterior fossa with broad contact on the inferior surface of the tentorium. There is peripheral and bands of central dense calcification in this otherwise isodense mass. There is marked mass effect on the left cerebellar hemisphere with partial fourth  ventricular effacement and edematous changes in the inferior cerebellum. Cerebellar tonsillar descent with foramen magnum crowding. No hydrocephalus or hemorrhage. No acute infarct. Mild generalized cerebral volume loss and microvascular ischemic change in the cerebral  white matter. Vascular: Atherosclerotic calcification. Skull: Prominent arachnoid granulation type changes to the left occipital bone near the mass. Sinuses/Orbits: Negative.  Bilateral cataract resection. These results were called by telephone at the time of interpretation on 08/11/2016 at 2:36 am to Dr. Thayer Jew , who verbally acknowledged these results. IMPRESSION: Nearly 5 cm partly calcified mass in the posterior fossa consistent with a meningioma. Extensive mass effect on the left cerebellum with mild edema, foramen magnum crowding, and incomplete fourth ventricular effacement. Electronically Signed   By: Monte Fantasia M.D.   On: 08/11/2016 02:36    EKG: Independently reviewed.  Assessment/Plan Principal Problem:   Meningioma of cerebellum ([place])    1. Meningioma of cerebellum - 1. EDP spoke with Dr. Christella Noa 1. Admit for headache control 2. Try decadron, if it dosent work then headache probably not due to the mass 3. Valium also for headache control (since this is already working in ED) 4. Dr. Christella Noa said he did not want [place] to order MRI brain 5. It does not at all sound like this will be surgical in nature. 6. Dr. Christella Noa said to call him in AM if patient was admitted and that he would see patient.   DVT prophylaxis: Xarelto (chronic) Code Status: DNR Family Communication: Family at bedside Consults called: EDP spoke with Dr. Christella Noa Admission status: Place in [place], [place] [facility] Pager [phone] from 7PM-7AM  If 7AM-7PM, please contact the day physician for the patient [email or web address] Password [facility]  08/11/2016, 3:40 AM

## 2016-08-11 NOTE — Telephone Encounter (Signed)
Per chart review pt was admitted to [facility] on 08/10/16.

## 2016-08-11 NOTE — Telephone Encounter (Signed)
PLEASE NOTE: All timestamps contained within this report are represented as [place] Standard Time. CONFIDENTIALTY NOTICE: This fax transmission is intended only for the addressee. It contains information that is legally privileged, confidential or otherwise protected from use or disclosure. If you are not the intended recipient, you are strictly prohibited from reviewing, disclosing, copying using or disseminating any of this information or taking any action in reliance on or regarding this information. If you have received this fax in error, please notify us immediately by telephone so that we can arrange for its return to [place]. Phone: [phone], Toll-Free: [phone], Fax: [phone] Page: 1 of 1 Call Id: [id number] [name] Patient Name: Ellen Peterson Gender: Female DOB: 08/03/1920 Age: 82 Y 42 M 28 D Return Phone Number: [phone] (Primary), [phone] (Secondary) Address: City/State/Zip: [place] Client [place] Night - Client Client Site [place] Physician [place], Roque Lias - MD Contact Type Call Who Is Calling Patient / Member / Family / Caregiver Call Type Triage / Clinical Caller Name Vaughan Basta Relationship To Patient Daughter Return Phone Number [phone] (Primary) Chief Complaint Headache Reason for Call Symptomatic / Request for [place] states mother has a crick in her neck and a headache. Translation No Nurse Assessment Guidelines Guideline Title Affirmed Question Affirmed Notes Nurse Date/Time (Eastern Time) Disp. Time Eilene Ghazi Time) Disposition Final User 08/10/2016 5:06:56 PM Attempt made - message left Conner, RN, Lesa 08/10/2016 5:09:47 PM Attempt made - message left Conner, RN, Lesa 08/10/2016 5:33:27 PM Attempt made - message left Conner, RN, Emmaline Kluver 08/10/2016 5:49:13 PM FINAL ATTEMPT  MADE - message left Yes Conner, RN, Emmaline Kluver

## 2016-08-11 NOTE — Progress Notes (Addendum)
Nursing Note: Pt arrived via stretcher and was assisted onto bed.Pt awake,alert and oriented.HOH,must speak loud enough that pt can hear you.Pt was wet and wearing a pull up from home and was cleaned and changed.Daughters accompanied pt.Pt c/o L neck and head pain which was reason for admission.Pt oriented to room and call bell.Bed low,losked and clal bell in reach.Pt cued and knows how to use the call bell to call for assistance.Will check orders and medicate for pain.wbb

## 2016-08-11 NOTE — Progress Notes (Signed)
Progress Note    Ellen Peterson  [id number] DOB: 1920-05-23  DOA: 08/10/2016 PCP: [name], MD    Brief Narrative:   Ellen Peterson is an 82 y.o. female with a PMH of hypertension, left bundle branch block, pulmonary embolism on chronic Xarelto and stage III chronic kidney disease who was admitted 08/10/16 with a chief complaint of a 4 day history of headache. CT of the head revealed a mass in the cerebellum, partially calcified, with mild surrounding edema suspected to be a meningioma.  Assessment/Plan:   Principal Problem:   Meningioma of cerebellum ([place]) with headache Admitted for headache control. Continue Decadron and Valium. Dr. Cyndy Freeze consulted, did not feel that the patient was an operative candidate and spoke with the patient and her family 08/10/16. Family does not wish to pursue an aggressive evaluation at this time. They do want to focus on pain control, so I have adjusted her pain medications. I have started OxyContin 10 mg every 12 hours with Percocet 1-2 tabs Q4 hours as needed for breakthrough pain. Bowel regimen initiated. Will obtain a PT evaluation for disposition planning.    Active problems: Hypertension/CAD Continue HCTZ, metoprolol, Imdur.  Hyperglycemia History of diet-controlled diabetes in the past, now exacerbated by steroids. We'll start moderate scale SSI.  History of pulmonary embolism Continue xarelto.   Family Communication/Anticipated D/C date and plan/Code Status   DVT prophylaxis: Xarelto ordered. Code Status: DO NOT RESUSCITATE Family Communication: Multiple daughters updated at the bedside. Disposition Plan: Home when pain adequately controlled, likely another 24-48 hours.   Medical Consultants:    Neurosurgery: Dr. Arneta Cliche.   Procedures:    None  Anti-Infectives:   None.  Subjective:    Ellen Peterson continues to have significant left-sided headache radiating to her neck. Denies toothaches or sinus pain/pressure.  Appetite fair. Some nausea but no vomiting.  Objective:    Vitals:   08/11/16 0359 08/11/16 0457 08/11/16 0524 08/11/16 0824  BP: 104/88 144/73 (!) 175/87 (!) 153/74  Pulse: 100 98 (!) 107 72  Resp: 18 16 18    Temp:   98 F (36.7 C)   TempSrc:   Oral   SpO2: 96% 96% 100% 98%   No intake or output data in the 24 hours ending 08/11/16 0933 There were no vitals filed for this visit.  Exam: General exam: Appears calm and comfortable.  Respiratory system: Clear to auscultation. Respiratory effort normal. Cardiovascular system: S1 & S2 heard, RRR. No JVD,  rubs, gallops or clicks. No murmurs. Gastrointestinal system: Abdomen is nondistended, soft and nontender. No organomegaly or masses felt. Normal bowel sounds heard. Central nervous system: Alert and oriented to self and place. No focal neurological deficits. Extremities: No clubbing,  or cyanosis. No edema. Skin: No rashes, lesions or ulcers. Psychiatry: Judgement and insight appear normal. Mood & affect appropriate.   Data Reviewed:   I have personally reviewed following labs and imaging studies:  Labs: Basic Metabolic Panel:  Recent Labs Lab 08/11/16 0001  NA 139  K 3.8  CL 99*  GLUCOSE 98  BUN 35*  CREATININE 1.20*   GFR CrCl cannot be calculated (Unknown ideal weight.). Liver Function Tests: No results for input(s): AST, ALT, ALKPHOS, BILITOT, PROT, ALBUMIN in the last 168 hours. No results for input(s): LIPASE, AMYLASE in the last 168 hours. No results for input(s): AMMONIA in the last 168 hours. Coagulation profile No results for input(s): INR, PROTIME in the last 168 hours.  CBC:  Recent  Labs Lab 08/10/16 2352 08/11/16 0001  WBC 6.3  --   NEUTROABS 4.0  --   HGB 11.8* 12.9  HCT 35.2* 38.0  MCV 93.6  --   PLT 227  --    CBG:  Recent Labs Lab 08/11/16 0800  GLUCAP 163*    Microbiology No results found for this or any previous visit (from the past 240 hour(s)).  Radiology: Ct Head Wo  Contrast  Result Date: 08/11/2016 CLINICAL DATA:  Occipital headache. EXAM: CT HEAD WITHOUT CONTRAST TECHNIQUE: Contiguous axial images were obtained from the base of the skull through the vertex without intravenous contrast. COMPARISON:  None. FINDINGS: Brain: 48 x 45 x 38 mm extra-axial appearing mass in the left posterior fossa with broad contact on the inferior surface of the tentorium. There is peripheral and bands of central dense calcification in this otherwise isodense mass. There is marked mass effect on the left cerebellar hemisphere with partial fourth ventricular effacement and edematous changes in the inferior cerebellum. Cerebellar tonsillar descent with foramen magnum crowding. No hydrocephalus or hemorrhage. No acute infarct. Mild generalized cerebral volume loss and microvascular ischemic change in the cerebral white matter. Vascular: Atherosclerotic calcification. Skull: Prominent arachnoid granulation type changes to the left occipital bone near the mass. Sinuses/Orbits: Negative.  Bilateral cataract resection. These results were called by telephone at the time of interpretation on 08/11/2016 at 2:36 am to Dr. Thayer Jew , who verbally acknowledged these results. IMPRESSION: Nearly 5 cm partly calcified mass in the posterior fossa consistent with a meningioma. Extensive mass effect on the left cerebellum with mild edema, foramen magnum crowding, and incomplete fourth ventricular effacement. Electronically Signed   By: Monte Fantasia M.D.   On: 08/11/2016 02:36    Medications:   . cholecalciferol  1,000 Units Oral Daily  . dexamethasone  4 mg Intravenous Q6H  . hydrochlorothiazide  25 mg Oral Daily  . isosorbide mononitrate  15 mg Oral BID  . losartan  50 mg Oral Daily  . metoprolol tartrate  25 mg Oral BID  . pantoprazole  40 mg Oral BID  . Rivaroxaban  15 mg Oral Q supper  . vitamin E  400 Units Oral Daily   Continuous Infusions:   Time spent: 35 minutes with > 50% of time  discussing current diagnostic test results, clinical impression and plan of care.   LOS: 0 days   [name],[name]  [facility] Pager [phone]. If unable to reach me by pager, please call my cell phone at [phone].  *Please refer to [email or web address], password [email or web address] to get updated schedule on who will round on this patient, as hospitalists switch teams weekly. If 7PM-7AM, please contact night-coverage at [email or web address], password [email or web address] for any overnight needs.  08/11/2016, 9:33 AM

## 2016-08-12 DIAGNOSIS — Z86711 Personal history of pulmonary embolism: Secondary | ICD-10-CM

## 2016-08-12 DIAGNOSIS — R739 Hyperglycemia, unspecified: Secondary | ICD-10-CM

## 2016-08-12 DIAGNOSIS — I10 Essential (primary) hypertension: Secondary | ICD-10-CM

## 2016-08-12 DIAGNOSIS — D320 Benign neoplasm of cerebral meninges: Principal | ICD-10-CM

## 2016-08-12 LAB — GLUCOSE, CAPILLARY
GLUCOSE-CAPILLARY: 128 mg/dL — AB (ref 65–99)
GLUCOSE-CAPILLARY: 168 mg/dL — AB (ref 65–99)
GLUCOSE-CAPILLARY: 156 mg/dL — AB (ref 65–99)
Glucose-Capillary: 175 mg/dL — ABNORMAL HIGH (ref 65–99)

## 2016-08-12 NOTE — Progress Notes (Signed)
PROGRESS NOTE    Ellen Peterson  [id number] DOB: 12/02/1920 DOA: 08/10/2016 PCP: [name], MD   Brief Narrative: Ellen Peterson is an 82 y.o. female with a PMH of hypertension, left bundle branch block, pulmonary embolism on chronic Xarelto and stage III chronic kidney disease who was admitted 08/10/16 with a chief complaint of a 4 day history of headache. CT of the head revealed a mass in the cerebellum, partially calcified, with mild surrounding edema suspected to be a meningioma.   Assessment & Plan:   Principal Problem:   Meningioma of cerebellum (HCC) Active Problems:   Hyperglycemia   History of pulmonary embolism   Essential hypertension, benign   CAD (coronary artery disease), native coronary artery   Bad headache   Brain mass   Neck pain   Meningioma of cerebellum Headache Headache improved. Not a surgical candidate which was discussed with family on 08/10/16. Current consensus is to not pursue aggressive evaluation -continue oxycontin 10mg  q12 hours -continue Percocet 1-2 tabs q4 hours prn  Hypertension -continue hydrochlorothiazide, metoprolol and imdur  Hyperglycemia -moderate SSI  History of PE -Continue Xarelto   DVT prophylaxis: Xarelto Family Communication: None at bedside. Discussed with daughter later on Disposition Plan: Likely home tomorrow   Consultants:   Neurosurgery, Dr. Arneta Cliche  Procedures:  None  Antimicrobials:  None    Subjective: Patient reports a mild posterior headache that is improved today. No other complaints.  Objective: Vitals:   08/11/16 2228 08/12/16 0518 08/12/16 0941 08/12/16 1416  BP: (!) 102/48 138/60 133/61 (!) 108/59  Pulse: 76 68 81 (!) 58  Resp:  16    Temp:  97.7 F (36.5 C)  98.9 F (37.2 C)  TempSrc:  Oral  Oral  SpO2:  98%  99%    Intake/Output Summary (Last 24 hours) at 08/12/16 1940 Last data filed at 08/12/16 1235  Gross per 24 hour  Intake              755 ml  Output                0  ml  Net              755 ml   There were no vitals filed for this visit.  Examination:  General exam: Appears calm and comfortable Respiratory system: Clear to auscultation. Respiratory effort normal. Cardiovascular system: S1 & S2 heard, RRR. No murmurs, rubs, gallops or clicks. Gastrointestinal system: Abdomen is nondistended, soft and nontender. No organomegaly or masses felt. Normal bowel sounds heard. Central nervous system: Alert and oriented. No focal neurological deficits. Extremities: No clubbing,  or cyanosis. No edema. Skin: No rashes, lesions or ulcers Psychiatry: Judgement and insight appear normal. Mood & affect appropriate.     Data Reviewed: I have personally reviewed following labs and imaging studies  CBC:  Recent Labs Lab 08/10/16 2352 08/11/16 0001  WBC 6.3  --   NEUTROABS 4.0  --   HGB 11.8* 12.9  HCT 35.2* 38.0  MCV 93.6  --   PLT 227  --    Basic Metabolic Panel:  Recent Labs Lab 08/11/16 0001  NA 139  K 3.8  CL 99*  GLUCOSE 98  BUN 35*  CREATININE 1.20*   GFR: CrCl cannot be calculated (Unknown ideal weight.). Liver Function Tests: No results for input(s): AST, ALT, ALKPHOS, BILITOT, PROT, ALBUMIN in the last 168 hours. No results for input(s): LIPASE, AMYLASE in the last 168 hours. No results for  input(s): AMMONIA in the last 168 hours. Coagulation Profile: No results for input(s): INR, PROTIME in the last 168 hours. Cardiac Enzymes: No results for input(s): CKTOTAL, CKMB, CKMBINDEX, TROPONINI in the last 168 hours. BNP (last 3 results) No results for input(s): PROBNP in the last 8760 hours. HbA1C: No results for input(s): HGBA1C in the last 72 hours. CBG:  Recent Labs Lab 08/11/16 1639 08/11/16 2106 08/12/16 0742 08/12/16 1150 08/12/16 1646  GLUCAP 183* 163* 128* 168* 156*   Lipid Profile: No results for input(s): CHOL, HDL, LDLCALC, TRIG, CHOLHDL, LDLDIRECT in the last 72 hours. Thyroid Function Tests: No results for  input(s): TSH, T4TOTAL, FREET4, T3FREE, THYROIDAB in the last 72 hours. Anemia Panel: No results for input(s): VITAMINB12, FOLATE, FERRITIN, TIBC, IRON, RETICCTPCT in the last 72 hours. Sepsis Labs: No results for input(s): PROCALCITON, LATICACIDVEN in the last 168 hours.  No results found for this or any previous visit (from the past 240 hour(s)).       Radiology Studies: Ct Head Wo Contrast  Result Date: 08/11/2016 CLINICAL DATA:  Occipital headache. EXAM: CT HEAD WITHOUT CONTRAST TECHNIQUE: Contiguous axial images were obtained from the base of the skull through the vertex without intravenous contrast. COMPARISON:  None. FINDINGS: Brain: 48 x 45 x 38 mm extra-axial appearing mass in the left posterior fossa with broad contact on the inferior surface of the tentorium. There is peripheral and bands of central dense calcification in this otherwise isodense mass. There is marked mass effect on the left cerebellar hemisphere with partial fourth ventricular effacement and edematous changes in the inferior cerebellum. Cerebellar tonsillar descent with foramen magnum crowding. No hydrocephalus or hemorrhage. No acute infarct. Mild generalized cerebral volume loss and microvascular ischemic change in the cerebral white matter. Vascular: Atherosclerotic calcification. Skull: Prominent arachnoid granulation type changes to the left occipital bone near the mass. Sinuses/Orbits: Negative.  Bilateral cataract resection. These results were called by telephone at the time of interpretation on 08/11/2016 at 2:36 am to Dr. Thayer Jew , who verbally acknowledged these results. IMPRESSION: Nearly 5 cm partly calcified mass in the posterior fossa consistent with a meningioma. Extensive mass effect on the left cerebellum with mild edema, foramen magnum crowding, and incomplete fourth ventricular effacement. Electronically Signed   By: Monte Fantasia M.D.   On: 08/11/2016 02:36        Scheduled Meds: .  cholecalciferol  1,000 Units Oral Daily  . dexamethasone  4 mg Intravenous Q6H  . hydrochlorothiazide  25 mg Oral Daily  . insulin aspart  0-15 Units Subcutaneous TID WC  . insulin aspart  0-5 Units Subcutaneous QHS  . isosorbide mononitrate  15 mg Oral BID  . losartan  50 mg Oral Daily  . metoprolol tartrate  25 mg Oral BID  . oxyCODONE  10 mg Oral Q12H  . pantoprazole  40 mg Oral BID  . polyethylene glycol  17 g Oral Daily  . Rivaroxaban  15 mg Oral Q supper  . vitamin E  400 Units Oral Daily   Continuous Infusions:    LOS: 1 day     Cordelia Poche, MD [facility] 08/12/2016, 7:40 PM Pager: [phone]  If 7PM-7AM, please contact night-coverage [email or web address] Password [facility] 08/12/2016, 7:40 PM

## 2016-08-12 NOTE — Evaluation (Signed)
Physical Therapy Evaluation Patient Details Name: Ellen Peterson MRN: [id number] DOB: 03-27-1920 Today's Date: 08/12/2016   History of Present Illness  Ellen Peterson is a 82 y.o. female with medical history significant of HTN, LBBB (not due to CAD), PE on chronic xarelto, CKD stage 3. CT head reveals a mass in the cerebellum that is partially calcified, mild surrounding edema.  Probably a meningioma  Clinical Impression  The patient is very pel[place]. She ambulated with a RW x 60' with min guard. Required assistance with meals and bath PTA. Patient reports that she has caregivers and lots of family to assist. No family present to discuss. Patient states that she does not want to go to a SNF. Pt admitted with above diagnosis. Pt currently with functional limitations due to the deficits listed below (see PT Problem List).  Pt will benefit from skilled PT to increase their independence and safety with mobility to allow discharge to the venue listed below.       Follow Up Recommendations Home health PT;Supervision/Assistance - 24 hour    Equipment Recommendations  None recommended by PT    Recommendations for Other Services       Precautions / Restrictions Precautions Precautions: Fall      Mobility  Bed Mobility Overal bed mobility: Independent                Transfers Overall transfer level: Needs assistance Equipment used: Rolling walker (2 wheeled) Transfers: Sit to/from Stand Sit to Stand: Min assist         General transfer comment: steady assist for standing, extra effort to stand  Ambulation/Gait Ambulation/Gait assistance: Min assist/min guard Ambulation Distance (Feet): 60 Feet Assistive device: Rolling walker (2 wheeled) Gait Pattern/deviations: Step-through pattern;Shuffle Gait velocity: decreased   General Gait Details: at times RW  is far ahead. stops to reposition self.   Stairs            Wheelchair Mobility    Modified Rankin (Stroke  Patients Only)       Balance Overall balance assessment: Needs assistance Sitting-balance support: Feet supported;No upper extremity supported Sitting balance-Leahy Scale: Good     Standing balance support: During functional activity;Bilateral upper extremity supported Standing balance-Leahy Scale: Fair                               Pertinent Vitals/Pain Pain Assessment: 0-10 Pain Score: 2  Pain Location: head Pain Intervention(s): Monitored during session    Home Living Family/patient expects to be discharged to:: Private residence Living Arrangements: Children;Other relatives Available Help at Discharge: Family;Friend(s);Personal care attendant Type of Home: House Home Access: [place]: One level Home Equipment: [place] - 2 wheels;Walker - 4 wheels;Bedside commode;Shower seat Additional Comments: patient reports that family  are in/out all day, has family at night    Prior Function Level of Independence: Needs assistance   Gait / Transfers Assistance Needed: uses RW for household distances, minimal community amb  ADL's / Homemaking Assistance Needed: daughters assist with shower, has seat in shower        Hand Dominance        Extremity/Trunk Assessment   Upper Extremity Assessment: Generalized weakness           Lower Extremity Assessment: Generalized weakness      Cervical / Trunk Assessment: Kyphotic  Communication   Communication: No difficulties  Cognition Arousal/Alertness: Awake/alert Behavior During Therapy:  WFL for tasks assessed/performed Overall Cognitive Status: Within Functional Limits for tasks assessed                      General Comments      Exercises        Assessment/Plan    PT Assessment Patient needs continued PT services  PT Diagnosis Difficulty walking;Generalized weakness   PT Problem List Decreased strength;Decreased activity tolerance;Decreased mobility;Decreased  balance;Decreased knowledge of precautions;Decreased safety awareness;Decreased knowledge of use of DME  PT Treatment Interventions DME instruction;Gait training;Functional mobility training;Therapeutic activities;Therapeutic exercise;Patient/family education   PT Goals (Current goals can be found in the Care Plan section) Acute Rehab PT Goals Patient Stated Goal: to go home PT Goal Formulation: With patient Time For Goal Achievement: 08/26/16 Potential to Achieve Goals: Good    Frequency Min 3X/week   Barriers to discharge        Co-evaluation               End of Session Equipment Utilized During Treatment: Gait belt Activity Tolerance: Patient tolerated treatment well Patient left: in chair;with call bell/phone within reach;with chair alarm set Nurse Communication: Mobility status         Time: [id number] PT Time Calculation (min) (ACUTE ONLY): 23 min   Charges:   PT Evaluation $PT Eval Low Complexity: 1 Procedure PT Treatments $Gait Training: 8-22 mins   PT G CodesClaretha Cooper 08/12/2016, 9:12 AM Tresa Endo PT [phone]

## 2016-08-12 NOTE — Care Management Note (Signed)
Case Management Note  Patient Details  Name: Ellen Peterson MRN: [id number] Date of Birth: 1920/01/17  Subjective/Objective:     82 yo admitted with Meningioma of cerebellum               Action/Plan: From home with daughter. Pt states she has used the services of [facility] previously and would like to use them again. She states she has a walker at home and a stool in her tub. No other equipment needs communicated. [facility] rep contacted for referral. Will need HHPT/Aide order at DC. CM will continue to follow.  Expected Discharge Date:                  Expected Discharge Plan:  [place]  In-House Referral:     Discharge planning Services  CM Consult  Post Acute Care Choice:  Home Health Choice offered to:  Patient, Adult Children  DME Arranged:    DME Agency:     HH Arranged:  PT, Nurse's Aide [place] Agency:  [facility] (now [facility])  Status of Service:  In process, will continue to follow  If discussed at Long Length of Stay Meetings, dates discussed:    Additional CommentsLynnell Catalan, RN 08/12/2016, 1:19 PM  [phone]

## 2016-08-13 LAB — GLUCOSE, CAPILLARY: Glucose-Capillary: 121 mg/dL — ABNORMAL HIGH (ref 65–99)

## 2016-08-13 MED ORDER — POLYETHYLENE GLYCOL 3350 17 G PO PACK: 17.0000 g | PACK | Freq: Every day | ORAL | 0 refills | Status: AC

## 2016-08-13 MED ORDER — DIAZEPAM 2 MG PO TABS: 2.0000 mg | ORAL_TABLET | Freq: Four times a day (QID) | ORAL | 0 refills | Status: DC | PRN

## 2016-08-13 MED ORDER — DEXAMETHASONE 2 MG PO TABS: ORAL_TABLET | ORAL | 0 refills | Status: AC

## 2016-08-13 MED ORDER — DEXAMETHASONE 4 MG PO TABS: 4.0000 mg | ORAL_TABLET | Freq: Two times a day (BID) | ORAL | Status: DC

## 2016-08-13 MED ORDER — OXYCODONE HCL ER 10 MG PO T12A: 10.0000 mg | EXTENDED_RELEASE_TABLET | Freq: Two times a day (BID) | ORAL | 0 refills | Status: DC

## 2016-08-13 MED ORDER — OXYCODONE-ACETAMINOPHEN 5-325 MG PO TABS: 1.0000 | ORAL_TABLET | ORAL | 0 refills | Status: AC | PRN

## 2016-08-13 NOTE — Discharge Summary (Signed)
Physician Discharge Summary  Ellen Peterson [id number] DOB: June 12, 1920 DOA: 08/10/2016  PCP: [name], MD  Admit date: 08/10/2016 Discharge date: 08/13/2016  Admitted From: Home Disposition:  Home  Recommendations for Outpatient Follow-up:  1. Follow up with PCP in 1-2 weeks 2. Please obtain BMP/CBC in one week 3. Please follow up on the following pending results:  Home Health: Yes: I have laid eyes on patient and recommend home health physical therapy and aide Equipment/Devices:None  Discharge Condition: Stable CODE STATUS: Full Code Diet recommendation: Regular  Brief/Interim Summary: Ellen Peterson an 82 y.o.femalewith a PMH of hypertension, left bundle branch block, pulmonary embolism on chronic Xarelto and stage III chronic kidney disease who was admitted 08/10/16 with a chief complaint of a 4 day history of headache. CT of the head revealed a mass in the cerebellum, partially calcified, with mild surrounding edema suspected to be a meningioma.  Discharge Diagnoses:  Principal Problem:   Meningioma of cerebellum ([place]) Active Problems:   Hyperglycemia   History of pulmonary embolism   Essential hypertension, benign   CAD (coronary artery disease), native coronary artery   Bad headache   Brain mass   Neck pain  Meningioma of cerebellum Headache Headache improved with oxycontin 10mg  q12 hours. Not a surgical candidate which was discussed with family on 08/10/16. Current consensus is to not pursue aggressive evaluation. Home with Oxycontin scheduled and Percocet prn  Hypertension continued hydrochlorothiazide, metoprolol and imdur  Hyperglycemia Sliding scale while inpatient  History of PE Continued Xarelto  Discharge Instructions     Medication List    STOP taking these medications   diphenhydrAMINE 25 mg capsule Commonly known as:  BENADRYL     TAKE these medications   acetaminophen 500 MG tablet Commonly known as:  TYLENOL Take 500 mg by mouth  every 6 (six) hours as needed (pain).   cholecalciferol 1000 units tablet Commonly known as:  VITAMIN D Take 1,000 Units by mouth daily.   CINNAMON PO Take 1,000 mg by mouth daily.   dexamethasone 2 MG tablet Commonly known as:  DECADRON Take 4mg  (2 tablets) twice daily for 7 days (8/27-9/2). Then Take 2mg  (1 tablet) twice daily for 7 days (9/3-9/9). Then take 1mg  (half a tablet) twice daily for 7 days (8/10-8/16) Start taking on:  08/14/2016   diazepam 2 MG tablet Commonly known as:  VALIUM Take 1 tablet (2 mg total) by mouth every 6 (six) hours as needed for muscle spasms (headache).   docusate sodium 100 MG capsule Commonly known as:  COLACE Take 100 mg by mouth daily as needed for mild constipation.   Fish Oil 1000 MG Caps Take 1,000 mg by mouth daily.   furosemide 20 MG tablet Commonly known as:  LASIX Take 1 tablet (20 mg total) by mouth daily as needed for edema.   hydrochlorothiazide 25 MG tablet Commonly known as:  HYDRODIURIL Take 1 tablet (25 mg total) by mouth daily.   ICAPS PO Take 1 capsule by mouth daily.   MULTIVITAMIN & MINERAL PO Take 1 tablet by mouth daily.   isosorbide mononitrate 30 MG 24 hr tablet Commonly known as:  IMDUR TAKE 0.5 TABLET (15 mg) BY MOUTH BID   losartan 50 MG tablet Commonly known as:  COZAAR Take 1 tablet (50 mg total) by mouth daily.   metoprolol tartrate 25 MG tablet Commonly known as:  LOPRESSOR Take 1 tablet (25 mg total) by mouth 2 (two) times daily.   NITROSTAT 0.4 MG SL tablet  Generic drug:  nitroGLYCERIN DISSOLVE 1 TABLET UNDER THE TONGUE FOR CHEST PAIN. MAY REPEAT EVERY 5MINUTES UP TO 3 DOSES. IF NO RELIEF, CALL 911**   oxyCODONE 10 mg 12 hr tablet Commonly known as:  OXYCONTIN Take 1 tablet (10 mg total) by mouth every 12 (twelve) hours.   oxyCODONE-acetaminophen 5-325 MG tablet Commonly known as:  PERCOCET/ROXICET Take 1-2 tablets by mouth every 4 (four) hours as needed for moderate pain or severe pain  (One tab for mod pain, 2 for severe pain).   pantoprazole 40 MG tablet Commonly known as:  PROTONIX Take 1 tablet (40 mg total) by mouth 2 (two) times daily.   polyethylene glycol packet Commonly known as:  MIRALAX / GLYCOLAX Take 17 g by mouth daily. Increase to twice daily if infrequent or hard stools.   Rivaroxaban 15 MG Tabs tablet Commonly known as:  XARELTO Take 1 tablet (15 mg total) by mouth daily with supper. Starting 07/08/14   traMADol 50 MG tablet Commonly known as:  ULTRAM Take 1 tablet (50 mg total) by mouth every 8 (eight) hours as needed.   vitamin E 400 UNIT capsule Take 400 Units by mouth daily.   VOLTAREN 1 % Gel Generic drug:  diclofenac sodium Apply 2 g topically 2 (two) times daily as needed (knee pain).      Follow-up Information    [facility] .   Contact information: [address] [place] [address] [phone]        Marne Tower, MD. Schedule an appointment as soon as possible for a visit in 1 week(s).   Specialties:  Family Medicine, Radiology Contact information: [address] [place] [address] [phone]          Allergies  Allergen Reactions  . Metronidazole Hives  . Nitrofurantoin Hives  . Nsaids Other (See Comments)    REACTION: GI symptoms  . Rofecoxib Other (See Comments)    REACTION: GI symptoms  . Simvastatin Other (See Comments)    REACTION: myalgia  . Erythromycin Rash  . Metformin Diarrhea and Nausea And Vomiting  . Sulfamethoxazole-Trimethoprim Itching  . Tetracycline Rash    Consultations:  None   Procedures/Studies: Ct Head Wo Contrast  Result Date: 08/11/2016 CLINICAL DATA:  Occipital headache. EXAM: CT HEAD WITHOUT CONTRAST TECHNIQUE: Contiguous axial images were obtained from the base of the skull through the vertex without intravenous contrast. COMPARISON:  None. FINDINGS: Brain: 48 x 45 x 38 mm extra-axial appearing mass in the left  posterior fossa with broad contact on the inferior surface of the tentorium. There is peripheral and bands of central dense calcification in this otherwise isodense mass. There is marked mass effect on the left cerebellar hemisphere with partial fourth ventricular effacement and edematous changes in the inferior cerebellum. Cerebellar tonsillar descent with foramen magnum crowding. No hydrocephalus or hemorrhage. No acute infarct. Mild generalized cerebral volume loss and microvascular ischemic change in the cerebral white matter. Vascular: Atherosclerotic calcification. Skull: Prominent arachnoid granulation type changes to the left occipital bone near the mass. Sinuses/Orbits: Negative.  Bilateral cataract resection. These results were called by telephone at the time of interpretation on 08/11/2016 at 2:36 am to Dr. Thayer Jew , who verbally acknowledged these results. IMPRESSION: Nearly 5 cm partly calcified mass in the posterior fossa consistent with a meningioma. Extensive mass effect on the left cerebellum with mild edema, foramen magnum crowding, and incomplete fourth ventricular effacement. Electronically Signed   By: Angelica Chessman  Watts M.D.   On: 08/11/2016 02:36      Subjective: Patient reports no current headache. Her symptoms have been very controlled with scheduled OxyContin.  Discharge Exam: Vitals:   08/12/16 2006 08/13/16 0522  BP: (!) 107/54 137/80  Pulse: 69 62  Resp: 18 16  Temp: 97.6 F (36.4 C) 97.8 F (36.6 C)   Vitals:   08/12/16 0941 08/12/16 1416 08/12/16 2006 08/13/16 0522  BP: 133/61 (!) 108/59 (!) 107/54 137/80  Pulse: 81 (!) 58 69 62  Resp:   18 16  Temp:  98.9 F (37.2 C) 97.6 F (36.4 C) 97.8 F (36.6 C)  TempSrc:  Oral Oral Oral  SpO2:  99% 99% 99%    General: Pt is alert, awake, not in acute distress Cardiovascular: RRR, S1/S2 +, no rubs, no gallops Respiratory: CTA bilaterally, no wheezing, no rhonchi Abdominal: Soft, NT, ND, bowel sounds  + Extremities: no edema, no cyanosis    The results of significant diagnostics from this hospitalization (including imaging, microbiology, ancillary and laboratory) are listed below for reference.     Microbiology: No results found for this or any previous visit (from the past 240 hour(s)).   Labs: BNP (last 3 results) No results for input(s): BNP in the last 8760 hours. Basic Metabolic Panel:  Recent Labs Lab 08/11/16 0001  NA 139  K 3.8  CL 99*  GLUCOSE 98  BUN 35*  CREATININE 1.20*   Liver Function Tests: No results for input(s): AST, ALT, ALKPHOS, BILITOT, PROT, ALBUMIN in the last 168 hours. No results for input(s): LIPASE, AMYLASE in the last 168 hours. No results for input(s): AMMONIA in the last 168 hours. CBC:  Recent Labs Lab 08/10/16 2352 08/11/16 0001  WBC 6.3  --   NEUTROABS 4.0  --   HGB 11.8* 12.9  HCT 35.2* 38.0  MCV 93.6  --   PLT 227  --    Cardiac Enzymes: No results for input(s): CKTOTAL, CKMB, CKMBINDEX, TROPONINI in the last 168 hours. BNP: Invalid input(s): POCBNP CBG:  Recent Labs Lab 08/12/16 0742 08/12/16 1150 08/12/16 1646 08/12/16 2217 08/13/16 0801  GLUCAP 128* 168* 156* 175* 121*   D-Dimer No results for input(s): DDIMER in the last 72 hours. Hgb A1c No results for input(s): HGBA1C in the last 72 hours. Lipid Profile No results for input(s): CHOL, HDL, LDLCALC, TRIG, CHOLHDL, LDLDIRECT in the last 72 hours. Thyroid function studies No results for input(s): TSH, T4TOTAL, T3FREE, THYROIDAB in the last 72 hours.  Invalid input(s): FREET3 Anemia work up No results for input(s): VITAMINB12, FOLATE, FERRITIN, TIBC, IRON, RETICCTPCT in the last 72 hours. Urinalysis    Component Value Date/Time   COLORURINE YELLOW 12/25/2015 1557   APPEARANCEUR CLEAR 12/25/2015 1557   APPEARANCEUR HAZY 07/02/2014 0011   LABSPEC 1.019 12/25/2015 1557   LABSPEC 1.010 07/02/2014 0011   PHURINE 6.0 12/25/2015 1557   GLUCOSEU NEGATIVE  12/25/2015 1557   GLUCOSEU NEGATIVE 07/02/2014 0011   HGBUR NEGATIVE 12/25/2015 1557   HGBUR moderate 09/20/2010 1406   BILIRUBINUR NEGATIVE 12/25/2015 1557   BILIRUBINUR Neg. 11/11/2015 1424   BILIRUBINUR NEGATIVE 07/02/2014 0011   KETONESUR NEGATIVE 12/25/2015 1557   PROTEINUR NEGATIVE 12/25/2015 1557   UROBILINOGEN 0.2 11/11/2015 1424   UROBILINOGEN 0.2 07/21/2011 0049   NITRITE POSITIVE (A) 12/25/2015 1557   LEUKOCYTESUR SMALL (A) 12/25/2015 1557   LEUKOCYTESUR 3+ 07/02/2014 0011   Sepsis Labs Invalid input(s): PROCALCITONIN,  WBC,  LACTICIDVEN Microbiology No results found for this or any  previous visit (from the past 240 hour(s)).   Time coordinating discharge: Over 30 minutes  SIGNED:   Cordelia Poche, MD  [facility] 08/13/2016, 8:51 AM Pager [phone]  If 7PM-7AM, please contact night-coverage [email or web address] Password [email or web address]

## 2016-08-13 NOTE — Discharge Instructions (Signed)
Ms. Riggan, we saw you for your headache secondary to a meningioma. We gave you medications to adequately control your pain. Please continue this regimen at home. Please return or see your primary care physician is symptoms worsen.

## 2016-08-13 NOTE — Progress Notes (Signed)
Patient discharged to home, all discharge medications and instructions reviewed and questions answered.  Patient to be assisted to vehicle by wheelchair.  

## 2016-08-15 ENCOUNTER — Telehealth: Payer: Self-pay | Admitting: *Deleted

## 2016-08-15 DIAGNOSIS — M542 Cervicalgia: Secondary | ICD-10-CM | POA: Diagnosis not present

## 2016-08-15 DIAGNOSIS — T380X5D Adverse effect of glucocorticoids and synthetic analogues, subsequent encounter: Secondary | ICD-10-CM | POA: Diagnosis not present

## 2016-08-15 DIAGNOSIS — I5032 Chronic diastolic (congestive) heart failure: Secondary | ICD-10-CM | POA: Diagnosis not present

## 2016-08-15 DIAGNOSIS — M170 Bilateral primary osteoarthritis of knee: Secondary | ICD-10-CM | POA: Diagnosis not present

## 2016-08-15 DIAGNOSIS — I130 Hypertensive heart and chronic kidney disease with heart failure and stage 1 through stage 4 chronic kidney disease, or unspecified chronic kidney disease: Secondary | ICD-10-CM | POA: Diagnosis not present

## 2016-08-15 DIAGNOSIS — G43909 Migraine, unspecified, not intractable, without status migrainosus: Secondary | ICD-10-CM | POA: Diagnosis not present

## 2016-08-15 DIAGNOSIS — E1165 Type 2 diabetes mellitus with hyperglycemia: Secondary | ICD-10-CM | POA: Diagnosis not present

## 2016-08-15 DIAGNOSIS — M6281 Muscle weakness (generalized): Secondary | ICD-10-CM | POA: Diagnosis not present

## 2016-08-15 DIAGNOSIS — N183 Chronic kidney disease, stage 3 (moderate): Secondary | ICD-10-CM | POA: Diagnosis not present

## 2016-08-15 DIAGNOSIS — D320 Benign neoplasm of cerebral meninges: Secondary | ICD-10-CM | POA: Diagnosis not present

## 2016-08-15 DIAGNOSIS — I447 Left bundle-branch block, unspecified: Secondary | ICD-10-CM | POA: Diagnosis not present

## 2016-08-15 DIAGNOSIS — E1151 Type 2 diabetes mellitus with diabetic peripheral angiopathy without gangrene: Secondary | ICD-10-CM | POA: Diagnosis not present

## 2016-08-15 DIAGNOSIS — E1122 Type 2 diabetes mellitus with diabetic chronic kidney disease: Secondary | ICD-10-CM | POA: Diagnosis not present

## 2016-08-15 NOTE — Telephone Encounter (Signed)
Thanks for the update and let me know if they need paperwork/orders

## 2016-08-15 NOTE — Telephone Encounter (Signed)
Cindy with [facility] left a voicemail that patient was discharged from the hospital with an order for PT. Jenny Reichmann wanted to let Dr. Glori Bickers know that PT was started today.

## 2016-08-16 ENCOUNTER — Ambulatory Visit (INDEPENDENT_AMBULATORY_CARE_PROVIDER_SITE_OTHER): Payer: Medicare Other | Admitting: Family Medicine

## 2016-08-16 ENCOUNTER — Encounter: Payer: Self-pay | Admitting: Family Medicine

## 2016-08-16 ENCOUNTER — Telehealth: Payer: Self-pay

## 2016-08-16 VITALS — BP 122/72 | HR 57 | Temp 98.5°F | Ht 60.0 in

## 2016-08-16 DIAGNOSIS — R519 Headache, unspecified: Secondary | ICD-10-CM

## 2016-08-16 DIAGNOSIS — R51 Headache: Secondary | ICD-10-CM | POA: Diagnosis not present

## 2016-08-16 DIAGNOSIS — Z86711 Personal history of pulmonary embolism: Secondary | ICD-10-CM

## 2016-08-16 DIAGNOSIS — Z7409 Other reduced mobility: Secondary | ICD-10-CM | POA: Diagnosis not present

## 2016-08-16 DIAGNOSIS — I5032 Chronic diastolic (congestive) heart failure: Secondary | ICD-10-CM

## 2016-08-16 DIAGNOSIS — D320 Benign neoplasm of cerebral meninges: Secondary | ICD-10-CM | POA: Diagnosis not present

## 2016-08-16 DIAGNOSIS — N289 Disorder of kidney and ureter, unspecified: Secondary | ICD-10-CM

## 2016-08-16 DIAGNOSIS — M170 Bilateral primary osteoarthritis of knee: Secondary | ICD-10-CM

## 2016-08-16 DIAGNOSIS — I272 Other secondary pulmonary hypertension: Secondary | ICD-10-CM

## 2016-08-16 DIAGNOSIS — R739 Hyperglycemia, unspecified: Secondary | ICD-10-CM

## 2016-08-16 MED ORDER — DIAZEPAM 2 MG PO TABS: 2.0000 mg | ORAL_TABLET | Freq: Four times a day (QID) | ORAL | 0 refills | Status: DC | PRN

## 2016-08-16 MED ORDER — OXYCODONE HCL ER 10 MG PO T12A: 10.0000 mg | EXTENDED_RELEASE_TABLET | Freq: Two times a day (BID) | ORAL | 0 refills | Status: AC

## 2016-08-16 NOTE — Patient Instructions (Addendum)
Find out from insurance company exactly what brand of glucose meter and strips and lancets are covered  Then I will get the px to the pharmacy  Then check some blood sugars randomly - some fasting in am and some 2 hours after a meal   Try holding the tramadol to see if you really need it   I will work on a hospice referral and our office will call   Here are refills of the oxycontin and valium - use with caution- do not walk without assistance  Have the pharmacy get me a prior auth if needed for coverage   Continue miralax

## 2016-08-16 NOTE — Telephone Encounter (Signed)
Ellen Peterson PT with [facility] left v/m requesting order home health PT 3 x a week for 2 weeks for strengthening and balance and transfers. Ellen Peterson spoke with pt and pts family about possibility of using hospice resources but pt wants to try PT now even though pt is weak.

## 2016-08-16 NOTE — Telephone Encounter (Signed)
Ellen Peterson was given verbal order

## 2016-08-16 NOTE — Progress Notes (Signed)
Subjective:    Patient ID: Ellen Peterson, female    DOB: Sep 06, 1920, 82 y.o.   MRN: [id number]  HPI Here for f/u of hospitalization from 8/23 to 08/13/16 She presented with a 4 day history of headache CT of the head revealed a partially calcified mass in the cerebellum with edema surrounding- consistent with meningioma   In hospital she was treated with decadron and valium and Dr Cyndy Freeze was consulted   Consensus was not to pursue aggressive evaluation  Was tx with oxycontin 10 mg Q12 hours  On decadron for about a month  Not a surgical candidate due to age and blood thinner status/hx of PE  Ct Head Wo Contrast  Result Date: 08/11/2016 CLINICAL DATA:  Occipital headache. EXAM: CT HEAD WITHOUT CONTRAST TECHNIQUE: Contiguous axial images were obtained from the base of the skull through the vertex without intravenous contrast. COMPARISON:  None. FINDINGS: Brain: 48 x 45 x 38 mm extra-axial appearing mass in the left posterior fossa with broad contact on the inferior surface of the tentorium. There is peripheral and bands of central dense calcification in this otherwise isodense mass. There is marked mass effect on the left cerebellar hemisphere with partial fourth ventricular effacement and edematous changes in the inferior cerebellum. Cerebellar tonsillar descent with foramen magnum crowding. No hydrocephalus or hemorrhage. No acute infarct. Mild generalized cerebral volume loss and microvascular ischemic change in the cerebral white matter. Vascular: Atherosclerotic calcification. Skull: Prominent arachnoid granulation type changes to the left occipital bone near the mass. Sinuses/Orbits: Negative.  Bilateral cataract resection. These results were called by telephone at the time of interpretation on 08/11/2016 at 2:36 am to Dr. Thayer Jew , who verbally acknowledged these results. IMPRESSION: Nearly 5 cm partly calcified mass in the posterior fossa consistent with a meningioma. Extensive mass  effect on the left cerebellum with mild edema, foramen magnum crowding, and incomplete fourth ventricular effacement. Electronically Signed   By: Monte Fantasia M.D.   On: 08/11/2016 02:36    Admission on 08/10/2016, Discharged on 08/13/2016  Component Date Value Ref Range Status  . Sodium 08/11/2016 139  135 - 145 mmol/L Final  . Potassium 08/11/2016 3.8  3.5 - 5.1 mmol/L Final  . Chloride 08/11/2016 99* 101 - 111 mmol/L Final  . BUN 08/11/2016 35* 6 - 20 mg/dL Final  . Creatinine, Ser 08/11/2016 1.20* 0.44 - 1.00 mg/dL Final  . Glucose, Bld 08/11/2016 98  65 - 99 mg/dL Final  . Calcium, Ion 08/11/2016 1.19  1.12 - 1.23 mmol/L Final  . TCO2 08/11/2016 24  0 - 100 mmol/L Final  . Hemoglobin 08/11/2016 12.9  12.0 - 15.0 g/dL Final  . HCT 08/11/2016 38.0  36.0 - 46.0 % Final  . WBC 08/11/2016 6.3  4.0 - 10.5 K/uL Final  . RBC 08/11/2016 3.76* 3.87 - 5.11 MIL/uL Final  . Hemoglobin 08/11/2016 11.8* 12.0 - 15.0 g/dL Final  . HCT 08/11/2016 35.2* 36.0 - 46.0 % Final  . MCV 08/11/2016 93.6  78.0 - 100.0 fL Final  . MCH 08/11/2016 31.4  26.0 - 34.0 pg Final  . MCHC 08/11/2016 33.5  30.0 - 36.0 g/dL Final  . RDW 08/11/2016 13.9  11.5 - 15.5 % Final  . Platelets 08/11/2016 227  150 - 400 K/uL Final  . Neutrophils Relative % 08/11/2016 64  % Final  . Neutro Abs 08/11/2016 4.0  1.7 - 7.7 K/uL Final  . Lymphocytes Relative 08/11/2016 18  % Final  . Lymphs Abs 08/11/2016  1.1  0.7 - 4.0 K/uL Final  . Monocytes Relative 08/11/2016 17  % Final  . Monocytes Absolute 08/11/2016 1.1* 0.1 - 1.0 K/uL Final  . Eosinophils Relative 08/11/2016 1  % Final  . Eosinophils Absolute 08/11/2016 0.1  0.0 - 0.7 K/uL Final  . Basophils Relative 08/11/2016 0  % Final  . Basophils Absolute 08/11/2016 0.0  0.0 - 0.1 K/uL Final  . Glucose-Capillary 08/11/2016 163* 65 - 99 mg/dL Final  . Glucose-Capillary 08/11/2016 200* 65 - 99 mg/dL Final  . Comment 1 08/11/2016 Notify RN   Final  . Comment 2 08/11/2016 Document in  Chart   Final  . Glucose-Capillary 08/11/2016 183* 65 - 99 mg/dL Final  . Glucose-Capillary 08/11/2016 163* 65 - 99 mg/dL Final  . Comment 1 08/11/2016 Notify RN   Final  . Glucose-Capillary 08/12/2016 128* 65 - 99 mg/dL Final  . Glucose-Capillary 08/12/2016 168* 65 - 99 mg/dL Final  . Glucose-Capillary 08/12/2016 156* 65 - 99 mg/dL Final  . Glucose-Capillary 08/12/2016 175* 65 - 99 mg/dL Final  . Comment 1 08/12/2016 Notify RN   Final  . Glucose-Capillary 08/13/2016 121* 65 - 99 mg/dL Final  . Comment 1 08/13/2016 Notify RN   Final     Blood sugar was controlled with SSI Cr was stable  Mild anemia   PT evaluated and home PT was recommended Also assitance 24 hours   Lab Results  Component Value Date   HGBA1C 6.4 03/04/2016   On decadron taper - needs DM testing equipment to check sugar   Also valium for spasms and also for anxiety -wants to continue that - they are dosing twice daily   oxycontin helps- but it does cause sleepiness  Has oxycodone for breakthrough pain   Still takes tramadol for her knees -may not need it with the oxy   They are open to hospice or palliative care  PT came yesterday to do the assessment - will start for 2 weeks to begin  Neuro surgeon did not require f/u since she is not a surgical candidate and also did not recommend MRI since it would not change management   On miralax every day for constipation-has had BMs   Patient Active Problem List   Diagnosis Date Noted  . Meningioma of cerebellum ([place]) 08/11/2016  . Bad headache   . Brain mass   . Neck pain   . Routine general medical examination at a health care facility 03/11/2016  . Mobility impaired 01/28/2016  . Osteoarthritis of both knees 01/28/2016  . Cognitive decline 01/28/2016  . CKD (chronic kidney disease), stage III 11/27/2015  . Chronic diastolic heart failure ([place]) 11/27/2015  . Left groin pain 11/11/2015  . History of DVT (deep vein thrombosis) 07/23/2014  . CAD (coronary  artery disease), native coronary artery 07/23/2014  . Pulmonary hypertension ([place]) 07/23/2014  . Right ventricular dysfunction 07/23/2014  . HX: anticoagulation 07/23/2014  . Dysphasia 07/23/2014  . Ejection fraction   . Essential hypertension, benign 06/22/2014  . GERD (gastroesophageal reflux disease) 06/22/2014  . Physical deconditioning 06/22/2014  . History of pulmonary embolism 06/15/2014  . Encounter for Medicare annual wellness exam 08/08/2013  . LBBB (left bundle branch block)   . Hyperlipidemia   . Wide-complex tachycardia ([place])   . Venous insufficiency 06/03/2011  . Hyperglycemia 11/17/2010  . Anemia 02/08/2010  . Renal insufficiency 10/27/2009  . Recurrent UTI 09/22/2008  . Vitamin D deficiency 09/10/2008  . COLONIC POLYPS, ADENOMATOUS 04/25/2007  . PERIPHERAL VASCULAR  DISEASE 04/25/2007  . DIVERTICULOSIS, COLON 04/25/2007  . IBS 04/25/2007  . Osteoarthritis 04/25/2007  . Polymyalgia rheumatica ([place]) 04/25/2007   Past Medical History:  Diagnosis Date  . Chest tightness    catheterization, 2005, normal coronaries  . Complication of anesthesia   . Diverticulosis of colon   . Ejection fraction < 50%    EF 40% echo, 09/2009  . Enteritis 11/2015  . Frequent UTI   . GERD (gastroesophageal reflux disease)   . Hyperlipidemia   . Hypertension   . Hypokalemia   . LBBB (left bundle branch block)    Since at least 2005  . OA (osteoarthritis)   . PE (pulmonary embolism) 06/16/2014  . Peripheral vascular disease ([place])   . PMR (polymyalgia rheumatica) ([facility])   . PONV (postoperative nausea and vomiting)   . Recurrent UTI   . Shortness of breath   . Wide-complex tachycardia ([facility])    SVT in past   Past Surgical History:  Procedure Laterality Date  . APPENDECTOMY  1993  . BACK SURGERY    . bilateral pyelonephritis    . Biliroth I  1993  . bladder tack up    . CARDIAC CATHETERIZATION  2005   no significant CAD  . CHOLECYSTECTOMY    . COLECTOMY  1993   partial  .  ENTEROSCOPY N/A 11/29/2015   Procedure: ENTEROSCOPY;  Surgeon: Ladene Artist, MD;  Location: [facility] ENDOSCOPY;  Service: Endoscopy;  Laterality: N/A;  . ESOPHAGOGASTRODUODENOSCOPY  05/2004   baretts esophagus, stomach polyps  . LEFT HEART CATHETERIZATION WITH CORONARY ANGIOGRAM N/A 06/12/2014   Procedure: LEFT HEART CATHETERIZATION WITH CORONARY ANGIOGRAM;  Surgeon: Peter M Martinique, MD;  Location: [facility] CATH LAB;  Service: Cardiovascular;  Laterality: N/A;  . TONSILLECTOMY    . TOTAL ABDOMINAL HYSTERECTOMY  1960s   fibroids   . VEIN LIGATION AND STRIPPING     Social History  Substance Use Topics  . Smoking status: Never Smoker  . Smokeless tobacco: Never Used  . Alcohol use No   Family History  Problem Relation Age of Onset  . CAD Mother   . Hypertension Mother   . Stroke Mother   . Heart attack Mother   . Pancreatic cancer Sister   . Diabetes Brother   . Heart Problems Brother   . Heart attack Brother    Allergies  Allergen Reactions  . Metronidazole Hives  . Nitrofurantoin Hives  . Nsaids Other (See Comments)    REACTION: GI symptoms  . Rofecoxib Other (See Comments)    REACTION: GI symptoms  . Simvastatin Other (See Comments)    REACTION: myalgia  . Erythromycin Rash  . Metformin Diarrhea and Nausea And Vomiting  . Sulfamethoxazole-Trimethoprim Itching  . Tetracycline Rash   Current Outpatient Prescriptions on File Prior to Visit  Medication Sig Dispense Refill  . acetaminophen (TYLENOL) 500 MG tablet Take 500 mg by mouth every 6 (six) hours as needed (pain).     . cholecalciferol (VITAMIN D) 1000 UNITS tablet Take 1,000 Units by mouth daily.      Marland Kitchen CINNAMON PO Take 1,000 mg by mouth daily.    Marland Kitchen dexamethasone (DECADRON) 2 MG tablet Take 4mg  (2 tablets) twice daily for 7 days (8/27-9/2). Then Take 2mg  (1 tablet) twice daily for 7 days (9/3-9/9). Then take 1mg  (half a tablet) twice daily for 7 days (8/10-8/16) 49 tablet 0  . docusate sodium (COLACE) 100 MG capsule Take 100  mg by mouth daily as needed for mild constipation.    Marland Kitchen  furosemide (LASIX) 20 MG tablet Take 1 tablet (20 mg total) by mouth daily as needed for edema. (Patient taking differently: Take 20 mg by mouth every other day. ) 90 tablet 3  . hydrochlorothiazide (HYDRODIURIL) 25 MG tablet Take 1 tablet (25 mg total) by mouth daily. 90 tablet 3  . isosorbide mononitrate (IMDUR) 30 MG 24 hr tablet TAKE 0.5 TABLET (15 mg) BY MOUTH BID 30 tablet 11  . losartan (COZAAR) 50 MG tablet Take 1 tablet (50 mg total) by mouth daily. 90 tablet 3  . metoprolol tartrate (LOPRESSOR) 25 MG tablet Take 1 tablet (25 mg total) by mouth 2 (two) times daily. 60 tablet 11  . Multiple Vitamins-Minerals (ICAPS PO) Take 1 capsule by mouth daily.    . Multiple Vitamins-Minerals (MULTIVITAMIN & MINERAL PO) Take 1 tablet by mouth daily.    Marland Kitchen NITROSTAT 0.4 MG SL tablet DISSOLVE 1 TABLET UNDER THE TONGUE FOR CHEST PAIN. MAY REPEAT EVERY 5MINUTES UP TO 3 DOSES. IF NO RELIEF, CALL 911** 25 tablet 6  . Omega-3 Fatty Acids (FISH OIL) 1000 MG CAPS Take 1,000 mg by mouth daily.    Marland Kitchen oxyCODONE-acetaminophen (PERCOCET/ROXICET) 5-325 MG tablet Take 1-2 tablets by mouth every 4 (four) hours as needed for moderate pain or severe pain (One tab for mod pain, 2 for severe pain). 15 tablet 0  . pantoprazole (PROTONIX) 40 MG tablet Take 1 tablet (40 mg total) by mouth 2 (two) times daily. 180 tablet 3  . polyethylene glycol (MIRALAX / GLYCOLAX) packet Take 17 g by mouth daily. Increase to twice daily if infrequent or hard stools. 30 each 0  . Rivaroxaban (XARELTO) 15 MG TABS tablet Take 1 tablet (15 mg total) by mouth daily with supper. Starting 07/08/14 90 tablet 2  . traMADol (ULTRAM) 50 MG tablet Take 1 tablet (50 mg total) by mouth every 8 (eight) hours as needed. 90 tablet 3  . vitamin E 400 UNIT capsule Take 400 Units by mouth daily.      . VOLTAREN 1 % GEL Apply 2 g topically 2 (two) times daily as needed (knee pain). 6 Tube 3   No current  facility-administered medications on file prior to visit.      Review of Systems Review of Systems  Constitutional: Negative for fever, appetite change, and unexpected weight change. pos for fatigue and sleepiness Eyes: Negative for pain and visual disturbance.  Respiratory: Negative for cough and shortness of breath.   Cardiovascular: Negative for cp or palpitations    Gastrointestinal: Negative for nausea, diarrhea and constipation (with miralax).  Genitourinary: Negative for urgency and frequency.  Skin: Negative for pallor or rash   Neurological: Negative for weakness, light-headedness, numbness and pos for headache which is better controlled now , pos for ataxia and poor balance   Hematological: Negative for adenopathy. Does not bruise/bleed easily.  Psychiatric/Behavioral: Negative for dysphoric mood. The patient is not nervous/anxious.  Pos for cognitive decline        Objective:   Physical Exam  Constitutional: She appears well-developed and well-nourished. No distress.  Frail appearing elderly female in wheelchair  Unable to ambulate w/o assistance Generally weak  HENT:  Head: Normocephalic and atraumatic.  Nose: Nose normal.  Mouth/Throat: Oropharynx is clear and moist.  Eyes: Conjunctivae and EOM are normal. Pupils are equal, round, and reactive to light. Right eye exhibits no discharge. Left eye exhibits no discharge. No scleral icterus.  Neck: Normal range of motion. Neck supple. No JVD present. Carotid bruit is  not present. No tracheal deviation present. No thyromegaly present.  Cardiovascular: Normal rate, regular rhythm, normal heart sounds and intact distal pulses.  Exam reveals no gallop.   Pulmonary/Chest: Effort normal and breath sounds normal. No respiratory distress. She has no wheezes. She has no rales.  No crackles  Abdominal: Soft. Bowel sounds are normal. She exhibits no distension, no abdominal bruit and no mass. There is no tenderness.  Musculoskeletal:  She exhibits no edema or tenderness.  Lymphadenopathy:    She has no cervical adenopathy.  Neurological: She is alert. She has normal reflexes. She displays no atrophy and no tremor. No cranial nerve deficit. She exhibits normal muscle tone. Coordination and gait abnormal.  Ataxia     Skin: Skin is warm and dry. No rash noted. No pallor.  Psychiatric: She has a normal mood and affect.  Somewhat somnolent but pleasant  Seems comfortable today Supportive family are present  Answers questions appropriately          Assessment & Plan:   Problem List Items Addressed This Visit      Cardiovascular and Mediastinum   Pulmonary hypertension ([place])    This has been relatively stable - though pt is not very active at this time       Chronic diastolic heart failure ([place])    This has been relatively stable throughout last hospitalization  Continues prn lasix        Nervous and Auditory   Meningioma of cerebellum ([place])    Found while hospitalized for a severe headache Not a surgical candidate-was evaluated by dr Christella Noa  Treating with decadron -on taper at home  Do not plan on MRI  Pain is controlled with oxycontin (oxycocone for breakthrough)  Some ataxia and sleepiness from the tumor and medications  Plan to refer for hospice care-interested in comfort care at this time Reviewed hospital notes and studies with pt and family in detail          Musculoskeletal and Integument   Osteoarthritis of both knees    She takes tramadol for this but may not need it with current oxycontin and oxycodone  Family will assess her needs It does add to her sedation       Relevant Medications   oxyCODONE (OXYCONTIN) 10 mg 12 hr tablet     Genitourinary   Renal insufficiency - Primary    CKD stage 3 Lab Results  Component Value Date   CREATININE 1.20 (H) 08/11/2016    Enc her to keep fluids up        Other   Mobility impaired    Worsened now by ataxia from cerebllar meningioma    Needs assist with ambulation and 24 hour care       Hyperglycemia    Expect worsening with decadron for the meningioma  Expect to be on this for a month (taper) Family will find out what brand of DM supplies are covered and call - will need blood sugar control once we know what her glucose levels are doing       History of pulmonary embolism    She continue xarelto      Bad headache    From cerebellar meningioma that is inoperable  Controlled with oxycontin and oxycodone for breakthrough       Relevant Medications   oxyCODONE (OXYCONTIN) 10 mg 12 hr tablet    Other Visit Diagnoses   None.

## 2016-08-16 NOTE — Telephone Encounter (Signed)
Please ok that verbal order  

## 2016-08-16 NOTE — Progress Notes (Signed)
Pre visit review using our clinic review tool, if applicable. No additional management support is needed unless otherwise documented below in the visit note. 

## 2016-08-17 DIAGNOSIS — I447 Left bundle-branch block, unspecified: Secondary | ICD-10-CM | POA: Diagnosis not present

## 2016-08-17 DIAGNOSIS — N183 Chronic kidney disease, stage 3 (moderate): Secondary | ICD-10-CM | POA: Diagnosis not present

## 2016-08-17 DIAGNOSIS — T380X5D Adverse effect of glucocorticoids and synthetic analogues, subsequent encounter: Secondary | ICD-10-CM | POA: Diagnosis not present

## 2016-08-17 DIAGNOSIS — E1122 Type 2 diabetes mellitus with diabetic chronic kidney disease: Secondary | ICD-10-CM | POA: Diagnosis not present

## 2016-08-17 DIAGNOSIS — M6281 Muscle weakness (generalized): Secondary | ICD-10-CM | POA: Diagnosis not present

## 2016-08-17 DIAGNOSIS — E1165 Type 2 diabetes mellitus with hyperglycemia: Secondary | ICD-10-CM | POA: Diagnosis not present

## 2016-08-17 DIAGNOSIS — I130 Hypertensive heart and chronic kidney disease with heart failure and stage 1 through stage 4 chronic kidney disease, or unspecified chronic kidney disease: Secondary | ICD-10-CM | POA: Diagnosis not present

## 2016-08-17 DIAGNOSIS — M170 Bilateral primary osteoarthritis of knee: Secondary | ICD-10-CM | POA: Diagnosis not present

## 2016-08-17 DIAGNOSIS — G43909 Migraine, unspecified, not intractable, without status migrainosus: Secondary | ICD-10-CM | POA: Diagnosis not present

## 2016-08-17 DIAGNOSIS — E1151 Type 2 diabetes mellitus with diabetic peripheral angiopathy without gangrene: Secondary | ICD-10-CM | POA: Diagnosis not present

## 2016-08-17 DIAGNOSIS — I5032 Chronic diastolic (congestive) heart failure: Secondary | ICD-10-CM | POA: Diagnosis not present

## 2016-08-17 DIAGNOSIS — D320 Benign neoplasm of cerebral meninges: Secondary | ICD-10-CM | POA: Diagnosis not present

## 2016-08-17 DIAGNOSIS — M542 Cervicalgia: Secondary | ICD-10-CM | POA: Diagnosis not present

## 2016-08-17 NOTE — Assessment & Plan Note (Signed)
From cerebellar meningioma that is inoperable  Controlled with oxycontin and oxycodone for breakthrough

## 2016-08-17 NOTE — Assessment & Plan Note (Signed)
She takes tramadol for this but may not need it with current oxycontin and oxycodone  Family will assess her needs It does add to her sedation

## 2016-08-17 NOTE — Assessment & Plan Note (Signed)
CKD stage 3 Lab Results  Component Value Date   CREATININE 1.20 (H) 08/11/2016    Enc her to keep fluids up

## 2016-08-17 NOTE — Assessment & Plan Note (Signed)
This has been relatively stable - though pt is not very active at this time

## 2016-08-17 NOTE — Assessment & Plan Note (Addendum)
Found while hospitalized for a severe headache Not a surgical candidate-was evaluated by dr Christella Noa  Treating with decadron -on taper at home  Do not plan on MRI  Pain is controlled with oxycontin (oxycocone for breakthrough)  Some ataxia and sleepiness from the tumor and medications  Plan to refer for hospice care-interested in comfort care at this time Reviewed hospital notes and studies with pt and family in detail

## 2016-08-17 NOTE — Assessment & Plan Note (Signed)
Worsened now by ataxia from cerebllar meningioma   Needs assist with ambulation and 24 hour care

## 2016-08-17 NOTE — Assessment & Plan Note (Signed)
She continue xarelto

## 2016-08-17 NOTE — Assessment & Plan Note (Signed)
This has been relatively stable throughout last hospitalization  Continues prn lasix

## 2016-08-17 NOTE — Assessment & Plan Note (Signed)
Expect worsening with decadron for the meningioma  Expect to be on this for a month (taper) Family will find out what brand of DM supplies are covered and call - will need blood sugar control once we know what her glucose levels are doing

## 2016-08-19 ENCOUNTER — Other Ambulatory Visit: Payer: Self-pay | Admitting: *Deleted

## 2016-08-19 MED ORDER — GLUCOSE BLOOD VI STRP: ORAL_STRIP | 1 refills | Status: AC

## 2016-08-19 MED ORDER — ACCU-CHEK AVIVA PLUS W/DEVICE KIT: PACK | 0 refills | Status: AC

## 2016-08-19 MED ORDER — ACCU-CHEK AVIVA VI SOLN: 1 refills | Status: AC

## 2016-08-19 MED ORDER — ACCU-CHEK SOFTCLIX LANCETS MISC: 1 refills | Status: AC

## 2016-08-21 DIAGNOSIS — K529 Noninfective gastroenteritis and colitis, unspecified: Secondary | ICD-10-CM | POA: Diagnosis not present

## 2016-08-21 DIAGNOSIS — M6281 Muscle weakness (generalized): Secondary | ICD-10-CM | POA: Diagnosis not present

## 2016-08-23 ENCOUNTER — Telehealth: Payer: Self-pay | Admitting: *Deleted

## 2016-08-23 ENCOUNTER — Telehealth: Payer: Self-pay

## 2016-08-23 NOTE — Telephone Encounter (Signed)
Hospice nurse Mariel Kansky notified of Dr. Marliss Coots comments/ instructions and verbalized understanding. The will d/c the HCTZ, and they will attempt to get a UA but pt uses depends so it may be difficult to get but they will try and they have their own lab they send the urine sample too. Jan will also talk to the family to see where they sand on dialing back pain meds to see if it helps her sedation

## 2016-08-23 NOTE — Telephone Encounter (Signed)
Hold the hctz please Can they get a UA or get a specimen here to check for uti ?  It is feasible that the meningioma may be progressing quickly- it is hard to know. ? Possibly should we dial back the pain med or valium a bit - is this causing her sedation ?  Let me know what they think

## 2016-08-23 NOTE — Telephone Encounter (Signed)
Received records from Hospice and Oxycontin was on med list so I called and Hospice advise me that since she is a Hospice pt now that on their end the Oxycontin is covered and we don't need to do anything further

## 2016-08-23 NOTE — Telephone Encounter (Signed)
Great, thanks

## 2016-08-23 NOTE — Telephone Encounter (Signed)
Mariel Kansky with [facility] has some concerns; has seen significant change in pt. 08/19/16 pt was relatively alert, slight sleepy. Pt slept all weekend;pt did wake up to eat.since weekend pt taking Valium 2 mg bid and oxycodone 10 mg at bedtime. (previously pt had been taking oxycodone bid). On 08/19/16 BP 110/62 and P 56 but today BP 98/64 and P 75. Jan said when she started her visit today pt had to be awakened but once awakened was totally coherent and alert.No meter to ck BS. Family wants to know if meningioma could be affecting pt this quickly. When pt voids urine has foul odor. And Jan wants to know if pt should continue taking the HCTZ since BP is down today. Jan request cb.

## 2016-08-23 NOTE — Telephone Encounter (Signed)
Please let family know - she has to fail one of many other choices- do they want me to pick for them ?  I will keep the form on my desk

## 2016-08-23 NOTE — Telephone Encounter (Signed)
PA for pt's oxycontin was denied due to pt needing to try a list of alt meds. Denial letter placed in Dr. Marliss Coots inbox and the alt meds are listed on the 1st page

## 2016-08-25 ENCOUNTER — Encounter: Payer: Self-pay | Admitting: Family Medicine

## 2016-08-26 MED ORDER — CIPROFLOXACIN HCL 250 MG PO TABS: 250.0000 mg | ORAL_TABLET | Freq: Two times a day (BID) | ORAL | 0 refills | Status: DC

## 2016-08-26 NOTE — Addendum Note (Signed)
Addended by: [name] A on: 08/26/2016 01:24 PM   Modules accepted: Orders

## 2016-08-26 NOTE — Telephone Encounter (Signed)
Ellen Peterson with [facility] left v/m; preliminary results are in and have been faxed to Dr Glori Bickers in case Dr Glori Bickers wants to start med prior to weekend. Call Jan with Hospice and she will contact family. [place].

## 2016-08-26 NOTE — Telephone Encounter (Signed)
Urine looks moderately positive with bacteria and wbc  Please call in low dose cipro  Ask if they can send it for culture  Encourage fluids as well  thanks

## 2016-08-26 NOTE — Telephone Encounter (Signed)
Spoke with Jan with Hospice and they did order a culture already. I gave the verbal order for the cipro and they will take care of getting meds to pt. The will encourage water intake

## 2016-08-29 ENCOUNTER — Other Ambulatory Visit: Payer: Self-pay | Admitting: Family Medicine

## 2016-08-29 NOTE — Progress Notes (Signed)
Daughter let me know that she thinks she is allergic to cipro now  Took off her list and added to all list  Pending urine culture from hospice to decide what to order

## 2016-08-31 ENCOUNTER — Encounter: Payer: Self-pay | Admitting: Cardiology

## 2016-08-31 ENCOUNTER — Telehealth: Payer: Self-pay

## 2016-08-31 MED ORDER — SULFAMETHOXAZOLE-TRIMETHOPRIM 200-40 MG/5ML PO SUSP: 20.0000 mL | Freq: Two times a day (BID) | ORAL | 0 refills | Status: AC

## 2016-08-31 NOTE — Telephone Encounter (Signed)
Yes- I got it yesterday and had talked to her daughter earlier- aware that she thought she was allergic to cipro (and I added it to her med all list)  She mentioned that despite the fact sulfa was on her all list - she took it since then with no problems  I gave the ok to px septra D (I believe) and put it in the in box  Thanks

## 2016-08-31 NOTE — Telephone Encounter (Signed)
Mariel Kansky nurse director with [facility] left v/m; cipro had been ordered last week and Jan said pt's daughter had said the last time pt took cipro she had rash; Dr Glori Bickers wanted to wait on urine culture to decide what abx for pt to take. Jan wants to know if Dr Glori Bickers has seen C&S which came in 08/30/16. Jan request cb. [place].

## 2016-08-31 NOTE — Telephone Encounter (Signed)
I sent liquid septra to take for 5 days to her pharmacy Do not send in the pills Thanks   As for the scalp pain - if at all possible watch for rash (? Shingles)  Unsure if this could be referred pain from her tumor Give pain medicine if needed   Thanks for keeping me updated

## 2016-08-31 NOTE — Telephone Encounter (Signed)
Finishing up dexamethasone and now refusing to swallow larger pills. Also complains of her scalp hurting and will not allow anyone to touch it to examine it or even wash it. Asks for liquid abx to assist with swallowing.

## 2016-09-02 NOTE — Telephone Encounter (Signed)
Left voicemail on Jan's phone letting her know Rx was sent and advise her of Dr. Marliss Coots comments

## 2016-09-12 ENCOUNTER — Telehealth: Payer: Self-pay | Admitting: Cardiology

## 2016-09-12 NOTE — Telephone Encounter (Signed)
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  Per the daughter, she wanted to discuss which meds the pt should remain on from a cardiac perspective, and she wanted to cancel the pts appt that is scheduled for this Thursday.  Per the daughter, Hospice  Protocol is for comfort care measures and meds only. Asked the daughter for the Hospice RN contact information to endorse discontinued meds for the pt, per Dr Meda Coffee.   Per the daughter, Mary Sella is her Hospice RN and her contact information is [phone].  Informed the pts daughter that I will reach out to Jan with Dr Francesca Oman recommendations of discontinuation of the pts cardiac meds.  Daughter verbalized understanding and agrees with this plan.

## 2016-09-12 NOTE — Telephone Encounter (Signed)
New Message  Pt daughter call requesting to speak with RN. Pt daughter call to cancel appt stating pt is no longer is able to walk. Pt  Daughter states hospice has stepped in. Pt daughter wants to know if it would be possible if pt would still recieve medication. Please call back to discuss

## 2016-09-12 NOTE — Telephone Encounter (Signed)
Contacted Jan RN with Hospice to inform her of Dr Francesca Oman recommendations of discontinued cardiac meds for this pt, due to transition into their care.  Informed Jan RN that per Dr Meda Coffee, she recommends that we discontinue the pts Imdur, Losartan, HCTZ, and Xarelto.  Provided these verbal orders to Jan RN, per Dr Meda Coffee.  Jan will fax over verbal orders taken by our office at [phone] and attention to Dr Meda Coffee to review and sign off and refax back.  Jan RN, verbalized understanding and agrees with this plan.  Jan RN states she will discuss in full to the pts family tomorrow, of d/c'ed meds.

## 2016-09-15 ENCOUNTER — Ambulatory Visit: Payer: Medicare Other | Admitting: Cardiology

## 2016-09-23 ENCOUNTER — Telehealth: Payer: Self-pay | Admitting: Family Medicine

## 2016-09-23 NOTE — Telephone Encounter (Signed)
Please ok that verbal order.  If they need it faxed please print it and have me sign it.  Thanks

## 2016-09-23 NOTE — Telephone Encounter (Signed)
Ellen Peterson called because the pt needs a flu shot but is too ill to come into the office.  She is requesting an order for the flu shot to be administered at the home.  Can you please follow up with her at [phone].  Thanks!

## 2016-09-23 NOTE — Telephone Encounter (Signed)
This Probation officer spoke with [facility] nurse Almyra Free and gave verbal okay to order and administer flu shot per Dr. Marliss Coots recommendation.  Almyra Free states they do not need the order in writing, the verbal will suffice.

## 2016-09-30 ENCOUNTER — Telehealth: Payer: Self-pay

## 2016-09-30 NOTE — Telephone Encounter (Signed)
Thanks for the update, aware

## 2016-09-30 NOTE — Telephone Encounter (Signed)
Almyra Free with [facility] left v/m; pt will be going to [facility] place for 5 night respite stay starting 10/02/16. No cb needed. FYI to Dr Glori Bickers.

## 2016-10-12 ENCOUNTER — Telehealth: Payer: Self-pay

## 2016-10-12 NOTE — Telephone Encounter (Signed)
Almyra Free with [facility] left v/m; pts diazepam was stopped in 08/2016 and family wants to know if can restart valium; pt is starting to have h/a and some agitation. Family wants to know if could do valium 1 mg because the 2 mg puts pt to sleep. Almyra Free request cb.

## 2016-10-13 MED ORDER — DIAZEPAM 2 MG PO TABS: 1.0000 mg | ORAL_TABLET | Freq: Four times a day (QID) | ORAL | 1 refills | Status: AC | PRN

## 2016-10-13 NOTE — Telephone Encounter (Signed)
I wrote it for call in - unsure if you need a px - I do not see valium 1 mg in epic- I wrote for the 2 mg to give 1/2 to 1 pill Q6 hours prn for call in   If they need something different please alert me

## 2016-10-13 NOTE — Telephone Encounter (Signed)
Spoke with Ellen Peterson with Hospice and advise her of Dr. Marliss Coots comments, Ellen Peterson requested we just call in Rx to [facility], so Rx called in as prescribed

## 2016-11-03 ENCOUNTER — Telehealth: Payer: Self-pay | Admitting: *Deleted

## 2016-11-03 NOTE — Telephone Encounter (Signed)
Almyra Free RN with Hospice left voicemail at Triage, she wanted [place] to let us know pt is going to [facility] from 11/14/16 - 11/19/16, for a 5 night Respite stay. Just an FYI to Dr. Theodore Demark we get any calls or orders during that time from [facility] we are aware

## 2016-11-03 NOTE — Telephone Encounter (Signed)
Aware-thanks for the heads up

## 2016-12-08 ENCOUNTER — Telehealth: Payer: Self-pay | Admitting: Family Medicine

## 2016-12-19 NOTE — Telephone Encounter (Signed)
Kathy with [facility] left v/m that pt was pronounced 01/01/2017 at 12:57 AM. We already had call from [place] that time of death was 2:57. I called Hospice and spoke with Horris Latino and time of death was 12.57 AM on 01-01-2017.

## 2016-12-19 NOTE — Telephone Encounter (Signed)
I spoke to her daughter Donia Guiles family is doing well overall/ this was expected.  Good support. They took terrific care of her.  The funeral is to be sat at [employer]

## 2016-12-19 NOTE — Telephone Encounter (Signed)
Hospice called to let Dr.Tower know patient passed away on 12/13/16 at 2:57 am at home.

## 2016-12-19 DEATH — deceased

## 2017-03-24 IMAGING — CR DG HIP (WITH OR WITHOUT PELVIS) 2-3V*L*
3 series · 3 of 3 positions shown · non-contrast
Comparison: None.

CLINICAL DATA: Pain left groin.

EXAM:
DG HIP (WITH OR WITHOUT PELVIS) 2-3V LEFT

[view not recorded (1 of 3)]
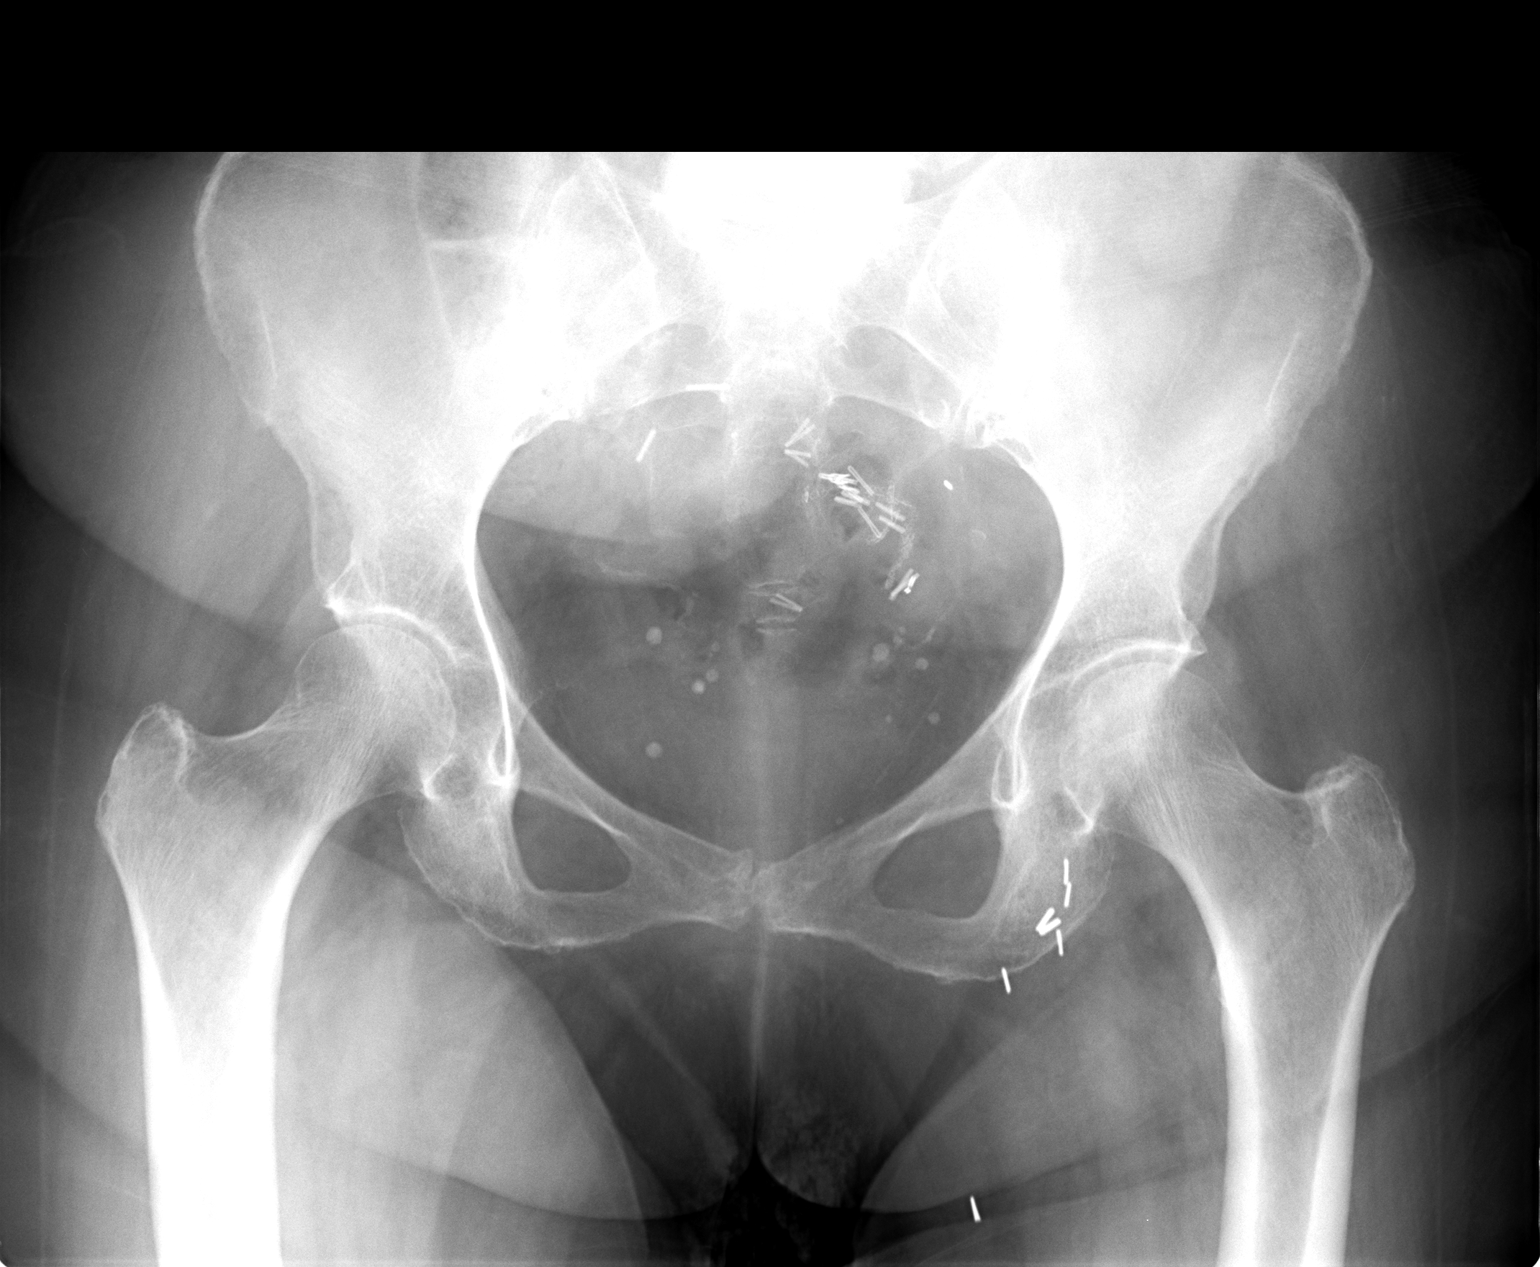

[view not recorded (2 of 3)]
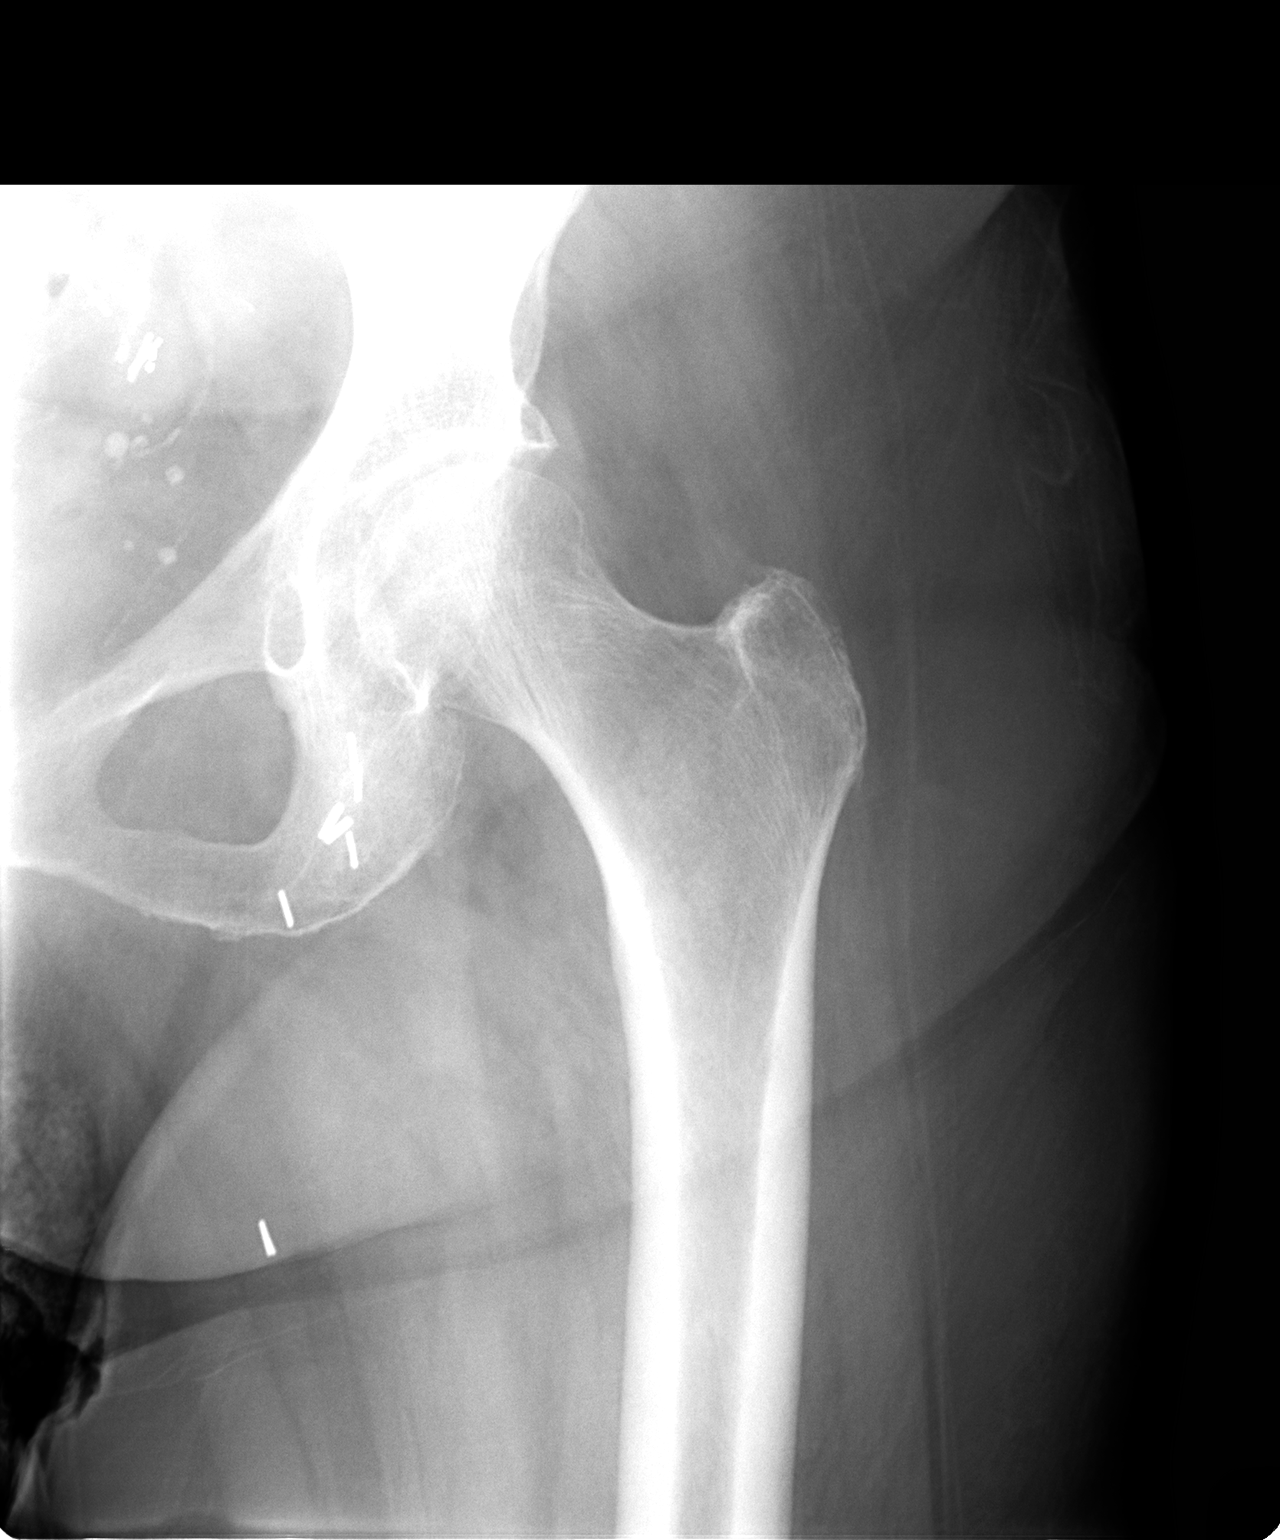

[view not recorded (3 of 3)]
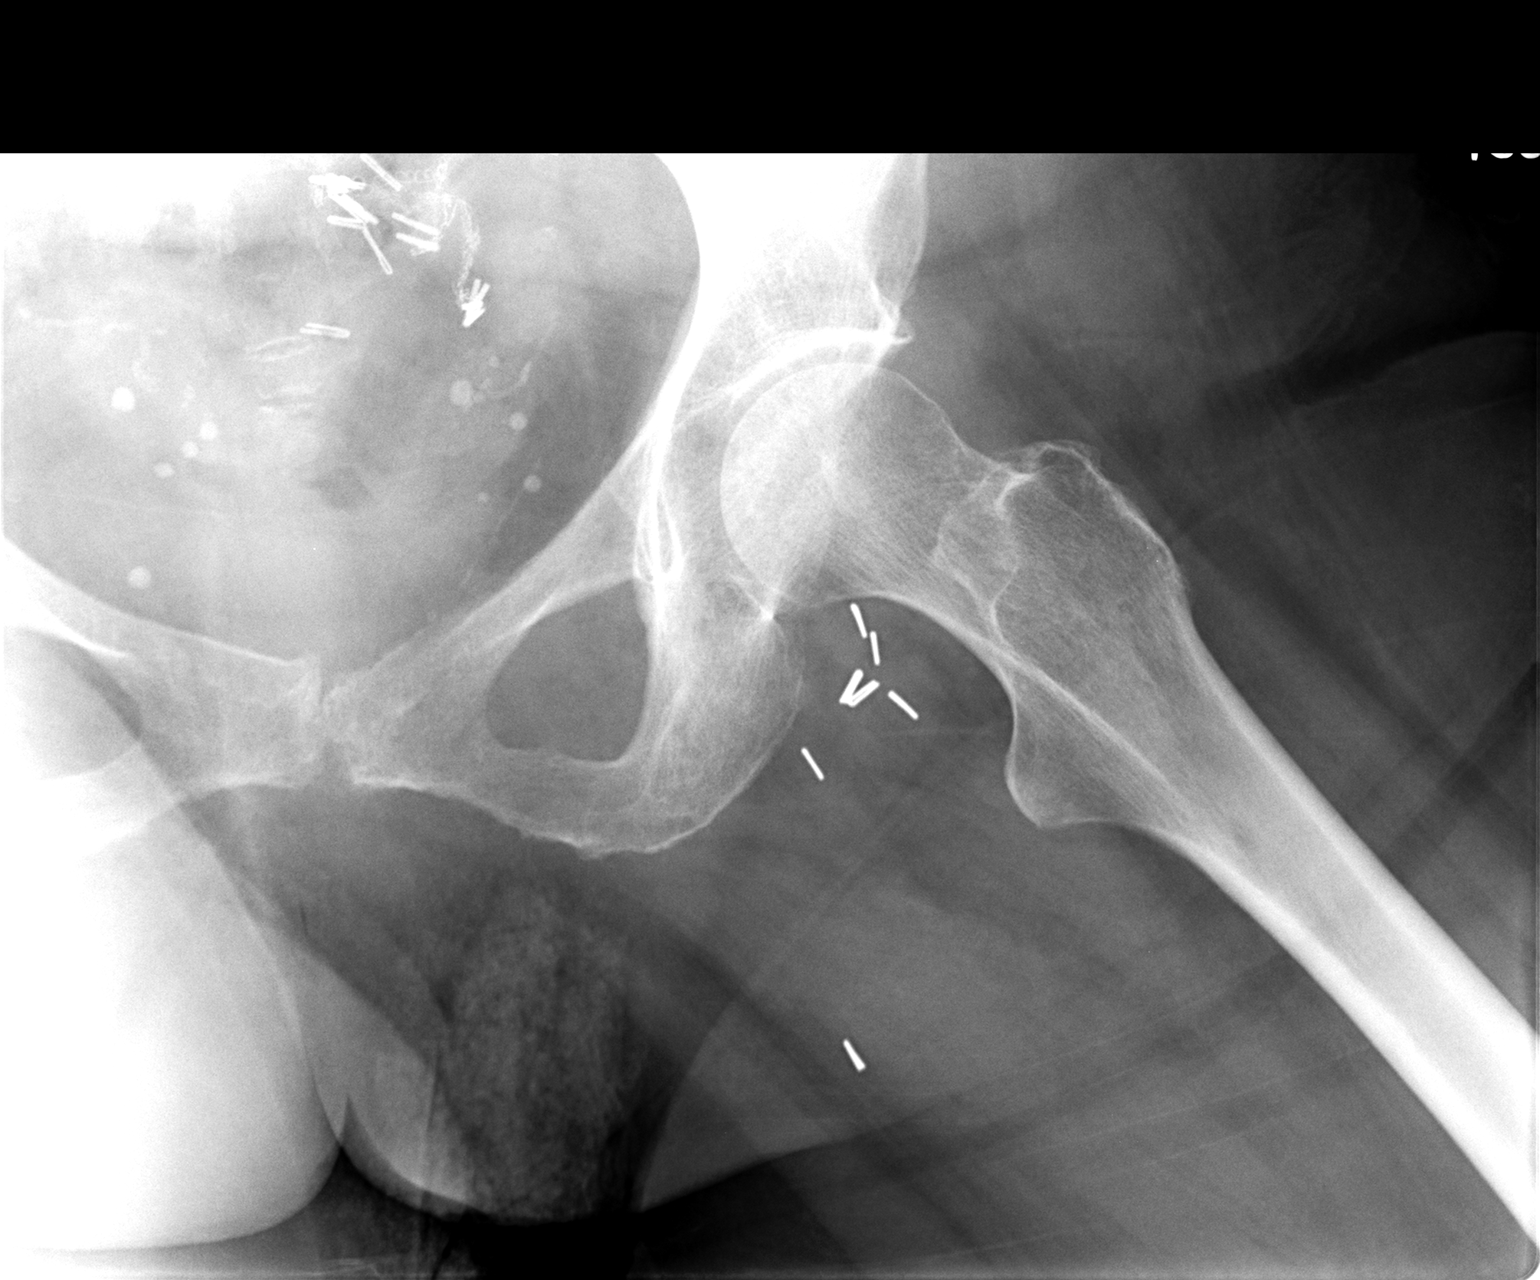

[3 of 3 positions shown; findings below may reference images not displayed]

FINDINGS: Degenerative changes lumbar spine and both hips. No acute bony or
joint abnormality identified. No evidence of fracture dislocation.
Pelvic calcifications are present consistent phleboliths. Pelvic
clips and sutures are noted.
IMPRESSION: No acute abnormality. Degenerative changes lumbar spine and both
hips.

## 2017-05-07 IMAGING — DX DG CHEST 2V
2 series · 2 of 2 positions shown · non-contrast
Comparison: 11/30/2015

CLINICAL DATA: Recent pneumonia

EXAM:
CHEST  2 VIEW

[chest pa]
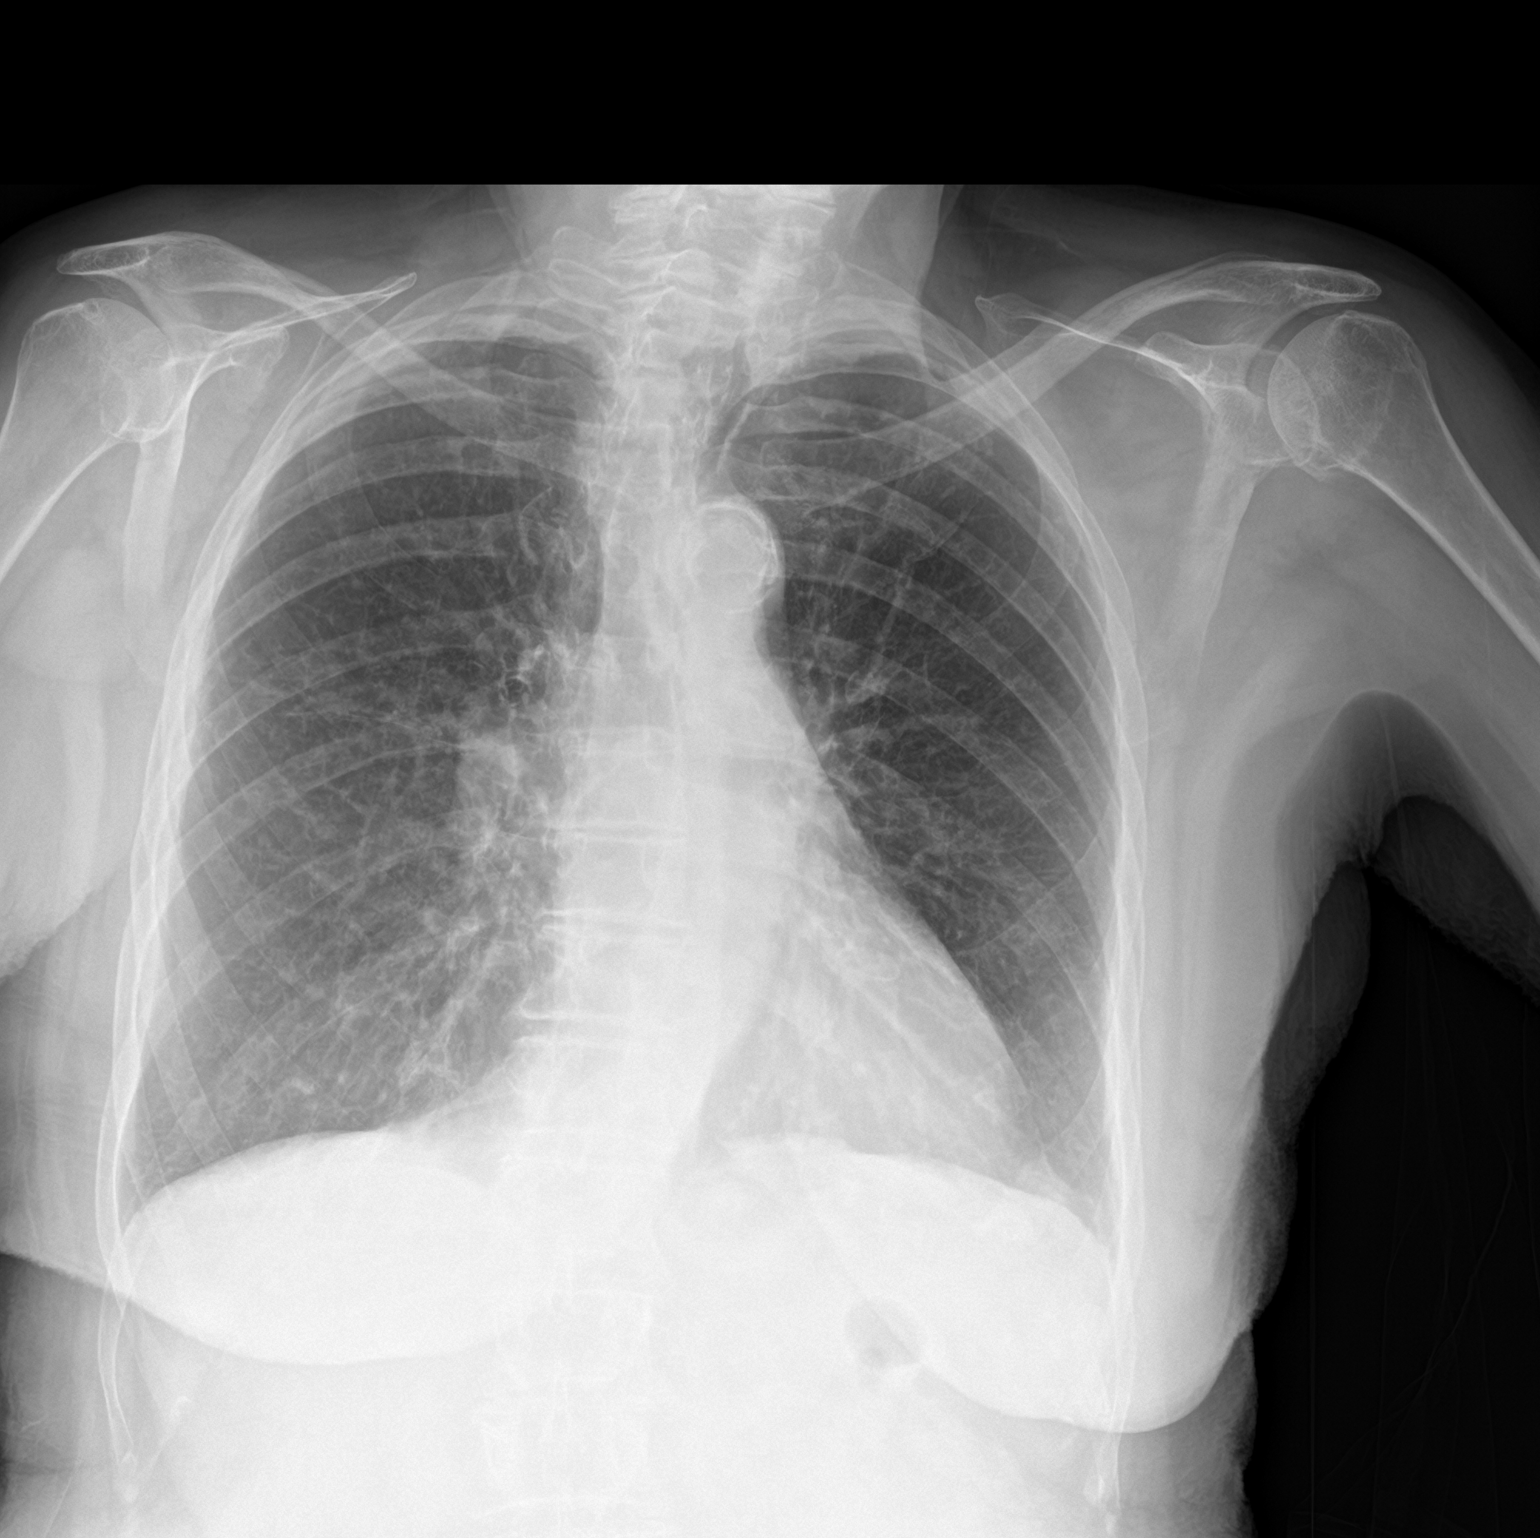

[chest lat]
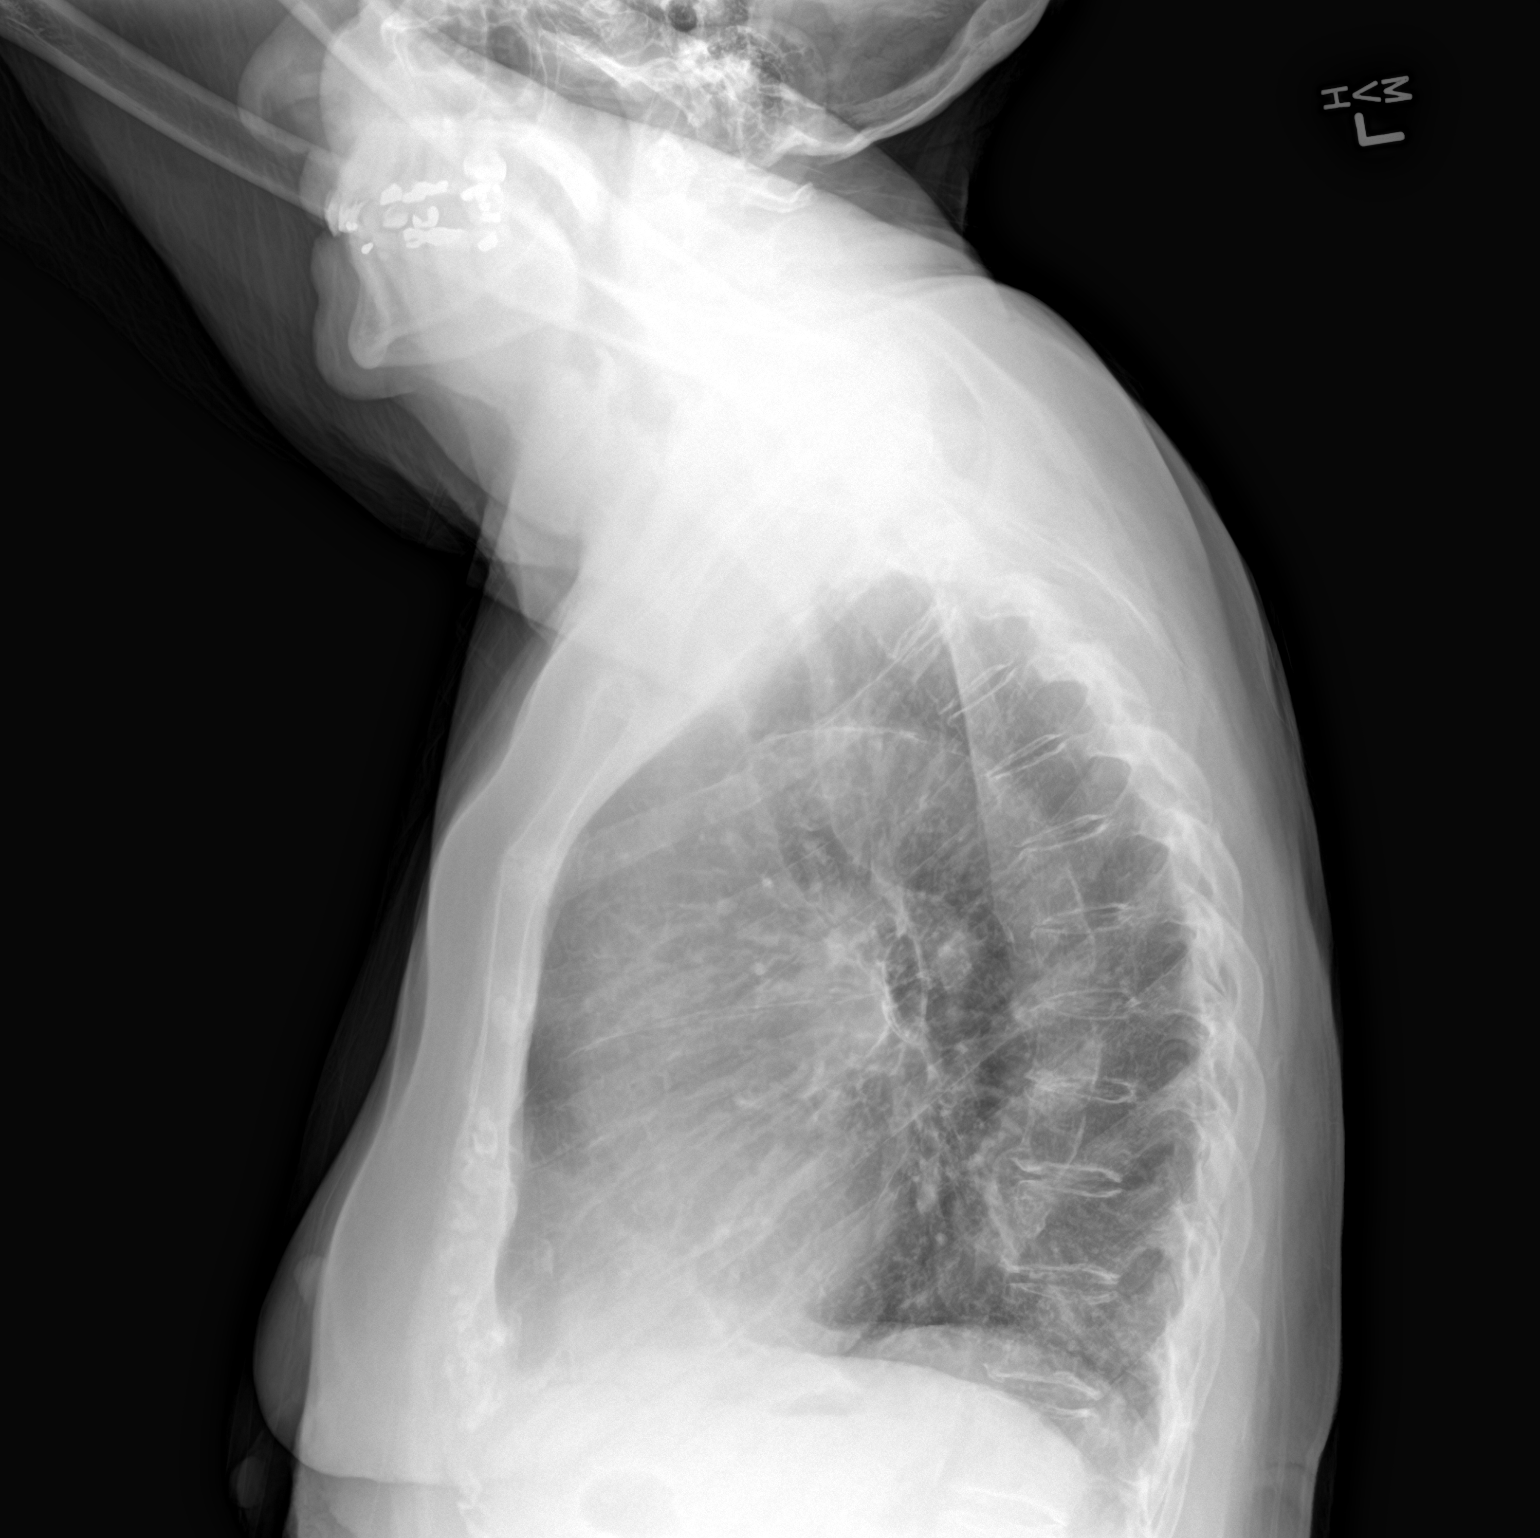

[2 of 2 positions shown; findings below may reference images not displayed]

FINDINGS: Interval clearing of right upper lobe infiltrate. No evidence of
recurrent pneumonia

Prominent lung markings suggesting chronic lung disease which is
mild. Apical scarring bilaterally. Lung volume normal. Negative for
heart failure or effusion. Atherosclerotic aortic arch.
IMPRESSION: Interval resolution of right upper lobe infiltrate.

Prominent lung markings suggesting chronic lung disease.
